# Patient Record
Sex: Female | Born: 1990 | Race: Black or African American | Hispanic: No | Marital: Married | Smoking: Former smoker
Health system: Southern US, Community
[De-identification: ages and names within clinical notes are randomized; demographics above are authoritative.]

## PROBLEM LIST (undated history)

## (undated) DIAGNOSIS — L659 Nonscarring hair loss, unspecified: Secondary | ICD-10-CM

## (undated) DIAGNOSIS — G43909 Migraine, unspecified, not intractable, without status migrainosus: Secondary | ICD-10-CM

## (undated) HISTORY — DX: Migraine, unspecified, not intractable, without status migrainosus: G43.909

## (undated) HISTORY — PX: OTHER SURGICAL HISTORY: SHX169

## (undated) HISTORY — DX: Nonscarring hair loss, unspecified: L65.9

## (undated) HISTORY — PX: NO PAST SURGERIES: SHX2092

---
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
## 2008-01-13 ENCOUNTER — Encounter: Admission: RE | Admit: 2008-01-13 | Discharge: 2008-01-13 | Payer: Self-pay | Admitting: Family Medicine

## 2008-01-13 IMAGING — CR DG ABDOMEN 1V
2 series · 2 of 2 positions shown · non-contrast
Comparison: None

CLINICAL DATA: Abdominal pain and constipation

ABDOMEN - 1 VIEW

[t abdomen supine (1 of 2)]
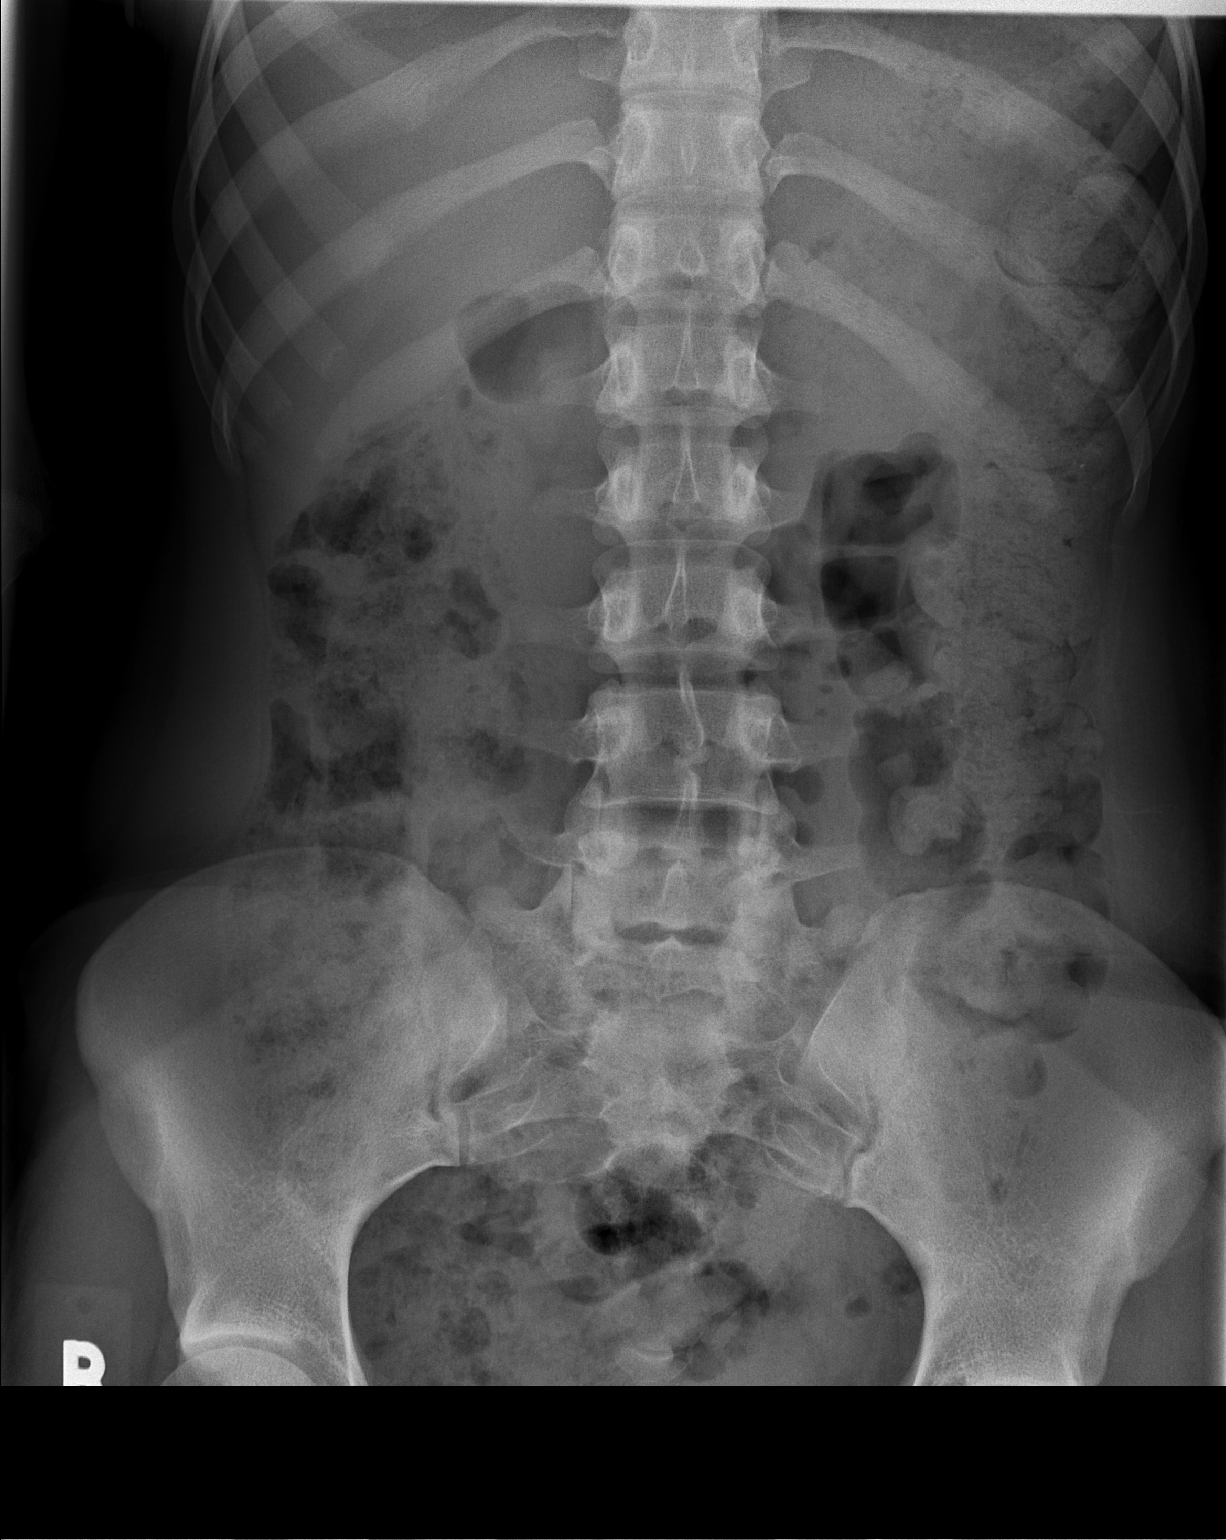

[t abdomen supine (2 of 2)]
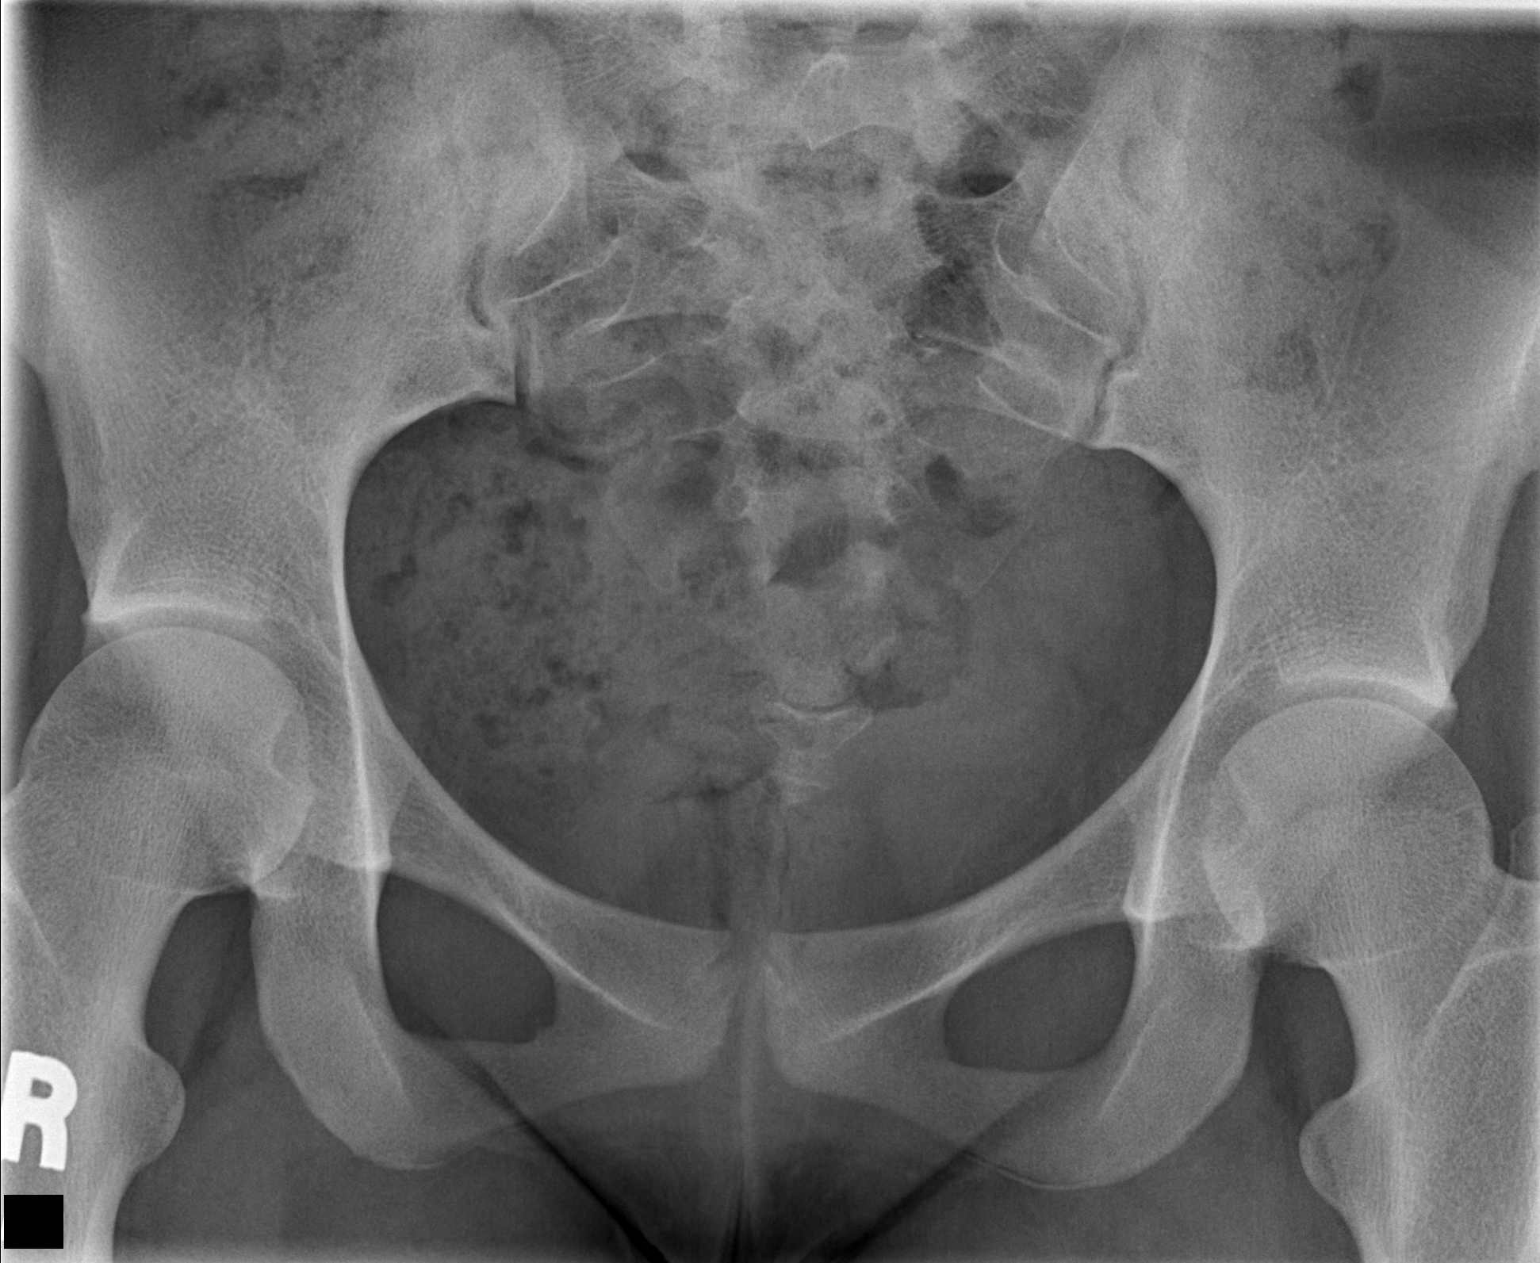

[2 of 2 positions shown; findings below may reference images not displayed]

FINDINGS: There is a moderate fecal burden identified within the
colon.

No abnormally dilated loops of small bowel or air-fluid levels
noted.

No radiopaque foreign bodies or soft tissue calcifications noted.
IMPRESSION: .
1.  Moderate fecal burden within the colon correlates with the
clinical history of constipation.
2.  No evidence for bowel obstruction

## 2009-03-29 ENCOUNTER — Inpatient Hospital Stay (HOSPITAL_COMMUNITY): Admission: AD | Admit: 2009-03-29 | Discharge: 2009-03-29 | Payer: Self-pay | Admitting: Obstetrics & Gynecology

## 2009-04-16 LAB — HM PAP SMEAR: HM Pap smear: NORMAL

## 2009-10-05 ENCOUNTER — Emergency Department (HOSPITAL_COMMUNITY): Admission: EM | Admit: 2009-10-05 | Discharge: 2009-10-05 | Payer: Self-pay | Admitting: Family Medicine

## 2010-01-14 ENCOUNTER — Inpatient Hospital Stay (HOSPITAL_COMMUNITY): Admission: AD | Admit: 2010-01-14 | Discharge: 2010-01-15 | Payer: Self-pay | Admitting: Obstetrics & Gynecology

## 2010-01-14 ENCOUNTER — Ambulatory Visit: Payer: Self-pay | Admitting: Obstetrics & Gynecology

## 2010-01-14 IMAGING — US US OB COMP LESS 14 WK
1 series · 13 of 28 positions shown · non-contrast
Comparison: None

CLINICAL DATA: Early pregnancy, bleeding, cramping; quantitative
beta HCG [3Q]

OBSTETRIC <14 WK US AND TRANSVAGINAL OB US
TECHNIQUE: Both transabdominal and transvaginal ultrasound
examinations were performed for complete evaluation of the
gestation as well as the maternal uterus, adnexal regions, and
pelvic cul-de-sac.  Transvaginal technique was performed to assess
early pregnancy.

[Series 1: us ob comp less 14 wks · 0.19mm/px · 13 of 41 slices shown]
[im 2/41]
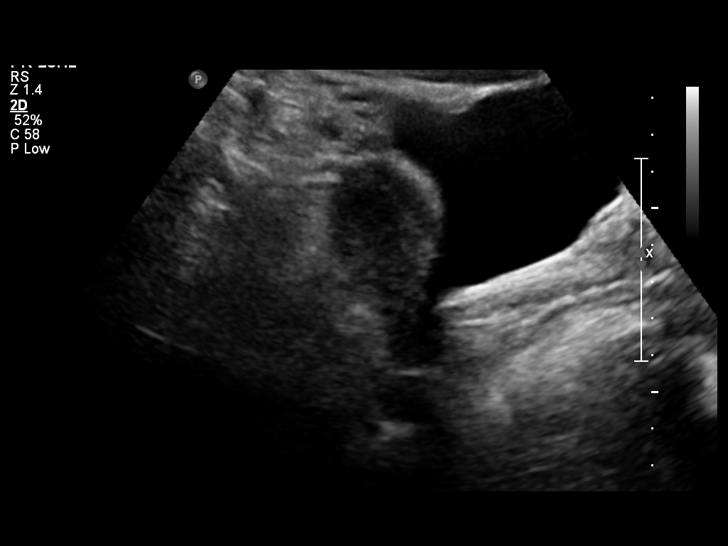
[im 5/41]
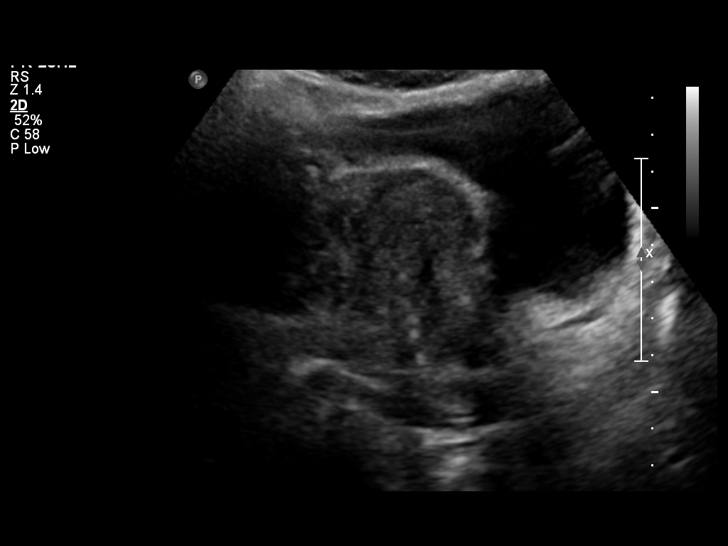
[im 8/41]
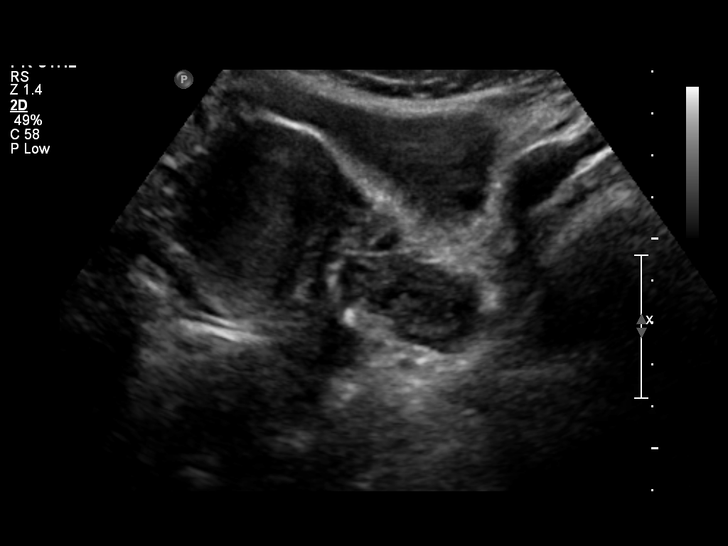
[im 11/41]
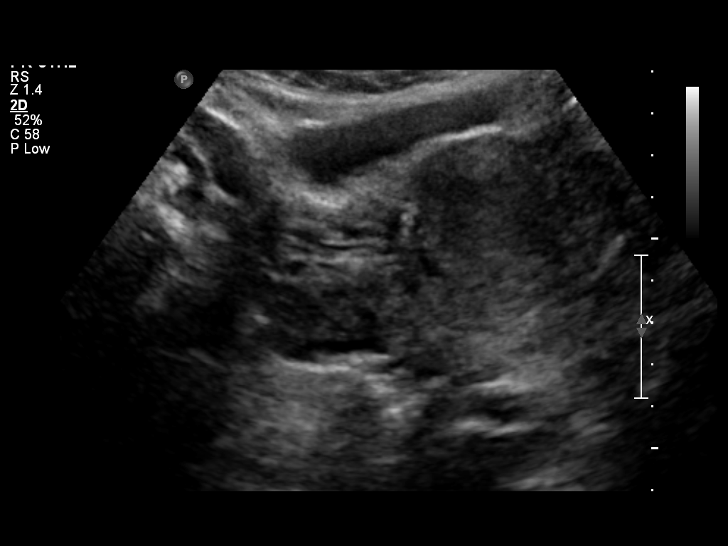
[im 14/41]
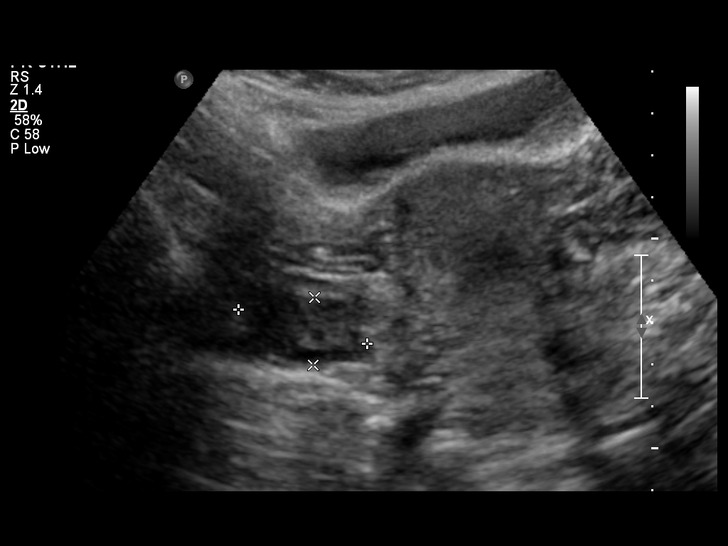
[im 17/41]
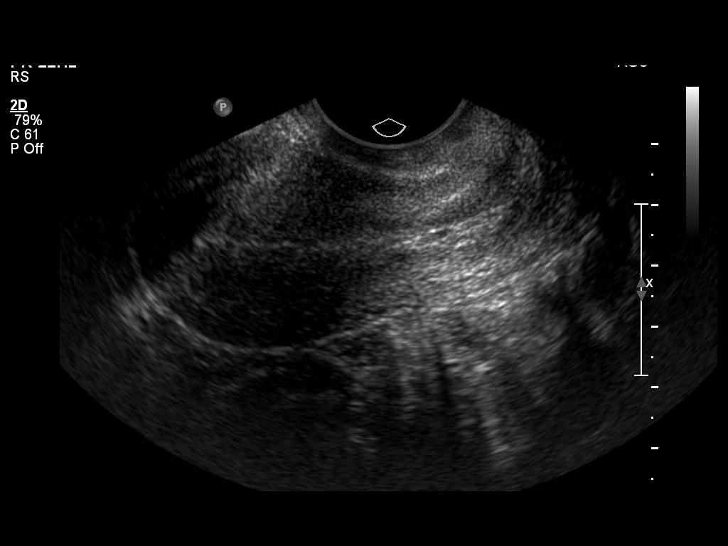
[im 21/41]
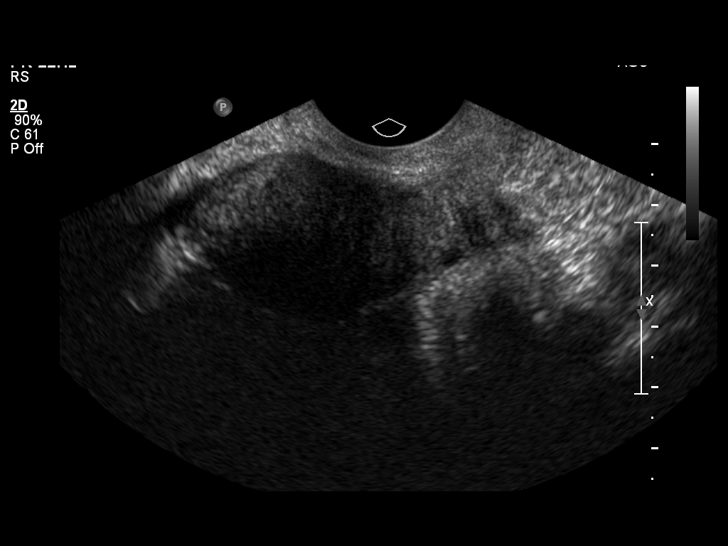
[im 24/41]
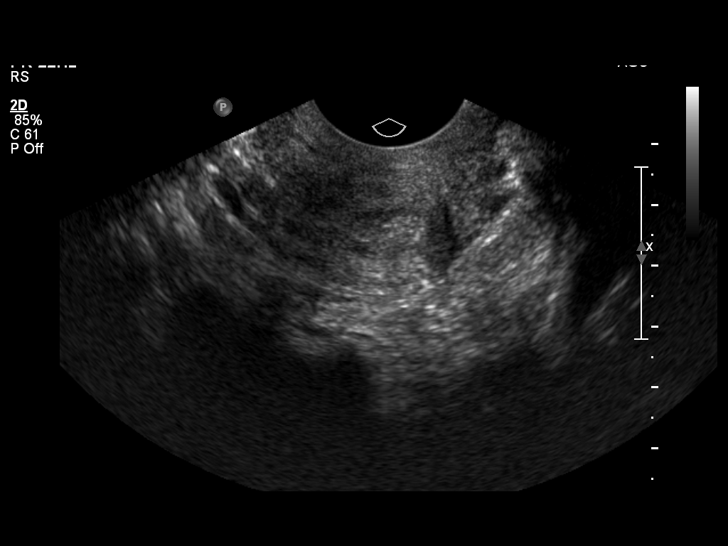
[im 27/41]
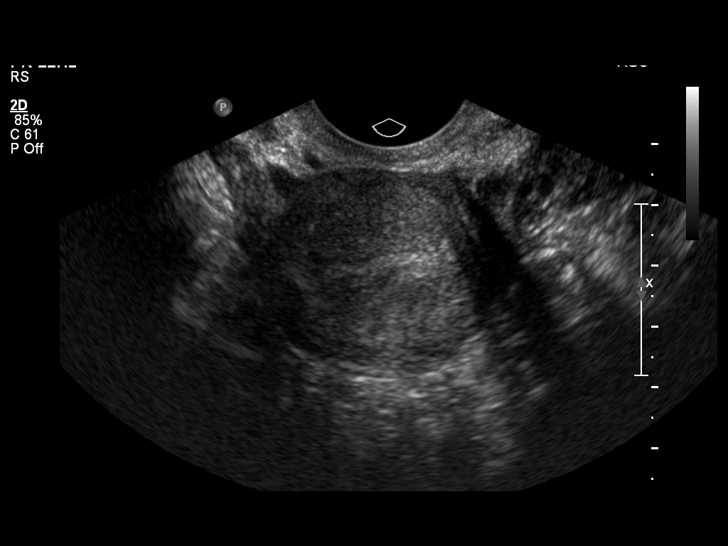
[im 30/41]
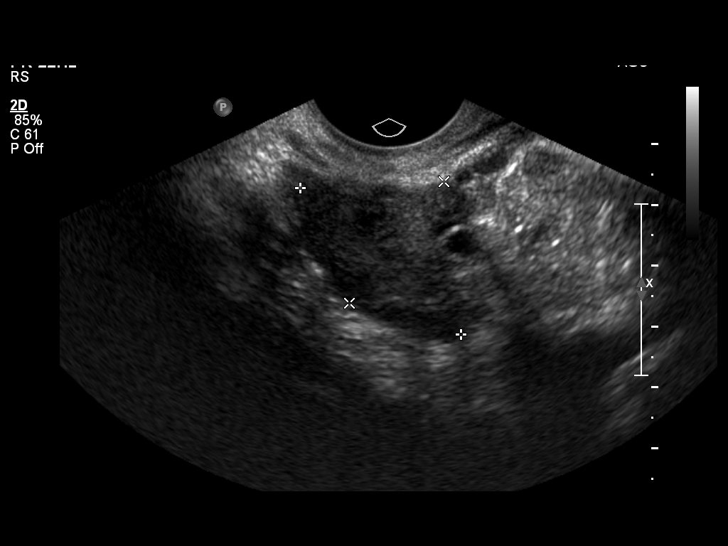
[im 33/41]
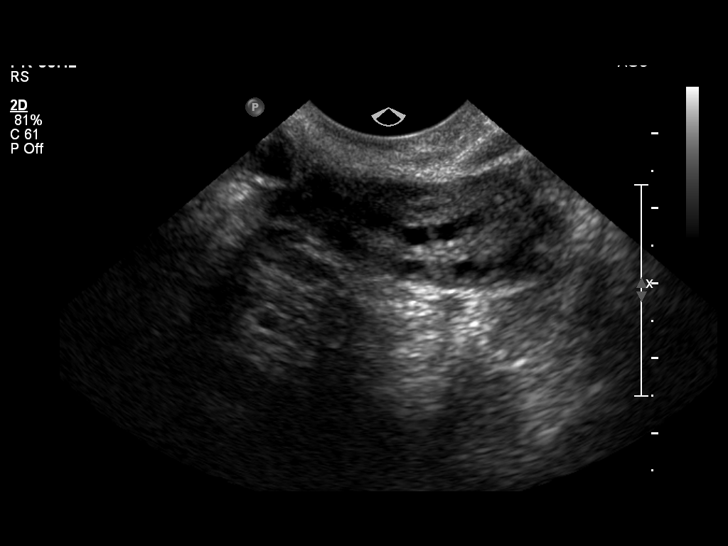
[im 36/41]
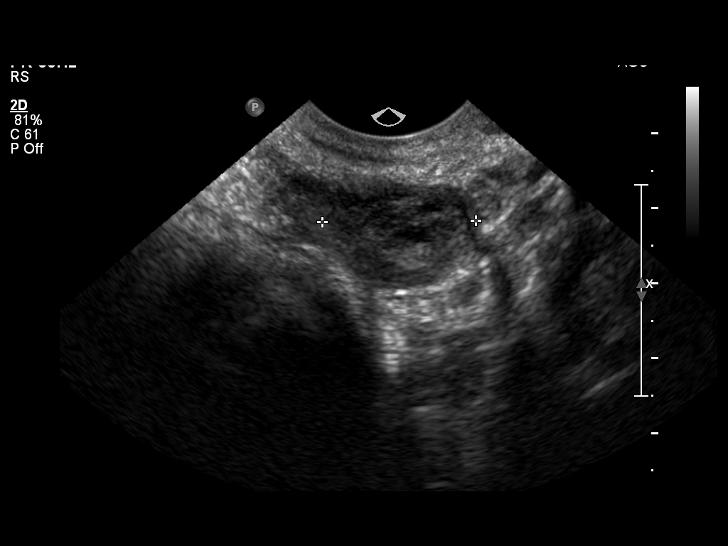
[im 39/41]
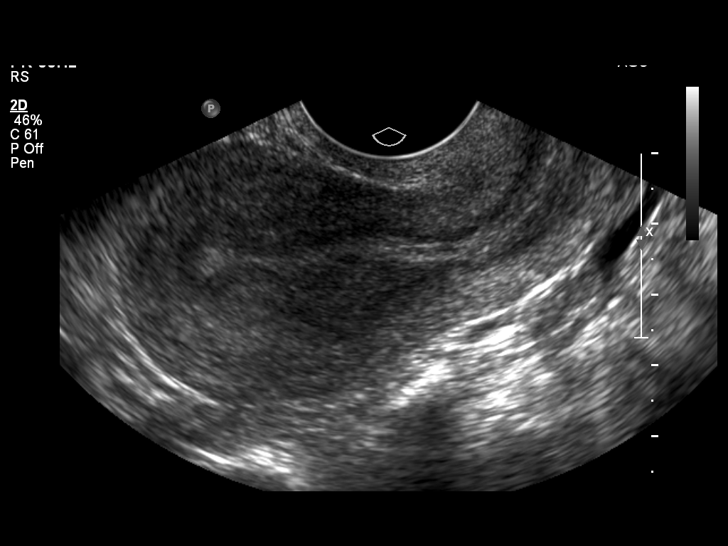

[13 of 28 positions shown; findings below may reference images not displayed]

Intrauterine gestational sac:  Not identified
Yolk sac: Not identify
Embryo: None not identified
Cardiac Activity: N/A
Heart Rate: N/A bpm

Maternal uterus/adnexae:
Endometrial complex 5 mm thick.
Small amount of hypoechoic material within endometrial stripe,
question blood.
No gestational sac identified.
Left ovary normal size and morphology, 3.3 x 1.5 x 2.1 cm.
Right ovary normal size and morphology, 3.6 x 2.5 x 2.2 cm.
No adnexal masses.
Trace free pelvic fluid.
IMPRESSION: No intrauterine gestation identified.
With a quantitative beta HCG level of [3Q], differential diagnosis
includes spontaneous abortion and ectopic pregnancy.
Serial quantitative beta HCG and/or follow-up ultrasound
recommended to exclude ectopic pregnancy.

Findings called to [facility] in [facility] on [DATE] at [3Q] hours.

## 2010-01-17 ENCOUNTER — Ambulatory Visit: Payer: Self-pay | Admitting: Obstetrics & Gynecology

## 2010-01-17 ENCOUNTER — Inpatient Hospital Stay (HOSPITAL_COMMUNITY): Admission: AD | Admit: 2010-01-17 | Discharge: 2010-01-17 | Payer: Self-pay | Admitting: Obstetrics & Gynecology

## 2010-02-14 DEATH — deceased

## 2010-04-16 ENCOUNTER — Emergency Department (HOSPITAL_COMMUNITY)
Admission: EM | Admit: 2010-04-16 | Discharge: 2010-04-17 | Payer: Self-pay | Source: Home / Self Care | Admitting: Emergency Medicine

## 2010-04-17 IMAGING — US US ART/VEN ABD/PELV/SCROTUM DOPPLER COMPLETE
1 series · 13 of 25 positions shown · non-contrast
Comparison: None.

CLINICAL DATA: Left-sided pelvic pain.  Clinical concern for
ovarian torsion.

TRANSABDOMINAL AND TRANSVAGINAL ULTRASOUND OF PELVIS
DOPPLER ULTRASOUND OF OVARIES
TECHNIQUE: Both transabdominal and transvaginal ultrasound
examinations of the pelvis were performed including evaluation of
the uterus, ovaries, adnexal regions, and pelvic cul-de-sac.
Transabdominal technique was performed for global imaging of the
pelvis.  Transvaginal technique was performed for detailed
evaluation of the endometrium and/or ovaries.  Color and duplex
Doppler ultrasound was utilized to evaluate blood flow to the
ovaries.

[Series 1: us art/ven abd/pelv/scrotum doppler complete · 0.23mm/px · 13 of 58 slices shown]
[im 1/58]
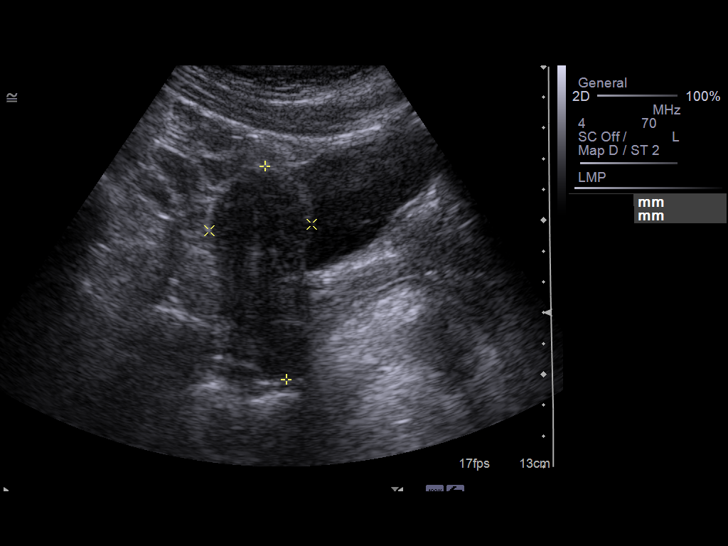
[im 5/58]
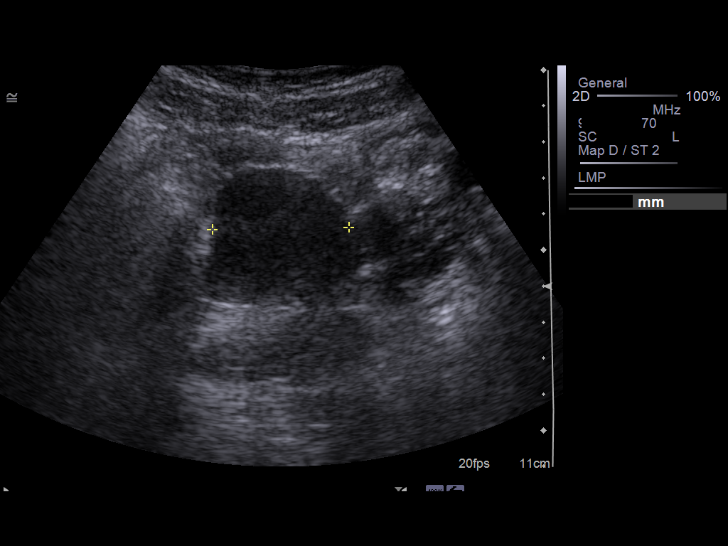
[im 10/58]
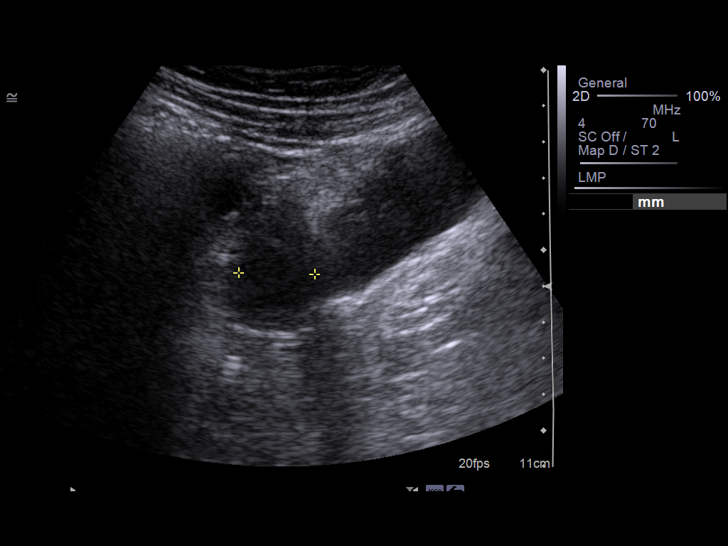
[im 15/58]
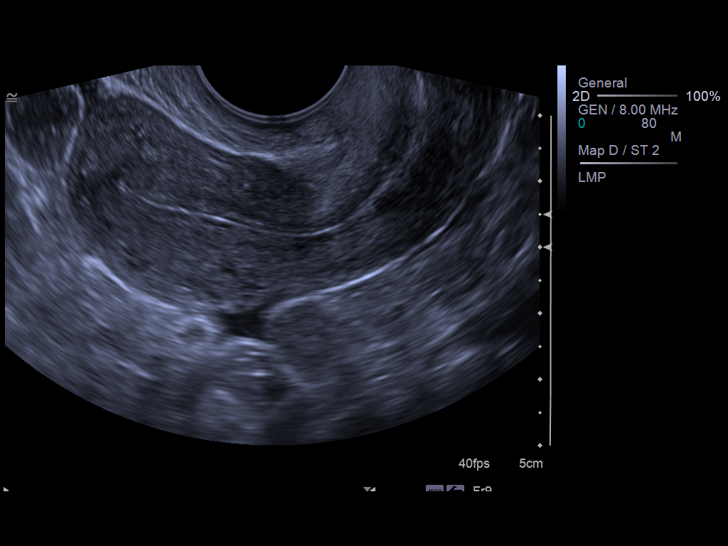
[im 20/58]
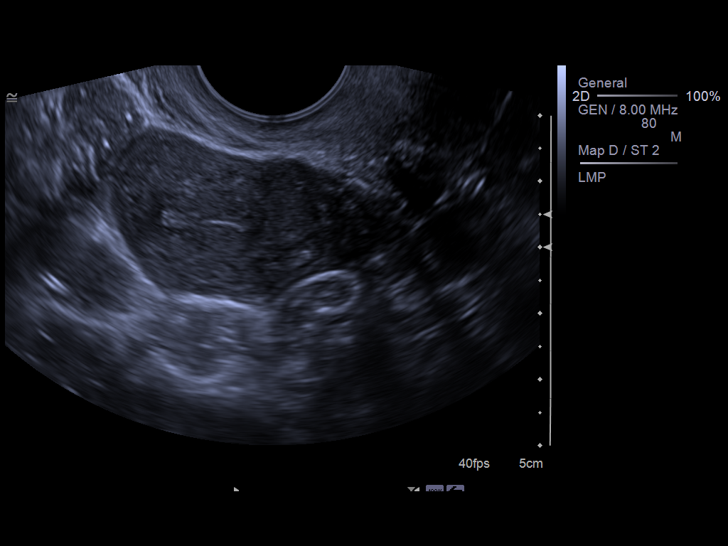
[im 24/58]
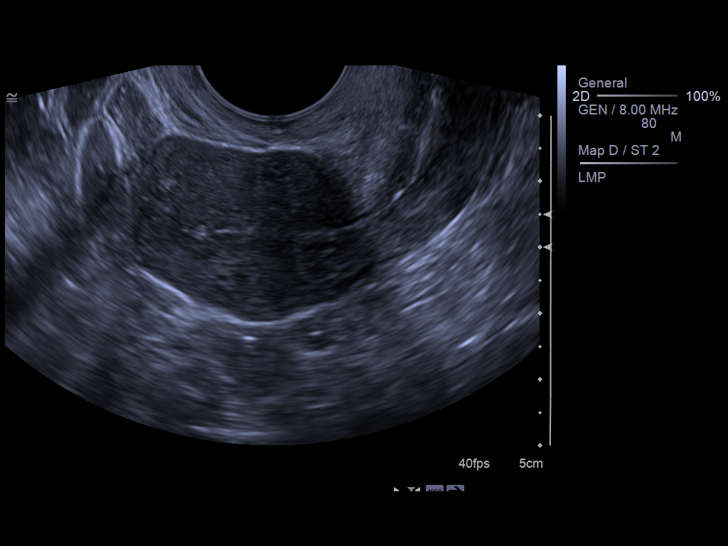
[im 29/58]
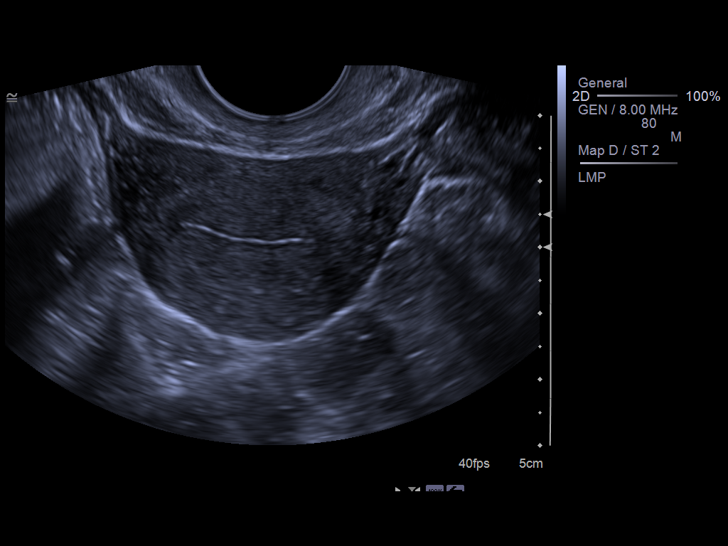
[im 34/58]
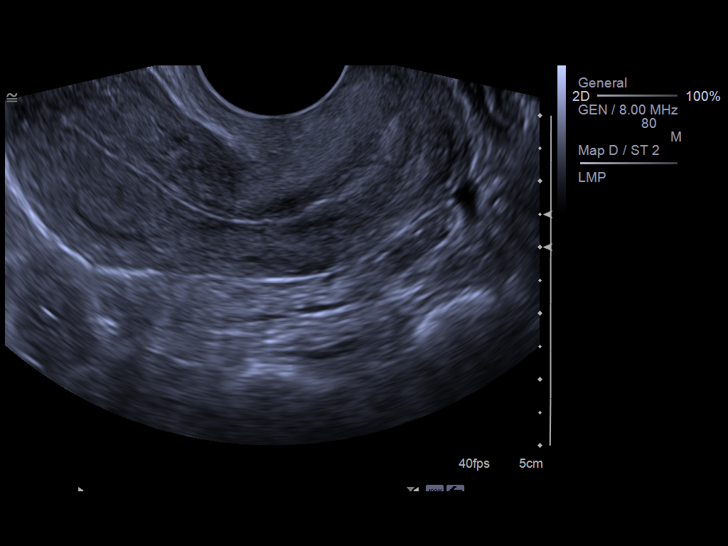
[im 39/58]
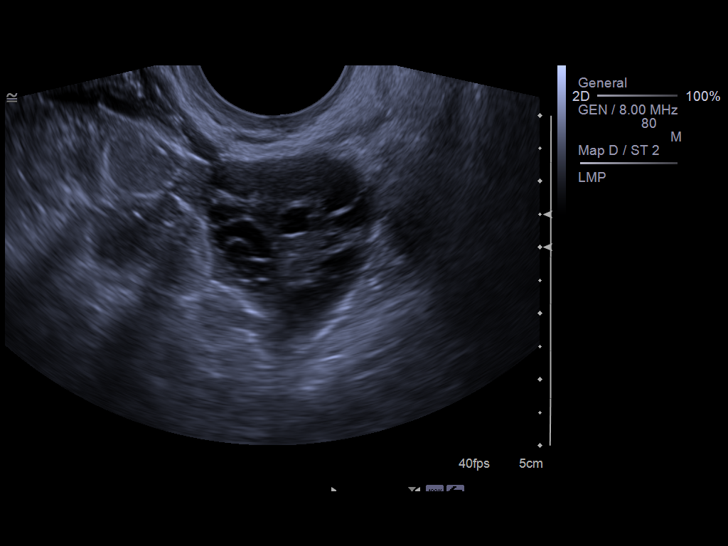
[im 43/58]
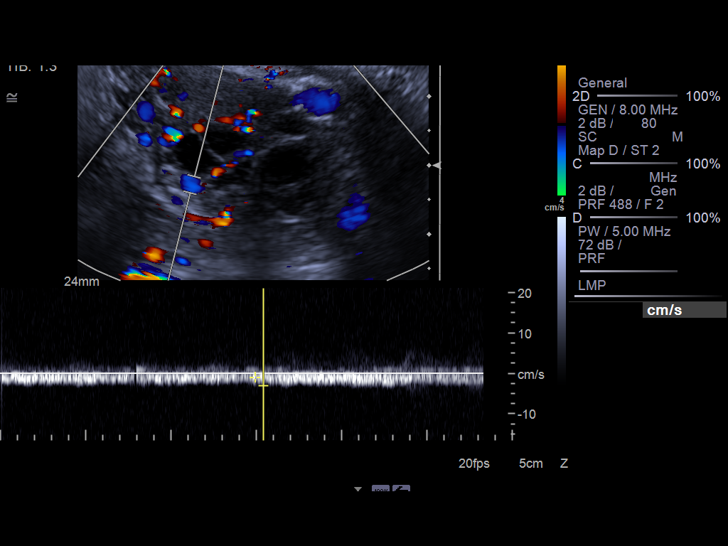
[im 48/58]
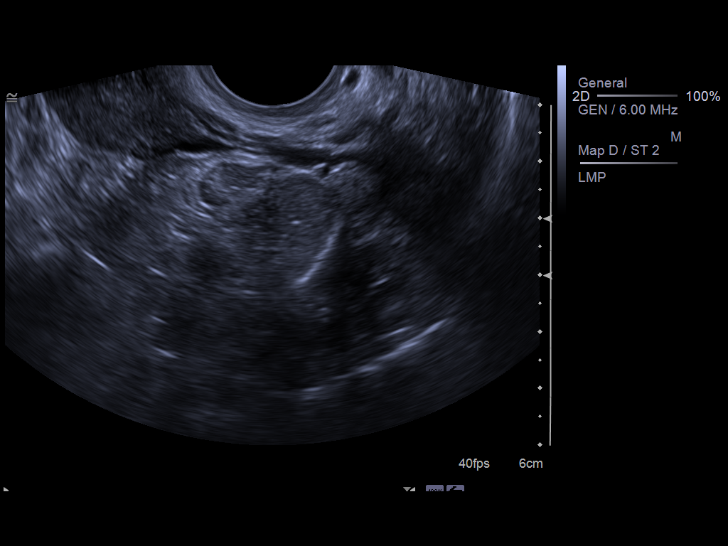
[im 53/58]
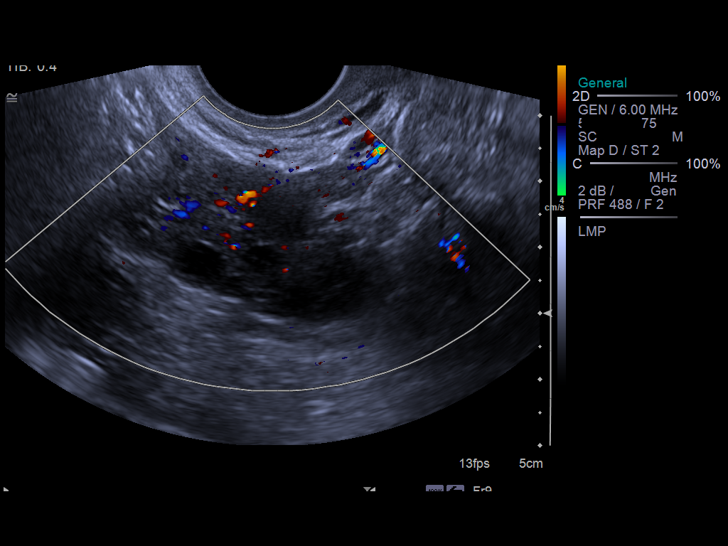
[im 58/58]
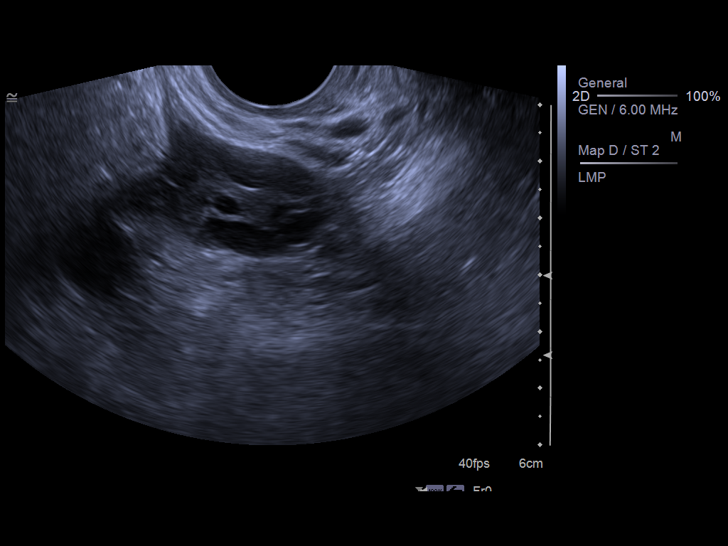

[13 of 25 positions shown; findings below may reference images not displayed]

FINDINGS: Uterus 7.0 x 4.0 x 2.8 cm.  Anteverted, anteflexed.  No focal
abnormality.

Endometrium 1 mm.  Uniformly thin and echogenic.

Right Ovary 3.6 x 2.2 x 1.7 cm.  Normal.  Low resistance arterial
and venous wave forms are noted on pulsed Doppler interrogation.

Left Ovary 2.4 x 2.9 x 2.5 cm.  Normal.  Low resistance arterial
and venous wave forms are identified on pulsed Doppler
interrogation.

Other Findings:  Small free fluid incidentally noted in the cul-de-
sac.
IMPRESSION: Normal exam.  No sonographic evidence for ovarian torsion.

## 2010-04-19 ENCOUNTER — Emergency Department (HOSPITAL_COMMUNITY)
Admission: EM | Admit: 2010-04-19 | Discharge: 2010-04-19 | Payer: Self-pay | Source: Home / Self Care | Admitting: Emergency Medicine

## 2010-05-30 ENCOUNTER — Other Ambulatory Visit: Payer: Self-pay | Admitting: Obstetrics and Gynecology

## 2010-06-26 LAB — URINALYSIS, ROUTINE W REFLEX MICROSCOPIC
Bilirubin Urine: NEGATIVE
Ketones, ur: NEGATIVE mg/dL
Leukocytes, UA: NEGATIVE
Nitrite: NEGATIVE
Protein, ur: NEGATIVE mg/dL
Specific Gravity, Urine: 1.011 (ref 1.005–1.030)
Urobilinogen, UA: 1 mg/dL (ref 0.0–1.0)
pH: 7.5 (ref 5.0–8.0)

## 2010-06-26 LAB — URINE MICROSCOPIC-ADD ON

## 2010-06-26 LAB — PREGNANCY, URINE: Preg Test, Ur: NEGATIVE

## 2010-06-29 LAB — URINALYSIS, ROUTINE W REFLEX MICROSCOPIC
Bilirubin Urine: NEGATIVE
Glucose, UA: NEGATIVE mg/dL
Ketones, ur: 15 mg/dL — AB
Leukocytes, UA: NEGATIVE
Nitrite: NEGATIVE
Protein, ur: NEGATIVE mg/dL
Specific Gravity, Urine: 1.025 (ref 1.005–1.030)
Urobilinogen, UA: 0.2 mg/dL (ref 0.0–1.0)
pH: 6 (ref 5.0–8.0)

## 2010-06-29 LAB — WET PREP, GENITAL
Clue Cells Wet Prep HPF POC: NONE SEEN
Trich, Wet Prep: NONE SEEN
Yeast Wet Prep HPF POC: NONE SEEN

## 2010-06-29 LAB — URINE MICROSCOPIC-ADD ON

## 2010-06-29 LAB — CBC
HCT: 36 % (ref 36.0–46.0)
Hemoglobin: 12 g/dL (ref 12.0–15.0)
MCH: 29.4 pg (ref 26.0–34.0)
MCHC: 33.3 g/dL (ref 30.0–36.0)
MCV: 88.3 fL (ref 78.0–100.0)
Platelets: 206 10*3/uL (ref 150–400)
RBC: 4.08 MIL/uL (ref 3.87–5.11)
RDW: 12.6 % (ref 11.5–15.5)
WBC: 4.9 10*3/uL (ref 4.0–10.5)

## 2010-06-29 LAB — GC/CHLAMYDIA PROBE AMP, GENITAL
Chlamydia, DNA Probe: NEGATIVE
GC Probe Amp, Genital: NEGATIVE

## 2010-06-29 LAB — HCG, QUANTITATIVE, PREGNANCY
hCG, Beta Chain, Quant, S: 234 m[IU]/mL — ABNORMAL HIGH (ref <5)
hCG, Beta Chain, Quant, S: 3368 m[IU]/mL — ABNORMAL HIGH (ref <5)

## 2010-06-29 LAB — ABO/RH: ABO/RH(D): O POS

## 2010-06-29 LAB — POCT PREGNANCY, URINE: Preg Test, Ur: POSITIVE

## 2010-07-02 LAB — POCT URINALYSIS DIP (DEVICE)
Bilirubin Urine: NEGATIVE
Glucose, UA: NEGATIVE mg/dL
Ketones, ur: NEGATIVE mg/dL
Nitrite: NEGATIVE
Protein, ur: NEGATIVE mg/dL
Specific Gravity, Urine: <=1.005 (ref 1.005–1.030)
Urobilinogen, UA: 1 mg/dL (ref 0.0–1.0)
pH: 6 (ref 5.0–8.0)

## 2010-07-02 LAB — POCT RAPID STREP A (OFFICE): Streptococcus, Group A Screen (Direct): NEGATIVE

## 2010-08-20 ENCOUNTER — Emergency Department (HOSPITAL_COMMUNITY)
Admission: EM | Admit: 2010-08-20 | Discharge: 2010-08-20 | Disposition: A | Discharge status: Home / Self Care | Payer: BC Managed Care – PPO | Attending: Emergency Medicine | Admitting: Emergency Medicine

## 2010-08-20 DIAGNOSIS — R209 Unspecified disturbances of skin sensation: Secondary | ICD-10-CM | POA: Insufficient documentation

## 2011-03-01 DIAGNOSIS — L6681 Central centrifugal cicatricial alopecia: Secondary | ICD-10-CM | POA: Insufficient documentation

## 2011-03-01 DIAGNOSIS — L668 Other cicatricial alopecia: Secondary | ICD-10-CM | POA: Insufficient documentation

## 2011-03-01 DIAGNOSIS — L089 Local infection of the skin and subcutaneous tissue, unspecified: Secondary | ICD-10-CM | POA: Insufficient documentation

## 2011-07-07 ENCOUNTER — Encounter (HOSPITAL_COMMUNITY): Payer: Self-pay | Admitting: Emergency Medicine

## 2011-07-07 ENCOUNTER — Emergency Department (HOSPITAL_COMMUNITY)
Admission: EM | Admit: 2011-07-07 | Discharge: 2011-07-07 | Disposition: A | Discharge status: Home / Self Care | Payer: BC Managed Care – PPO | Source: Home / Self Care | Attending: Family Medicine | Admitting: Family Medicine

## 2011-07-07 DIAGNOSIS — M545 Low back pain: Secondary | ICD-10-CM

## 2011-07-07 LAB — POCT URINALYSIS DIP (DEVICE)
Glucose, UA: NEGATIVE mg/dL
Leukocytes, UA: NEGATIVE
Nitrite: NEGATIVE
Protein, ur: 30 mg/dL — AB
Specific Gravity, Urine: 1.025 (ref 1.005–1.030)
Urobilinogen, UA: 2 mg/dL — ABNORMAL HIGH (ref 0.0–1.0)
pH: 6 (ref 5.0–8.0)

## 2011-07-07 LAB — POCT PREGNANCY, URINE: Preg Test, Ur: NEGATIVE

## 2011-07-07 MED ORDER — NAPROXEN 375 MG PO TABS: 375.0000 mg | ORAL_TABLET | Freq: Two times a day (BID) | ORAL | Status: DC

## 2011-07-07 NOTE — ED Provider Notes (Signed)
History     CSN: [id number]  Arrival date & time 07/07/11  2536   First MD Initiated Contact with Patient 07/07/11 [phone]      Chief Complaint  Patient presents with  . Back Pain    (Consider location/radiation/quality/duration/timing/severity/associated sxs/prior treatment) HPI Comments: Elizabeth Williamson presents for evaluation of acute low back pain over the last 3 days. She denies any injury. 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 She denies any numbness, tingling, weakness, or bowel or bladder incontinence. She describes the pain as dull and achy and localized to her mid lower back. It is not exacerbated nor improved by anything. She is not taking any medication for this.  Patient is a 21 y.o. female presenting with back pain. The history is provided by the patient.  Back Pain  This is a new problem. The current episode started more than 2 days ago. The problem occurs constantly. The problem has not changed since onset.The pain is associated with no known injury. The pain is present in the lumbar spine. The quality of the pain is described as aching. The pain does not radiate. The pain is moderate. The symptoms are aggravated by bending and certain positions. The pain is the same all the time. Pertinent negatives include no numbness, no bowel incontinence, no bladder incontinence, no paresthesias, no paresis, no tingling and no weakness. She has tried nothing for the symptoms.    History reviewed. No pertinent past medical history.  History reviewed. No pertinent past surgical history.  History reviewed. No pertinent family history.  History  Substance Use Topics  . Smoking status: Current Some Day Smoker    Types: Cigarettes  . Smokeless tobacco: Not on file  . Alcohol Use: Yes     Occasional social drinker    OB History    Grav Para Term Preterm Abortions TAB SAB Ect Mult Living                  Review of Systems  Constitutional: Negative.   HENT:  Negative.   Eyes: Negative.   Respiratory: Negative.   Cardiovascular: Negative.   Gastrointestinal: Negative.  Negative for bowel incontinence.  Genitourinary: Negative.  Negative for bladder incontinence.  Musculoskeletal: Positive for back pain.  Skin: Negative.   Neurological: Negative.  Negative for tingling, weakness, numbness and paresthesias.    Allergies  Review of patient's allergies indicates no known allergies.  Home Medications   Current Outpatient Rx  Name Route Sig Dispense Refill  . MEDROXYPROGESTERONE ACETATE 150 MG/ML IM SUSP Intramuscular Inject 150 mg into the muscle every 3 (three) months.    Marland Kitchen NAPROXEN 375 MG PO TABS Oral Take 1 tablet (375 mg total) by mouth 2 (two) times daily. 20 tablet 0    BP 135/87  Pulse 117  Temp(Src) 98.6 F (37 C) (Oral)  Resp 20  SpO2 97%  Physical Exam  Nursing note and vitals reviewed. Constitutional: She is oriented to person, place, and time. She appears well-developed and well-nourished.  HENT:  Head: Normocephalic and atraumatic.  Eyes: EOM are normal.  Neck: Normal range of motion.  Pulmonary/Chest: Effort normal.  Musculoskeletal: Normal range of motion.       Lumbar back: She exhibits tenderness, bony tenderness and pain.       Back: negative straight leg raise bilaterally, negative Fabers bilaterally, no pain with internal or external rotation bilaterally, full flexion, extension, abduction, and adduction; 5/5 strength  Neurological: She is  alert and oriented to person, place, and time.  Skin: Skin is warm and dry.  Psychiatric: Her behavior is normal.    ED Course  Procedures (including critical care time)  Labs Reviewed  POCT URINALYSIS DIP (DEVICE) - Abnormal; Notable for the following:    Bilirubin Urine SMALL (*)    Ketones, ur TRACE (*)    Hgb urine dipstick TRACE (*)    Protein, ur 30 (*)    Urobilinogen, UA 2.0 (*)    All other components within normal limits  POCT PREGNANCY, URINE   No  results found.   1. Low back pain       MDM  Exam unremarkable; rx given for naprosyn, exercises; labs reviewed        Renaee Munda, MD 07/07/11 1109

## 2011-07-07 NOTE — ED Notes (Addendum)
Pt presents with low back pain, states that it started 3 days ago. Pt denies pain with urination or vaginal discharge. Patient states to have never had this problem before. She describes the pain as being intermittent and aching.  States pain to be 5/10. No other problems noted pt AAx4 and NAD.

## 2011-07-07 NOTE — Discharge Instructions (Signed)
Take medications as directed. Use mild heat (heating pad, warm baths, etc) for 10 to 15 minutes, two to three times daily, as needed and as tolerated, taking care to not burn the skin. Begin stretches and exercises, as instructed in handouts, after 48 hours. Return to care should your symptoms not improve, or worsen in any way, such as numbness, weakness, or tingling, or inability to control urine or bowel movements.   

## 2011-09-03 ENCOUNTER — Emergency Department (HOSPITAL_COMMUNITY): Admission: EM | Admit: 2011-09-03 | Discharge: 2011-09-03 | Payer: BC Managed Care – PPO | Source: Home / Self Care

## 2011-09-03 MED ORDER — LORAZEPAM 2 MG/ML IJ SOLN: 1.0000 mg | Freq: Once | INTRAMUSCULAR | Status: DC

## 2011-09-03 NOTE — ED Notes (Signed)
Pt called x 3 no answer 

## 2011-09-03 NOTE — ED Notes (Addendum)
Pt called x2. No answer.  

## 2011-09-03 NOTE — ED Notes (Signed)
Pt called x 1 no answer

## 2012-03-06 ENCOUNTER — Encounter: Payer: Self-pay | Admitting: Family Medicine

## 2012-03-06 ENCOUNTER — Other Ambulatory Visit (HOSPITAL_COMMUNITY)
Admission: RE | Admit: 2012-03-06 | Discharge: 2012-03-06 | Disposition: A | Discharge status: Home / Self Care | Payer: BC Managed Care – PPO | Source: Ambulatory Visit | Attending: Family Medicine | Admitting: Family Medicine

## 2012-03-06 ENCOUNTER — Ambulatory Visit (INDEPENDENT_AMBULATORY_CARE_PROVIDER_SITE_OTHER): Payer: BC Managed Care – PPO | Admitting: Family Medicine

## 2012-03-06 VITALS — BP 102/72 | HR 80 | Ht 66.25 in | Wt 158.0 lb

## 2012-03-06 DIAGNOSIS — Z01419 Encounter for gynecological examination (general) (routine) without abnormal findings: Secondary | ICD-10-CM | POA: Insufficient documentation

## 2012-03-06 DIAGNOSIS — Z0000 Encounter for general adult medical examination without abnormal findings: Secondary | ICD-10-CM

## 2012-03-06 DIAGNOSIS — N926 Irregular menstruation, unspecified: Secondary | ICD-10-CM

## 2012-03-06 DIAGNOSIS — Z113 Encounter for screening for infections with a predominantly sexual mode of transmission: Secondary | ICD-10-CM | POA: Insufficient documentation

## 2012-03-06 DIAGNOSIS — Z13220 Encounter for screening for lipoid disorders: Secondary | ICD-10-CM

## 2012-03-06 DIAGNOSIS — Z23 Encounter for immunization: Secondary | ICD-10-CM

## 2012-03-06 LAB — POCT URINALYSIS DIPSTICK
Bilirubin, UA: NEGATIVE
Blood, UA: NEGATIVE
Leukocytes, UA: NEGATIVE
Nitrite, UA: NEGATIVE
Protein, UA: NEGATIVE
Urobilinogen, UA: NEGATIVE
pH, UA: 6

## 2012-03-06 LAB — HM PAP SMEAR: HM Pap smear: NORMAL

## 2012-03-06 LAB — LIPID PANEL
HDL: 86 mg/dL (ref >39)
LDL Cholesterol: 101 mg/dL — ABNORMAL HIGH (ref 0–99)

## 2012-03-06 LAB — HIV ANTIBODY (ROUTINE TESTING W REFLEX): HIV: NONREACTIVE

## 2012-03-06 LAB — POCT URINE PREGNANCY: Preg Test, Ur: NEGATIVE

## 2012-03-06 NOTE — Progress Notes (Signed)
Chief Complaint  Patient presents with  . Annual Exam    new patient annual exam with pap. Has form for [facility] to be filled out. [employer] employee, she will get flu shot at work. UA showed 2+ ketones. (6 hours fasting)   Elizabeth Williamson is a 21 y.o. female who presents for a complete physical.  She has the following concerns:  Periods haven't become regular yet since stopping Depo Provera. Last injection was in April.  LMP was 11/1.  Condom broke around 11/7.  Didn't use plan B.  Asking for pregnancy test today. She brings in forms to be filled out--which require immunizations history which isn't available yet.   There is no immunization history on file for this patient. She reports having Tdap through [facility], and PPD last December.  Only immunizations available are Hep B series.  Hasn't gotten flu shot yet this year. Last Pap smear: 2012 Last mammogram: n/a Last colonoscopy: n/a Last DEXA: n/a Dentist: twice yearly Ophtho: yearly; wears glasses Exercise:  2-3x/week Zumba , walking.  Past Medical History  Diagnosis Date  . Migraine     rare  . Alopecia     getting injections through dermatologist    History reviewed. No pertinent past surgical history.  History   Social History  . Marital Status: Single    Spouse Name: N/A    Number of Children: N/A  . 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 Smoking status: Current Some Day Smoker    Types: Cigarettes  . Smokeless tobacco: Not on file     Comment: about twice a week, when leaving work.  cutting back from 1 pack/week  . Alcohol Use: Yes     Comment: Occasional social drinker  . Drug Use: No  . Sexually Active: Yes -- Female partner(s)    Birth Control/ Protection: Condom   Other Topics Concern  . 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  Lives with her mother,  brother, sister and nephew.  No pets.  No passive tobacco exposure    Family History  Problem Relation Age of Onset  . Heart disease Brother     recently found to have enlarged heart (age 58)  . Diabetes Maternal Grandmother   . Cancer Maternal Grandfather     colon cancer (60's)  . Hypertension Paternal Grandmother     Current outpatient prescriptions:BIOTIN PO, Take 1 tablet by mouth daily., Disp: , Rfl: ;  Multiple Vitamins-Minerals (CENTRUM ULTRA WOMENS PO), Take 1 tablet by mouth daily., Disp: , Rfl:   No Known Allergies  ROS: The patient denies anorexia, fever, weight changes, headaches,  vision changes, decreased hearing, ear pain, sore throat, breast concerns, chest pain, palpitations, dizziness, syncope, dyspnea on exertion, cough, swelling, nausea, vomiting, diarrhea, abdominal pain, melena, hematochezia, indigestion/heartburn, hematuria, incontinence, dysuria,  vaginal discharge, odor or itch, genital lesions, joint pains, numbness, tingling, weakness, tremor, suspicious skin lesions, depression, anxiety, abnormal bleeding/bruising, or enlarged lymph nodes. Numbness in L arm, evaluated 2 yrs ago by MD and told everything was okay.  Still occasionally happens.  Denies any weakness, just tingling. Tingling is mainly in hand, 2-5th fingers (thumb not affected).  Denies elbow or neck pain.  Constant, not with activities. +irregular menses. Occasional constipation  PHYSICAL EXAM: BP 102/72  Pulse 80  Ht  5' 6.25" (1.683 m)  Wt 158 lb (71.668 kg)  BMI 25.31 kg/m2  LMP 02/15/2012  General Appearance:    Alert, cooperative, no distress, appears stated age  Head:    Normocephalic, without obvious abnormality, atraumatic  Eyes:    PERRL, conjunctiva/corneas clear, EOM's intact, fundi    benign  Ears:    Normal TM's and external ear canals  Nose:   Nares normal, mucosa normal, no drainage or sinus   tenderness  Throat:   Lips, mucosa, and tongue normal; teeth and gums normal  Neck:    Supple, no lymphadenopathy;  thyroid:  no   enlargement/tenderness/nodules; no carotid   bruit or JVD  Back:    Spine nontender, no curvature, ROM normal, no CVA     tenderness  Lungs:     Clear to auscultation bilaterally without wheezes, rales or     ronchi; respirations unlabored  Chest Wall:    No tenderness or deformity   Heart:    Regular rate and rhythm, S1 and S2 normal, no murmur, rub   or gallop  Breast Exam:    No tenderness, masses, or nipple discharge or inversion.      No axillary lymphadenopathy  Abdomen:     Soft, non-tender, nondistended, normoactive bowel sounds,    no masses, no hepatosplenomegaly  Genitalia:    Normal external genitalia without lesions.  BUS and vagina normal; cervix without lesions, or cervical motion tenderness. No abnormal vaginal discharge.  Uterus and adnexa not enlarged, nontender, no masses.  Pap performed  Rectal:    Not performed due to age<40 and no related complaints  Extremities:   No clubbing, cyanosis or edema  Pulses:   2+ and symmetric all extremities  Skin:   Skin color, texture, turgor normal, no rashes or lesions. +tattoos  Lymph nodes:   Cervical, supraclavicular, and axillary nodes normal  Neurologic:   CNII-XII intact, normal strength, sensation and gait; reflexes 2+ and symmetric throughout          Psych:   Normal mood, affect, hygiene and grooming.     ASSESSMENT/PLAN: 1. Routine general medical examination at a health care facility  Visual acuity screening, POCT Urinalysis Dipstick, Tympanometry, Cytology - PAP, HIV Antibody  2. Screening for lipoid disorders  Lipid panel  3. Need for prophylactic vaccination and inoculation against influenza  Flu vaccine greater than or equal to 3yo preservative free IM  4. Irregular periods  POCT urine pregnancy    If pregnancy test negative, it may be too early to detect--if she doesn't get period when expected next week, repeat a home test.  Likely NOT pregnant, given timing of  unrprotected sex being shortly after her period ended (<1 week into her cycle). Discussed plan B  Labs: Pap, GC, chlamydia, HIV Lipids (never had checked before). Flu shot  Form--need childhood immunizations-only have HepB in [place]. Form only requires 2 PPD within 12 months (not asking for 2-step).  Thinks she had PPD 03/2011.  She will get date from Employee health (and also of Tdap). Needs to return for PPD (since it is Thursday).  And if needed, will return for another one, if last PPD was >1 year ago.  Discussed monthly self breast exams and yearly mammograms after the age of 71; at least 30 minutes of aerobic activity at least 5 days/week; proper sunscreen use reviewed; healthy diet, including goals of calcium and vitamin D intake and alcohol recommendations (less than or equal to 1 drink/day) reviewed; regular  seatbelt use; changing batteries in smoke detectors.  Immunization recommendations discussed--flu shot given today.  Colonoscopy recommendations reviewed--age 34

## 2012-03-06 NOTE — Patient Instructions (Addendum)
HEALTH MAINTENANCE RECOMMENDATIONS:  It is recommended that you get at least 30 minutes of aerobic exercise at least 5 days/week (for weight loss, you may need as much as 60-90 minutes). This can be any activity that gets your heart rate up. This can be divided in 10-15 minute intervals if needed, but try and build up your endurance at least once a week.  Weight bearing exercise is also recommended twice weekly.  Eat a healthy diet with lots of vegetables, fruits and fiber.  "Colorful" foods have a lot of vitamins (ie green vegetables, tomatoes, red peppers, etc).  Limit sweet tea, regular sodas and alcoholic beverages, all of which has a lot of calories and sugar.  Up to 1 alcoholic drink daily may be beneficial for women (unless trying to lose weight, watch sugars).  Drink a lot of water.  Calcium recommendations are 1200-1500 mg daily (1500 mg for postmenopausal women or women without ovaries), and vitamin D 1000 IU daily.  This should be obtained from diet and/or supplements (vitamins), and calcium should not be taken all at once, but in divided doses.  Monthly self breast exams and yearly mammograms for women over the age of 6 is recommended.  Sunscreen of at least SPF 30 should be used on all sun-exposed parts of the skin when outside between the hours of 10 am and 4 pm (not just when at beach or pool, but even with exercise, golf, tennis, and yard work!)  Use a sunscreen that says "broad spectrum" so it covers both UVA and UVB rays, and make sure to reapply every 1-2 hours.  Remember to change the batteries in your smoke detectors when changing your clock times in the spring and fall.  Use your seat belt every time you are in a car, and please drive safely and not be distracted with cell phones and texting while driving.  Quit smoking.  Use 1-800-QUITNOW or [place]Quit[place] for free assistance. Remember about Plan B if needed in the future (if condom breaks/not used)  Please get records of  immunizations from employee health (PPD and TdaP) Please get [place] the rest of your childhood immunization records for your form to be completed.

## 2012-03-10 ENCOUNTER — Encounter: Payer: Self-pay | Admitting: Family Medicine

## 2012-04-10 ENCOUNTER — Emergency Department (HOSPITAL_COMMUNITY)
Admission: EM | Admit: 2012-04-10 | Discharge: 2012-04-10 | Disposition: A | Discharge status: Home / Self Care | Payer: BC Managed Care – PPO | Attending: Emergency Medicine | Admitting: Emergency Medicine

## 2012-04-10 ENCOUNTER — Encounter (HOSPITAL_COMMUNITY): Payer: Self-pay | Admitting: *Deleted

## 2012-04-10 DIAGNOSIS — R0789 Other chest pain: Secondary | ICD-10-CM | POA: Insufficient documentation

## 2012-04-10 DIAGNOSIS — F17200 Nicotine dependence, unspecified, uncomplicated: Secondary | ICD-10-CM | POA: Insufficient documentation

## 2012-04-10 DIAGNOSIS — Z872 Personal history of diseases of the skin and subcutaneous tissue: Secondary | ICD-10-CM | POA: Insufficient documentation

## 2012-04-10 DIAGNOSIS — Z8679 Personal history of other diseases of the circulatory system: Secondary | ICD-10-CM | POA: Insufficient documentation

## 2012-04-10 DIAGNOSIS — R05 Cough: Secondary | ICD-10-CM | POA: Insufficient documentation

## 2012-04-10 DIAGNOSIS — J45909 Unspecified asthma, uncomplicated: Secondary | ICD-10-CM | POA: Insufficient documentation

## 2012-04-10 DIAGNOSIS — R059 Cough, unspecified: Secondary | ICD-10-CM | POA: Insufficient documentation

## 2012-04-10 MED ORDER — ALBUTEROL (5 MG/ML) CONTINUOUS INHALATION SOLN
INHALATION_SOLUTION | RESPIRATORY_TRACT | Status: AC
  Administered 2012-04-10: 5 mg via RESPIRATORY_TRACT
  Filled 2012-04-10: qty 40

## 2012-04-10 MED ORDER — PREDNISONE 20 MG PO TABS: 60.0000 mg | ORAL_TABLET | Freq: Every day | ORAL | Status: DC

## 2012-04-10 MED ORDER — ALBUTEROL SULFATE (5 MG/ML) 0.5% IN NEBU
5.0000 mg | INHALATION_SOLUTION | Freq: Once | RESPIRATORY_TRACT | Status: AC
  Administered 2012-04-10: 5 mg via RESPIRATORY_TRACT

## 2012-04-10 MED ORDER — ALBUTEROL SULFATE HFA 108 (90 BASE) MCG/ACT IN AERS
2.0000 | INHALATION_SPRAY | RESPIRATORY_TRACT | Status: DC | PRN
  Administered 2012-04-10: 2 via RESPIRATORY_TRACT
  Filled 2012-04-10: qty 6.7

## 2012-04-10 MED ORDER — AEROCHAMBER Z-STAT PLUS/MEDIUM MISC
1.0000 | Freq: Once | Status: AC
  Administered 2012-04-10: 1

## 2012-04-10 MED ORDER — PREDNISONE 20 MG PO TABS
60.0000 mg | ORAL_TABLET | Freq: Once | ORAL | Status: AC
  Administered 2012-04-10: 60 mg via ORAL
  Filled 2012-04-10: qty 3

## 2012-04-10 MED ORDER — ALBUTEROL SULFATE HFA 108 (90 BASE) MCG/ACT IN AERS: 1.0000 | INHALATION_SPRAY | Freq: Four times a day (QID) | RESPIRATORY_TRACT | Status: DC | PRN

## 2012-04-10 NOTE — ED Provider Notes (Signed)
History     CSN: [id number]  Arrival date & time 04/10/12  4098   First MD Initiated Contact with Patient 04/10/12 [phone]      Chief Complaint  Patient presents with  . Shortness of Breath    (Consider location/radiation/quality/duration/timing/severity/associated sxs/prior treatment) HPI History provided by patient. Was exposed to some cleaning products at night, believes it was bleach and then developed chest tightness and coughing. Patient states she has a history of the same sort of reaction in the past but never lasted long and she never required medical attention. No known history of asthma. No recent fevers or cough. No known sick contacts. Symptoms moderate in severity. No rash. Past Medical History  Diagnosis Date  . Migraine     rare  . Alopecia     getting injections through dermatologist  . Alopecia     History reviewed. No pertinent past surgical history.  Family History  Problem Relation Age of Onset  . Heart disease Brother     recently found to have enlarged heart (age 69)  . Diabetes Maternal Grandmother   . Cancer Maternal Grandfather     colon cancer (60's)  . Hypertension Paternal Grandmother     History  Substance Use Topics  . Smoking status: Current Some Day Smoker -- 0.0 packs/day    Types: Cigarettes  . Smokeless tobacco: Not on file     Comment: about twice a week, when leaving work.  cutting back from 1 pack/week  . Alcohol Use: Yes     Comment: Occasional social drinker    OB History    Grav Para Term Preterm Abortions TAB SAB Ect Mult Living   2    2 1 1          Review of Systems  Constitutional: Negative for fever and chills.  HENT: Negative for neck pain and neck stiffness.   Eyes: Negative for pain.  Respiratory: Positive for cough, chest tightness and wheezing.   Cardiovascular: Negative for chest pain.  Gastrointestinal: Negative for abdominal pain.  Genitourinary: Negative for dysuria.  Musculoskeletal: Negative for back  pain.  Skin: Negative for rash.  Neurological: Negative for headaches.  All other systems reviewed and are negative.    Allergies  Review of patient's allergies indicates no known allergies.  Home Medications  No current outpatient prescriptions on file.  BP 118/76  Pulse 82  Temp 98 F (36.7 C) (Oral)  Resp 16  SpO2 98%  Physical Exam  Constitutional: She is oriented to person, place, and time. She appears well-developed and well-nourished.  HENT:  Head: Normocephalic and atraumatic.  Eyes: EOM are normal. Pupils are equal, round, and reactive to light.  Neck: Neck supple.  Cardiovascular: Normal rate, regular rhythm and intact distal pulses.   Pulmonary/Chest:       Bilateral expiratory wheezes with mildly decreased breath sounds. No respiratory distress.  Abdominal: Soft. Bowel sounds are normal. She exhibits no distension. There is no tenderness.  Musculoskeletal: Normal range of motion. She exhibits no edema.  Neurological: She is alert and oriented to person, place, and time.  Skin: Skin is warm and dry.    ED Course  Procedures (including critical care time)  Pulse ox 98% on room air is adequate.  Albuterol treatment provided.  Recheck lung sounds much improved and symptomatically improved. Wheezes resolved. Inhaler provided. By mouth prednisone provided with prescription.  In followup primary care physician for recheck in the clinic. Reliable historian and agrees to strict return precautions.  MDM   Wheezing  - resolved with albuterol after exposure to cleaning products. No hypoxia, respiratory distress or indication for chest x-ray at this time. Prescriptions provided and plan close primary care followup. Vital signs and nursing notes reviewed.       Sunnie Nielsen, MD 04/10/12 [phone]

## 2012-04-10 NOTE — ED Notes (Signed)
No untoward changes. "feeling better", breathing easier, LS CTA.

## 2012-04-10 NOTE — ED Notes (Addendum)
PT with acute onset chest "tightness" and coughing.  States this has happened to her before and she feels it is r/t to smelling strong odors such as cleaning solutions.  Insp wheezing throughout.

## 2012-04-10 NOTE — ED Notes (Signed)
Pt alert, NAD, calm, interactive, skin W&D, resps e/u, speaking in clear complete sentences, (denies: fever, nvd, cough, congestion, cold sx, sick contacts or recent illness), onset of sob, cough and wheezing around 2200 when she came into contact with strong odor, h/o similar, denies h/o asthma, distant use of inhaler as a child. No coughing or distress noted, wheezing present, neb started per protocol, friend at [facility].

## 2012-04-18 ENCOUNTER — Telehealth: Payer: Self-pay | Admitting: Family Medicine

## 2012-04-18 NOTE — Telephone Encounter (Signed)
IMMUNIZATIONS & CHART ON YOUR DESK

## 2012-04-21 ENCOUNTER — Telehealth: Payer: Self-pay | Admitting: *Deleted

## 2012-04-21 NOTE — Telephone Encounter (Signed)
Called patient and let her know that Dr.Knapp is in need of childhood immunizations in order to complete form.

## 2012-04-21 NOTE — Telephone Encounter (Signed)
Only received employee health records, still need childhood immunizations

## 2012-05-22 ENCOUNTER — Encounter: Payer: Self-pay | Admitting: Family Medicine

## 2012-05-22 ENCOUNTER — Ambulatory Visit (INDEPENDENT_AMBULATORY_CARE_PROVIDER_SITE_OTHER): Payer: BC Managed Care – PPO | Admitting: Family Medicine

## 2012-05-22 VITALS — BP 118/86 | HR 84 | Ht 66.0 in | Wt 156.0 lb

## 2012-05-22 DIAGNOSIS — F17200 Nicotine dependence, unspecified, uncomplicated: Secondary | ICD-10-CM

## 2012-05-22 DIAGNOSIS — R209 Unspecified disturbances of skin sensation: Secondary | ICD-10-CM

## 2012-05-22 DIAGNOSIS — R202 Paresthesia of skin: Secondary | ICD-10-CM | POA: Insufficient documentation

## 2012-05-22 DIAGNOSIS — R51 Headache: Secondary | ICD-10-CM

## 2012-05-22 LAB — CBC WITH DIFFERENTIAL/PLATELET
Basophils Relative: 1 % (ref 0–1)
Eosinophils Absolute: 0.2 10*3/uL (ref 0.0–0.7)
Lymphs Abs: 2.3 10*3/uL (ref 0.7–4.0)
MCH: 28.1 pg (ref 26.0–34.0)
MCHC: 33.5 g/dL (ref 30.0–36.0)
Neutro Abs: 1.9 10*3/uL (ref 1.7–7.7)
Neutrophils Relative %: 40 % — ABNORMAL LOW (ref 43–77)
Platelets: 242 10*3/uL (ref 150–400)
RBC: 4.37 MIL/uL (ref 3.87–5.11)

## 2012-05-22 LAB — COMPREHENSIVE METABOLIC PANEL
ALT: 30 U/L (ref 0–35)
AST: 42 U/L — ABNORMAL HIGH (ref 0–37)
Creat: 0.6 mg/dL (ref 0.50–1.10)
Sodium: 139 mEq/L (ref 135–145)
Total Bilirubin: 0.3 mg/dL (ref 0.3–1.2)

## 2012-05-22 LAB — TSH: TSH: 0.777 u[IU]/mL (ref 0.350–4.500)

## 2012-05-22 NOTE — Patient Instructions (Signed)
I think your headaches were most likely sinus-related.  There is still evidence of congestion on exam.  If you have recurrent headaches, try sinus rinses (sinus rinse kit or neti-pot) and/or decongestants such as sudafed (any "sinus" medication).  Drink plenty of fluids.  We are doing some labs to evaluate for the vague numbness in your left arm.  Look into MyChart for results in 1-2 days.  Return if you develop change in neurologic symptoms--worsening numbness, weakness, or other concerns.

## 2012-05-22 NOTE — Progress Notes (Signed)
Chief Complaint  Patient presents with  . Migraine    has been having daily HA's. Last week every day. Experiences nausea and eye pain with these HA's.  Used regular Excedrin with no relief.   Headache started last Sunday (10-11 days ago)--had headache every day until 3 days ago.  No further headaches since (just slight headache in the morning).  Headache started behind both eyes.  Headaches started in the morning, shortly after waking up.  She took Excedrin, Aleve which didn't seem to help.  Headache seemed to last most of the day, improved by the evening.  Max severity was mid-day, 6-7/10 intensity.  Some mild nausea.  Felt worse when looking at her computer, no sound sensitivity.  These headaches were different than her typical migraine (which usually doesn't involve her eyes).  Last eye exam 1 year ago, glasses 22 year old.    Denies any congestion, URI symptoms, cough, fevers.  She also felt like she was having some heartburn, discomfort from her head down to the neck/chest.   She cut back further on smoking--now maybe smoking once a week.  Sometimes social, other times when stress "I need a cigarette!".    Past Medical History  Diagnosis Date  . Migraine     rare  . Alopecia     getting injections through dermatologist   History reviewed. No pertinent past surgical history. History   Social History  . Marital Status: Single    Spouse Name: N/A    Number of Children: N/A  . 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 Smoking status: Current Some Day Smoker -- 0.0 packs/day    Types: Cigarettes  . Smokeless tobacco: Not on file     Comment: Rare cigarette, 1/week. cutting back from 1 pack/week  . Alcohol Use: Yes     Comment: Occasional social drinker  . Drug Use: No  . Sexually Active: Yes -- Female partner(s)    Birth Control/ Protection: Condom   Other Topics Concern  . 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  Lives with her mother, brother, sister and nephew.  No pets.  No passive tobacco exposure   Current Outpatient Prescriptions on File Prior to Visit  Medication Sig Dispense Refill  . albuterol (PROVENTIL HFA;VENTOLIN HFA) 108 (90 BASE) MCG/ACT inhaler Inhale 1-2 puffs into the lungs every 6 (six) hours as needed for wheezing.  1 Inhaler  0   No Known Allergies  ROS: No diarrhea, abdominal pain.  Very slight numbness in L arm, pretty constant.  Not aware of it with exercise, no weakness.  Most aware of it when she isn't doing anything.  Seems to start at her shoulder, and radiates down the left arm, less severe down the arm. This tingling sensation has been present for about a year, unchanged.  Went to ER when it first occurred.  Denies anxiety, skin rash, bleeding/bruising, other neuro symptoms, or other concerns.  See HPI  PHYSICAL EXAM: BP 118/86  Pulse 84  Ht 5\' 6"  (1.676 m)  Wt 156 lb (70.761 kg)  BMI 25.18 kg/m2  LMP 05/12/2012 Pleasant, well developed female in no distress HEENT:  PERRL, EOMI, conjunctiva clear.  Fundi benign.  TM's and EAC's normal. Nasal mucosa moderately edematous with whitish clear mucus.  OP clear.  Sinuses  nontender Neck: no lymphadenopathy, thyromegaly or mass Heart: regular rate and rhythm Lungs: clear bilaterally Neuro: alert and oriented, cranial nerves 2-12 intact.  Normal strength, sensation, DTR's.  Normal gait. Subjective decreased/difference in sensation L c-6 distribution (mainly laterally and slightly posteriorly L arm) Skin: no rash Psych: normal mood, affect, hygiene and grooming.  Normal speech, eye contact.  ASSESSMENT/PLAN: 1. Headache    2. Paresthesia of left arm  CBC with Differential, TSH, Comprehensive metabolic panel, Vitamin B12  3. Tobacco use disorder      Vague paresthesia LUE x 1 year--unchanged.   C-6 distribution.  DDx reviewed in  detail Prefers labs to be done today to further evaluate the numbness/tingling.  Headache--resolved.  Given history and physical exam, appears consistent with sinus headaches, which have resolved.  Discussed use of decongestants and sinus rinses, along with guaifenesin if needed should she have recurrent problems.  Quit smoking entirely.  Counseling given.  25 minute visit, more than 1/2 spent counseling.

## 2012-05-26 ENCOUNTER — Other Ambulatory Visit: Payer: Self-pay | Admitting: *Deleted

## 2012-05-26 DIAGNOSIS — R748 Abnormal levels of other serum enzymes: Secondary | ICD-10-CM

## 2012-09-03 ENCOUNTER — Encounter: Payer: Self-pay | Admitting: Family Medicine

## 2012-09-03 ENCOUNTER — Telehealth: Payer: Self-pay | Admitting: Family Medicine

## 2012-09-03 ENCOUNTER — Other Ambulatory Visit: Payer: BC Managed Care – PPO

## 2012-09-03 DIAGNOSIS — Z139 Encounter for screening, unspecified: Secondary | ICD-10-CM

## 2012-09-03 NOTE — Telephone Encounter (Signed)
Pt would like to get a varicella titer TODAY (IF POSSIBLE). PLEASE INFORM PT AND LET FRONT KNOW.

## 2012-09-03 NOTE — Telephone Encounter (Signed)
Patient will be in today @ 3:30pm.

## 2012-09-03 NOTE — Telephone Encounter (Signed)
Okay by me--screening for immunity

## 2012-10-29 ENCOUNTER — Ambulatory Visit (INDEPENDENT_AMBULATORY_CARE_PROVIDER_SITE_OTHER): Payer: BC Managed Care – PPO | Admitting: Family Medicine

## 2012-10-29 ENCOUNTER — Encounter: Payer: Self-pay | Admitting: Family Medicine

## 2012-10-29 VITALS — BP 104/72 | HR 84 | Ht 66.0 in | Wt 168.0 lb

## 2012-10-29 DIAGNOSIS — J069 Acute upper respiratory infection, unspecified: Secondary | ICD-10-CM

## 2012-10-29 DIAGNOSIS — R040 Epistaxis: Secondary | ICD-10-CM

## 2012-10-29 NOTE — Progress Notes (Signed)
Chief Complaint  Patient presents with  . Epistaxis    yesterday nose bled 6 x and today 3 x. Her nose is really dry.    She has been having some nasal congestion and runny nose x 3 days.  She has been blowing her nose a lot, slightly yellow mucus.  Denies fevers.  Slight sore throat, +sneezing.  Denies cough, shortness of breath or wheezing.  Yesterday her nose started bleeding from both sides.  Nose is very dry, as is her throat.  She pinched the nose, and used a cold washcloth at the bottom of her nose until the bleeding stopped.  Nose would start bleeding again after blowing nose, but there were times it was bleeding without known trauma/injury--noticed upon waking up from sleep.  Sometimes will start bleeding after just wiggling her nose around (when itchy, dry).  She has used sudafed during day, Nyquil at night.  Denies any NSAIDs.  Denies any pain, in nose, or sinus pain.  Past Medical History  Diagnosis Date  . Migraine     rare  . Alopecia     getting injections through dermatologist   History reviewed. No pertinent past surgical history.  History   Social History  . Marital Status: Single    Spouse Name: N/A    Number of Children: N/A  . 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 Smoking status: Current Some Day Smoker -- 0.00 packs/day    Types: Cigarettes  . Smokeless tobacco: Not on file     Comment: Rare cigarette, 1/week. cutting back from 1 pack/week  . Alcohol Use: Yes     Comment: Occasional social drinker  . Drug Use: No  . Sexually Active: Yes -- Female partner(s)    Birth Control/ Protection: Condom   Other Topics Concern  . 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  Lives with her mother, brother, sister and nephew.  No pets.  No passive tobacco exposure   Current outpatient prescriptions:Multiple  Vitamins-Minerals (MULTIVITAMIN WITH MINERALS) tablet, Take 1 tablet by mouth daily., Disp: , Rfl: ;  albuterol (PROVENTIL HFA;VENTOLIN HFA) 108 (90 BASE) MCG/ACT inhaler, Inhale 1-2 puffs into the lungs every 6 (six) hours as needed for wheezing., Disp: 1 Inhaler, Rfl: 0  No Known Allergies  ROS:  As per HPI.  No fevers, headaches, sore throat, ear pain, cough, shortness of breath, nausea, vomiting, rash.  Denies blood in urine, stool, easy bruising or other bleeding concerns.  paresthesia in L arm comes and goes, no worse  PHYSICAL EXAM: BP 104/72  Pulse 84  Ht 5\' 6"  (1.676 m)  Wt 168 lb (76.204 kg)  BMI 27.13 kg/m2  LMP 10/05/2012  Pleasant female in no distress HEENT:  PERRL ,EOMI, conjunctiva clear. TM's and EAC's normal.  OP clear. R nares--slight yellow crust with dried blood at distal R nares, superiorly L nares--distal septum has area of recent bleed.   Mucosa is mild-mod edematous, no erythema or purulence.  No evidence of bleeding more proximally at this time Neck:no lymphadenopathy or mass  ASSESSMENT/PLAN:  Epistaxis  Acute upper respiratory infections of unspecified site  Drink plenty of fluids Avoid aspirin and ibuprofen and aleve Okay to continue your current medications for cold symptoms. Use a nasal saline spray frequently throughout the day. Use an ointment at  the inside of the nose to keep moist and help prevent bleeding (bacitracin or plain vaseline) Be very gentle with the nose (no picking, no aggressive rubbing).  F/u prn  At end of visit pt was asking about Depo Provera. She reportedly used it in past.  Pap is UTD Return to discuss Depo Provera--return on menses; schedule CPE/pap

## 2012-10-29 NOTE — Patient Instructions (Addendum)
Drink plenty of fluids Avoid aspirin and ibuprofen and aleve Okay to continue your current medications for cold symptoms. Use a nasal saline spray frequently throughout the day. Use an ointment at the inside of the nose to keep moist and help prevent bleeding (bacitracin or plain vaseline) Be very gentle with the nose (no picking, no aggressive rubbing).   Return to discuss Depo Provera--return on menses

## 2012-11-06 ENCOUNTER — Encounter: Payer: Self-pay | Admitting: Family Medicine

## 2012-11-06 ENCOUNTER — Ambulatory Visit (INDEPENDENT_AMBULATORY_CARE_PROVIDER_SITE_OTHER): Payer: BC Managed Care – PPO | Admitting: Family Medicine

## 2012-11-06 VITALS — BP 92/60 | HR 72 | Ht 66.0 in | Wt 169.0 lb

## 2012-11-06 DIAGNOSIS — Z3009 Encounter for other general counseling and advice on contraception: Secondary | ICD-10-CM

## 2012-11-06 MED ORDER — MEDROXYPROGESTERONE ACETATE 150 MG/ML IM SUSP
150.0000 mg | Freq: Once | INTRAMUSCULAR | Status: AC
  Administered 2012-11-06: 150 mg via INTRAMUSCULAR

## 2012-11-06 NOTE — Patient Instructions (Signed)

## 2012-11-06 NOTE — Progress Notes (Signed)
Chief Complaint  Patient presents with  . Advice Only    discuss depo shot. Started cycle yesterday.   She was on Depo Provera injections x 2 years, about 1.5 years ago.  She did not have any weight gain, abnormal bleeding, hair loss or other side effects. She would like to start back on Depo Provera.  She is in a monogamous relationship, recently got engaged.  No further problems with nosebleeds since last visit.  Past Medical History  Diagnosis Date  . Migraine     rare  . Alopecia     getting injections through dermatologist   History reviewed. No pertinent past surgical history. History   Social History  . Marital Status: Single    Spouse Name: N/A    Number of Children: N/A  . 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 Smoking status: Current Some Day Smoker -- 0.00 packs/day    Types: Cigarettes  . Smokeless tobacco: Not on file     Comment: Rare cigarette, 1/week. cutting back from 1 pack/week  . Alcohol Use: Yes     Comment: Occasional social drinker  . Drug Use: No  . Sexually Active: Yes -- Female partner(s)    Birth Control/ Protection: Condom   Other Topics Concern  . 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  Lives with her mother, brother, sister and nephew.  No pets.  No passive tobacco exposure.  Engaged 2014   Current Outpatient Prescriptions on File Prior to Visit  Medication Sig Dispense Refill  . albuterol (PROVENTIL HFA;VENTOLIN HFA) 108 (90 BASE) MCG/ACT inhaler Inhale 1-2 puffs into the lungs every 6 (six) hours as needed for wheezing.  1 Inhaler  0  . Multiple Vitamins-Minerals (MULTIVITAMIN WITH MINERALS) tablet Take 1 tablet by mouth daily.       No current facility-administered medications on file prior to visit.   No Known Allergies  ROS:  Denies headaches, sinus pain, cough, chest pain, shortness  of breath, vaginal discharge, dysuria, weight gain or other concerns.  PHYSICAL EXAM: BP 92/60  Pulse 72  Ht 5\' 6"  (1.676 m)  Wt 169 lb (76.658 kg)  BMI 27.29 kg/m2  LMP 11/05/2012 Pleasant female in no distress No other exam performed, remainder of visit limited to counseling, discussion  Urine pregnancy test negative  ASSESSMENT/PLAN:  General counseling for initiation of contraceptive measures - Plan: POCT urine pregnancy, medroxyPROGESTERone (DEPO-PROVERA) injection 150 mg   Counseled extensively regarding risks/side effects of Depo Provera.  Injection given. Return in 12 weeks for next injection.  F/u as scheduled in December for CPE/pap

## 2013-01-28 ENCOUNTER — Other Ambulatory Visit (INDEPENDENT_AMBULATORY_CARE_PROVIDER_SITE_OTHER): Payer: BC Managed Care – PPO

## 2013-01-28 DIAGNOSIS — Z309 Encounter for contraceptive management, unspecified: Secondary | ICD-10-CM

## 2013-01-28 DIAGNOSIS — IMO0001 Reserved for inherently not codable concepts without codable children: Secondary | ICD-10-CM

## 2013-01-28 MED ORDER — MEDROXYPROGESTERONE ACETATE 150 MG/ML IM SUSP
150.0000 mg | Freq: Once | INTRAMUSCULAR | Status: AC
  Administered 2013-01-28: 150 mg via INTRAMUSCULAR

## 2013-03-30 ENCOUNTER — Ambulatory Visit (INDEPENDENT_AMBULATORY_CARE_PROVIDER_SITE_OTHER): Payer: BC Managed Care – PPO | Admitting: Family Medicine

## 2013-03-30 ENCOUNTER — Other Ambulatory Visit (HOSPITAL_COMMUNITY)
Admission: RE | Admit: 2013-03-30 | Discharge: 2013-03-30 | Disposition: A | Discharge status: Home / Self Care | Payer: BC Managed Care – PPO | Source: Ambulatory Visit | Attending: Family Medicine | Admitting: Family Medicine

## 2013-03-30 ENCOUNTER — Encounter: Payer: Self-pay | Admitting: Family Medicine

## 2013-03-30 VITALS — BP 120/74 | HR 72 | Ht 67.0 in | Wt 180.0 lb

## 2013-03-30 DIAGNOSIS — Z113 Encounter for screening for infections with a predominantly sexual mode of transmission: Secondary | ICD-10-CM | POA: Insufficient documentation

## 2013-03-30 DIAGNOSIS — Z0000 Encounter for general adult medical examination without abnormal findings: Secondary | ICD-10-CM

## 2013-03-30 DIAGNOSIS — Z01419 Encounter for gynecological examination (general) (routine) without abnormal findings: Secondary | ICD-10-CM | POA: Insufficient documentation

## 2013-03-30 DIAGNOSIS — F17200 Nicotine dependence, unspecified, uncomplicated: Secondary | ICD-10-CM

## 2013-03-30 DIAGNOSIS — R748 Abnormal levels of other serum enzymes: Secondary | ICD-10-CM

## 2013-03-30 DIAGNOSIS — R635 Abnormal weight gain: Secondary | ICD-10-CM

## 2013-03-30 LAB — POCT URINALYSIS DIPSTICK
Bilirubin, UA: NEGATIVE
Glucose, UA: NEGATIVE
Nitrite, UA: NEGATIVE

## 2013-03-30 LAB — HEPATIC FUNCTION PANEL
Bilirubin, Direct: 0.1 mg/dL (ref 0.0–0.3)
Indirect Bilirubin: 0.2 mg/dL (ref 0.0–0.9)
Total Bilirubin: 0.3 mg/dL (ref 0.3–1.2)
Total Protein: 7.3 g/dL (ref 6.0–8.3)

## 2013-03-30 NOTE — Patient Instructions (Addendum)
  HEALTH MAINTENANCE RECOMMENDATIONS:  It is recommended that you get at least 30 minutes of aerobic exercise at least 5 days/week (for weight loss, you may need as much as 60-90 minutes). This can be any activity that gets your heart rate up. This can be divided in 10-15 minute intervals if needed, but try and build up your endurance at least once a week.  Weight bearing exercise is also recommended twice weekly.  Eat a healthy diet with lots of vegetables, fruits and fiber.  "Colorful" foods have a lot of vitamins (ie green vegetables, tomatoes, red peppers, etc).  Limit sweet tea, regular sodas and alcoholic beverages, all of which has a lot of calories and sugar.  Up to 1 alcoholic drink daily may be beneficial for women (unless trying to lose weight, watch sugars).  Drink a lot of water.  Calcium recommendations are 1200-1500 mg daily (1500 mg for postmenopausal women or women without ovaries), and vitamin D 1000 IU daily.  This should be obtained from diet and/or supplements (vitamins), and calcium should not be taken all at once, but in divided doses.  Monthly self breast exams and yearly mammograms for women over the age of 20 is recommended.  Sunscreen of at least SPF 30 should be used on all sun-exposed parts of the skin when outside between the hours of 10 am and 4 pm (not just when at beach or pool, but even with exercise, golf, tennis, and yard work!)  Use a sunscreen that says "broad spectrum" so it covers both UVA and UVB rays, and make sure to reapply every 1-2 hours.  Remember to change the batteries in your smoke detectors when changing your clock times in the spring and fall.  Use your seat belt every time you are in a car, and please drive safely and not be distracted with cell phones and texting while driving.   Please try and quit smoking--start thinking about why/when you smoke (habit, boredom, stress) in order to come up with effective strategies to cut back or quit.  Available resources to help you quit include free counseling through [facility] Quitline ([place]Quitline.com or 1-800-QUITNOW), smoking cessation classes through [facility] (call to find out schedule), over-the-counter nicotine replacements, and e-cigarettes (although this may not help break the hand-mouth habit).  Many insurance companies also have smoking cessation programs (which may decrease the cost of patches, meds if enrolled).  If these methods are not effective for you, and you are motivated to quit, return to discuss the possibility of prescription medications. Set a quit date!!

## 2013-03-30 NOTE — Progress Notes (Signed)
Chief Complaint  Patient presents with  . Annual Exam    fasting annual with pap. Did not do eye exam, she just had one. UA showed 1+ leuks and trace blood, no symptoms. Would like to discuss smoking cessation.    Elizabeth Williamson is a 22 y.o. female who presents for a complete physical.  She has the following concerns:  She has gained 11# since going back on Depo Provera in the end of July, 22# since her physical last year. She admits to snacking a lot, not eating meals regularly, since being back in school.  Not getting regular exercise.  She has been smoking more, due to stress.  Now smoking 3 packs/week (where at one point she got down to just 1 cigarette per week).  She has tried quitting on her own but wasn't successful, only lasted about 4 weeks.  She is interested in quitting smoking.  Review of chart shows she had a slightly elevated LFT in 05/2012--she never came back to have it repeated.   Immunization History  Administered Date(s) Administered  . Influenza Split 03/06/2012, 02/14/2013  She reports having Tdap through [facility], and PPD also done through [facility].  She reports getting HPV series in high school Last Pap smear: last year Last mammogram: n/a  Last colonoscopy: n/a  Last DEXA: n/a  Dentist: twice yearly  Ophtho: yearly; wears glasses  Exercise: Not getting any regular exercise (in the past, prior to her >20# weight gain, she was getting 2-3x/week Zumba , walking)  Past Medical History  Diagnosis Date  . Migraine     rare  . Alopecia     getting injections through dermatologist    History reviewed. No pertinent past surgical history.  History   Social History  . Marital Status: Single    Spouse Name: N/A    Number of Children: N/A  . 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 Smoking status: Current Every Day Smoker -- 0.50 packs/day    Types: Cigarettes  . Smokeless tobacco: Not on file      Comment: increased to 3 pack/week  . Alcohol Use: Yes     Comment: Occasional social drinker, every other weekend  . Drug Use: No  . Sexual Activity: Yes    Partners: Male    Birth Control/ Protection: Injection   Other Topics Concern  . 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  Lives with her mother, brother, sister and nephew.  No pets.  No passive tobacco exposure.  Engaged 2014    Family History  Problem Relation Age of Onset  . Heart disease Brother     recently found to have enlarged heart (age 82)  . Diabetes Maternal Grandmother   . Cancer Maternal Grandfather     colon cancer (60's)  . Hypertension Paternal Grandmother     Current outpatient prescriptions:medroxyPROGESTERone (DEPO-PROVERA) 150 MG/ML injection, Inject 150 mg into the muscle every 3 (three) months., Disp: , Rfl: ;  Multiple Vitamins-Minerals (MULTIVITAMIN WITH MINERALS) tablet, Take 1 tablet by mouth daily., Disp: , Rfl: ;  albuterol (PROVENTIL HFA;VENTOLIN HFA) 108 (90 BASE) MCG/ACT inhaler, Inhale 1-2 puffs into the lungs every 6 (six) hours as needed for wheezing., Disp: 1 Inhaler, Rfl: 0  No Known Allergies  ROS: The patient denies anorexia, fever, headaches, vision  changes, decreased hearing, ear pain, sore throat, breast concerns, chest pain, palpitations, dizziness, syncope, dyspnea on exertion, cough, swelling, nausea, vomiting, diarrhea, abdominal pain, melena, hematochezia, indigestion/heartburn, hematuria, incontinence, dysuria, vaginal discharge, odor or itch, genital lesions, joint pains, weakness, tremor, suspicious skin lesions, depression, anxiety, abnormal bleeding/bruising, or enlarged lymph nodes.  Numbness in L arm, unchanged; symptoms are ongoing, more noticeable when at rest.  evaluated 3 yrs ago by MD and told everything was okay).  Denies any weakness, just tingling. Tingling is mainly in hand, 2-5th  fingers (thumb not affected), and into forearm; not above elbow. Denies elbow or neck pain. Constant, not with activities.  +irregular menses/spotting since on Depo Provera.  She is spotting today (urine dip slightly abnormal).  No dysuria or other urinary complaints. +weight gain.  Denies any skin/nail changes.  She is still getting injections through dermatologist for alopecia   PHYSICAL EXAM: BP 120/74  Pulse 72  Ht 5\' 7"  (1.702 m)  Wt 180 lb (81.647 kg)  BMI 28.19 kg/m2  General Appearance:  Alert, cooperative, no distress, appears stated age   Head:  Normocephalic, without obvious abnormality, atraumatic   Eyes:  PERRL, conjunctiva/corneas clear, EOM's intact, fundi  benign   Ears:  Normal TM's and external ear canals   Nose:  Nares normal, mucosa normal, no drainage or sinus tenderness   Throat:  Lips, mucosa, and tongue normal; teeth and gums normal   Neck:  Supple, no lymphadenopathy; thyroid: no enlargement/tenderness/nodules; no carotid  bruit or JVD   Back:  Spine nontender, no curvature, ROM normal, no CVA tenderness   Lungs:  Clear to auscultation bilaterally without wheezes, rales or ronchi; respirations unlabored   Chest Wall:  No tenderness or deformity   Heart:  Regular rate and rhythm, S1 and S2 normal, no murmur, rub  or gallop   Breast Exam:  No tenderness, masses, or nipple discharge or inversion. No axillary lymphadenopathy   Abdomen:  Soft, non-tender, nondistended, normoactive bowel sounds,  no masses, no hepatosplenomegaly   Genitalia:  Normal external genitalia without lesions. BUS and vagina normal; cervix without lesions, or cervical motion tenderness. Mild amount of thin white discharge. Slightly friable cervix. Uterus and adnexa not enlarged, nontender, no masses. Pap performed   Rectal:  Not performed due to age<40 and no related complaints   Extremities:  No clubbing, cyanosis or edema   Pulses:  2+ and symmetric all extremities   Skin:  Skin color,  texture, turgor normal, no rashes or lesions. +tattoos.  Thinning on top of scalp-no large patches of alopecia--there appears to be hair regrowth in all areas.    Lymph nodes:  Cervical, supraclavicular, and axillary nodes normal   Neurologic:  CNII-XII intact, normal strength, sensation and gait; reflexes 2+ and symmetric throughout          Psych: Normal mood, affect, hygiene and grooming.   ASSESSMENT/PLAN:  Routine general medical examination at a health care facility - Plan: POCT Urinalysis Dipstick, Cytology - PAP [place], Glucose, random  Elevated liver enzymes - Plan: Hepatic Function Panel  Weight gain - Plan: TSH  Tobacco use disorder  Discussed monthly self breast exams and yearly mammograms after the age of 34; at least 30 minutes of aerobic activity at least 5 days/week; proper sunscreen use reviewed; healthy diet, including goals of calcium and vitamin D intake and alcohol recommendations (less than or equal to 1 drink/day) reviewed; regular seatbelt use; changing batteries in smoke detectors. Immunization recommendations discussed, UTD. Colonoscopy recommendations  reviewed--age 64   Counseled extensively re: smoking cessation, including risks, consequences, importance of setting a quit date, and available help/resources. Pneumovax not given--encouraged to quit smoking. If she continues to smoke, will need Prevnar-13

## 2013-03-31 LAB — GLUCOSE, RANDOM: Glucose, Bld: 99 mg/dL (ref 70–99)

## 2013-04-01 ENCOUNTER — Encounter: Payer: Self-pay | Admitting: Family Medicine

## 2013-04-21 ENCOUNTER — Other Ambulatory Visit (INDEPENDENT_AMBULATORY_CARE_PROVIDER_SITE_OTHER): Payer: BC Managed Care – PPO

## 2013-04-21 DIAGNOSIS — Z309 Encounter for contraceptive management, unspecified: Secondary | ICD-10-CM

## 2013-04-21 MED ORDER — MEDROXYPROGESTERONE ACETATE 150 MG/ML IM SUSP
150.0000 mg | Freq: Once | INTRAMUSCULAR | Status: AC
  Administered 2013-04-21: 150 mg via INTRAMUSCULAR

## 2013-05-06 ENCOUNTER — Other Ambulatory Visit (INDEPENDENT_AMBULATORY_CARE_PROVIDER_SITE_OTHER): Payer: BC Managed Care – PPO

## 2013-05-06 DIAGNOSIS — Z111 Encounter for screening for respiratory tuberculosis: Secondary | ICD-10-CM

## 2013-05-08 LAB — TB SKIN TEST
Induration: 0 mm
TB Skin Test: NEGATIVE

## 2013-07-13 ENCOUNTER — Other Ambulatory Visit (INDEPENDENT_AMBULATORY_CARE_PROVIDER_SITE_OTHER): Payer: BC Managed Care – PPO

## 2013-07-13 DIAGNOSIS — Z309 Encounter for contraceptive management, unspecified: Secondary | ICD-10-CM

## 2013-07-13 MED ORDER — MEDROXYPROGESTERONE ACETATE 150 MG/ML IM SUSP
150.0000 mg | Freq: Once | INTRAMUSCULAR | Status: AC
  Administered 2013-07-13: 150 mg via INTRAMUSCULAR

## 2013-09-28 ENCOUNTER — Telehealth: Payer: Self-pay | Admitting: Family Medicine

## 2013-09-28 MED ORDER — ALBUTEROL SULFATE HFA 108 (90 BASE) MCG/ACT IN AERS: 1.0000 | INHALATION_SPRAY | Freq: Four times a day (QID) | RESPIRATORY_TRACT | Status: DC | PRN

## 2013-09-28 NOTE — Telephone Encounter (Signed)
This was prescribed back in 03/2012.  At her last visit here in 03/2013, she was smoking more, which obviously can contribute to some shortness of breath.  I would prefer her to schedule OV to discuss, so that we can discuss in more detail (and discuss smoking).  If this isn't convenient, and she has quit smoking, then okay to refill

## 2013-09-28 NOTE — Telephone Encounter (Signed)
Pt called and requested refill on albuterol. She states that she was given rx in er but saw you for follow up so you are aware that she is on this medication. Pt is requesting refill be sent to [facility] [address]

## 2013-09-28 NOTE — Telephone Encounter (Signed)
Patient is getting married this Saturday June 20th-she quit smoking a few months ago so I did go ahead and send her refill to [facility] [address]

## 2013-10-01 ENCOUNTER — Other Ambulatory Visit: Payer: BC Managed Care – PPO

## 2013-10-02 ENCOUNTER — Other Ambulatory Visit (INDEPENDENT_AMBULATORY_CARE_PROVIDER_SITE_OTHER): Payer: BC Managed Care – PPO

## 2013-10-02 DIAGNOSIS — Z3009 Encounter for other general counseling and advice on contraception: Secondary | ICD-10-CM

## 2013-10-02 MED ORDER — MEDROXYPROGESTERONE ACETATE 150 MG/ML IM SUSP
150.0000 mg | Freq: Once | INTRAMUSCULAR | Status: AC
  Administered 2013-10-02: 150 mg via INTRAMUSCULAR

## 2013-11-03 ENCOUNTER — Emergency Department (HOSPITAL_COMMUNITY): Payer: BC Managed Care – PPO

## 2013-11-03 ENCOUNTER — Encounter (HOSPITAL_COMMUNITY): Payer: Self-pay | Admitting: Emergency Medicine

## 2013-11-03 DIAGNOSIS — Z8679 Personal history of other diseases of the circulatory system: Secondary | ICD-10-CM | POA: Insufficient documentation

## 2013-11-03 DIAGNOSIS — R079 Chest pain, unspecified: Secondary | ICD-10-CM | POA: Insufficient documentation

## 2013-11-03 DIAGNOSIS — Z872 Personal history of diseases of the skin and subcutaneous tissue: Secondary | ICD-10-CM | POA: Insufficient documentation

## 2013-11-03 DIAGNOSIS — F17200 Nicotine dependence, unspecified, uncomplicated: Secondary | ICD-10-CM | POA: Insufficient documentation

## 2013-11-03 LAB — I-STAT TROPONIN, ED: Troponin i, poc: 0 ng/mL (ref 0.00–0.08)

## 2013-11-03 LAB — CBC
HEMATOCRIT: 40.4 % (ref 36.0–46.0)
HEMOGLOBIN: 13 g/dL (ref 12.0–15.0)
MCH: 28.2 pg (ref 26.0–34.0)
MCHC: 32.2 g/dL (ref 30.0–36.0)
MCV: 87.6 fL (ref 78.0–100.0)
Platelets: 222 10*3/uL (ref 150–400)
RBC: 4.61 MIL/uL (ref 3.87–5.11)
RDW: 12.4 % (ref 11.5–15.5)
WBC: 5.7 10*3/uL (ref 4.0–10.5)

## 2013-11-03 IMAGING — CR DG CHEST 2V
2 series · 2 of 2 positions shown · non-contrast
Comparison: None.

CLINICAL DATA: Chest pain.

EXAM:
CHEST  2 VIEW

[w chest pa]
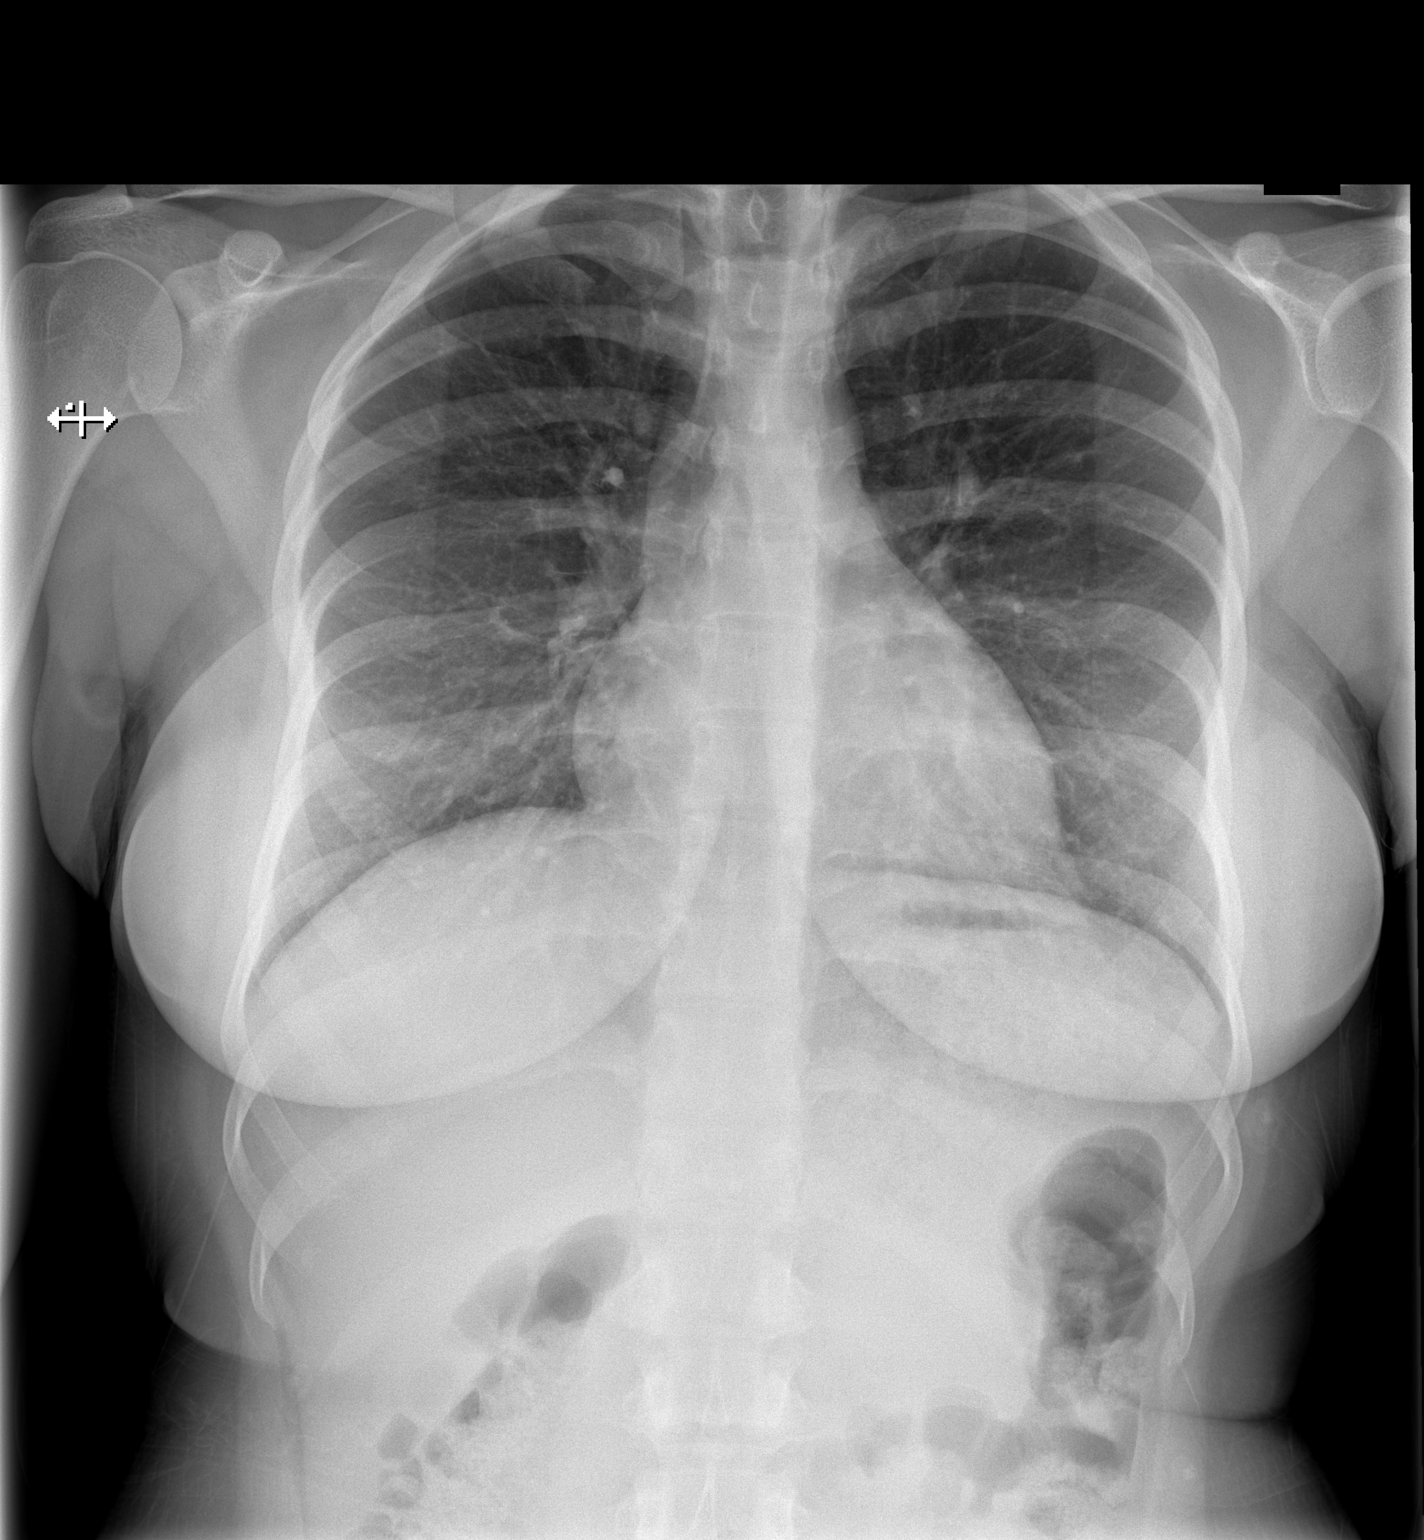

[w chest lat]
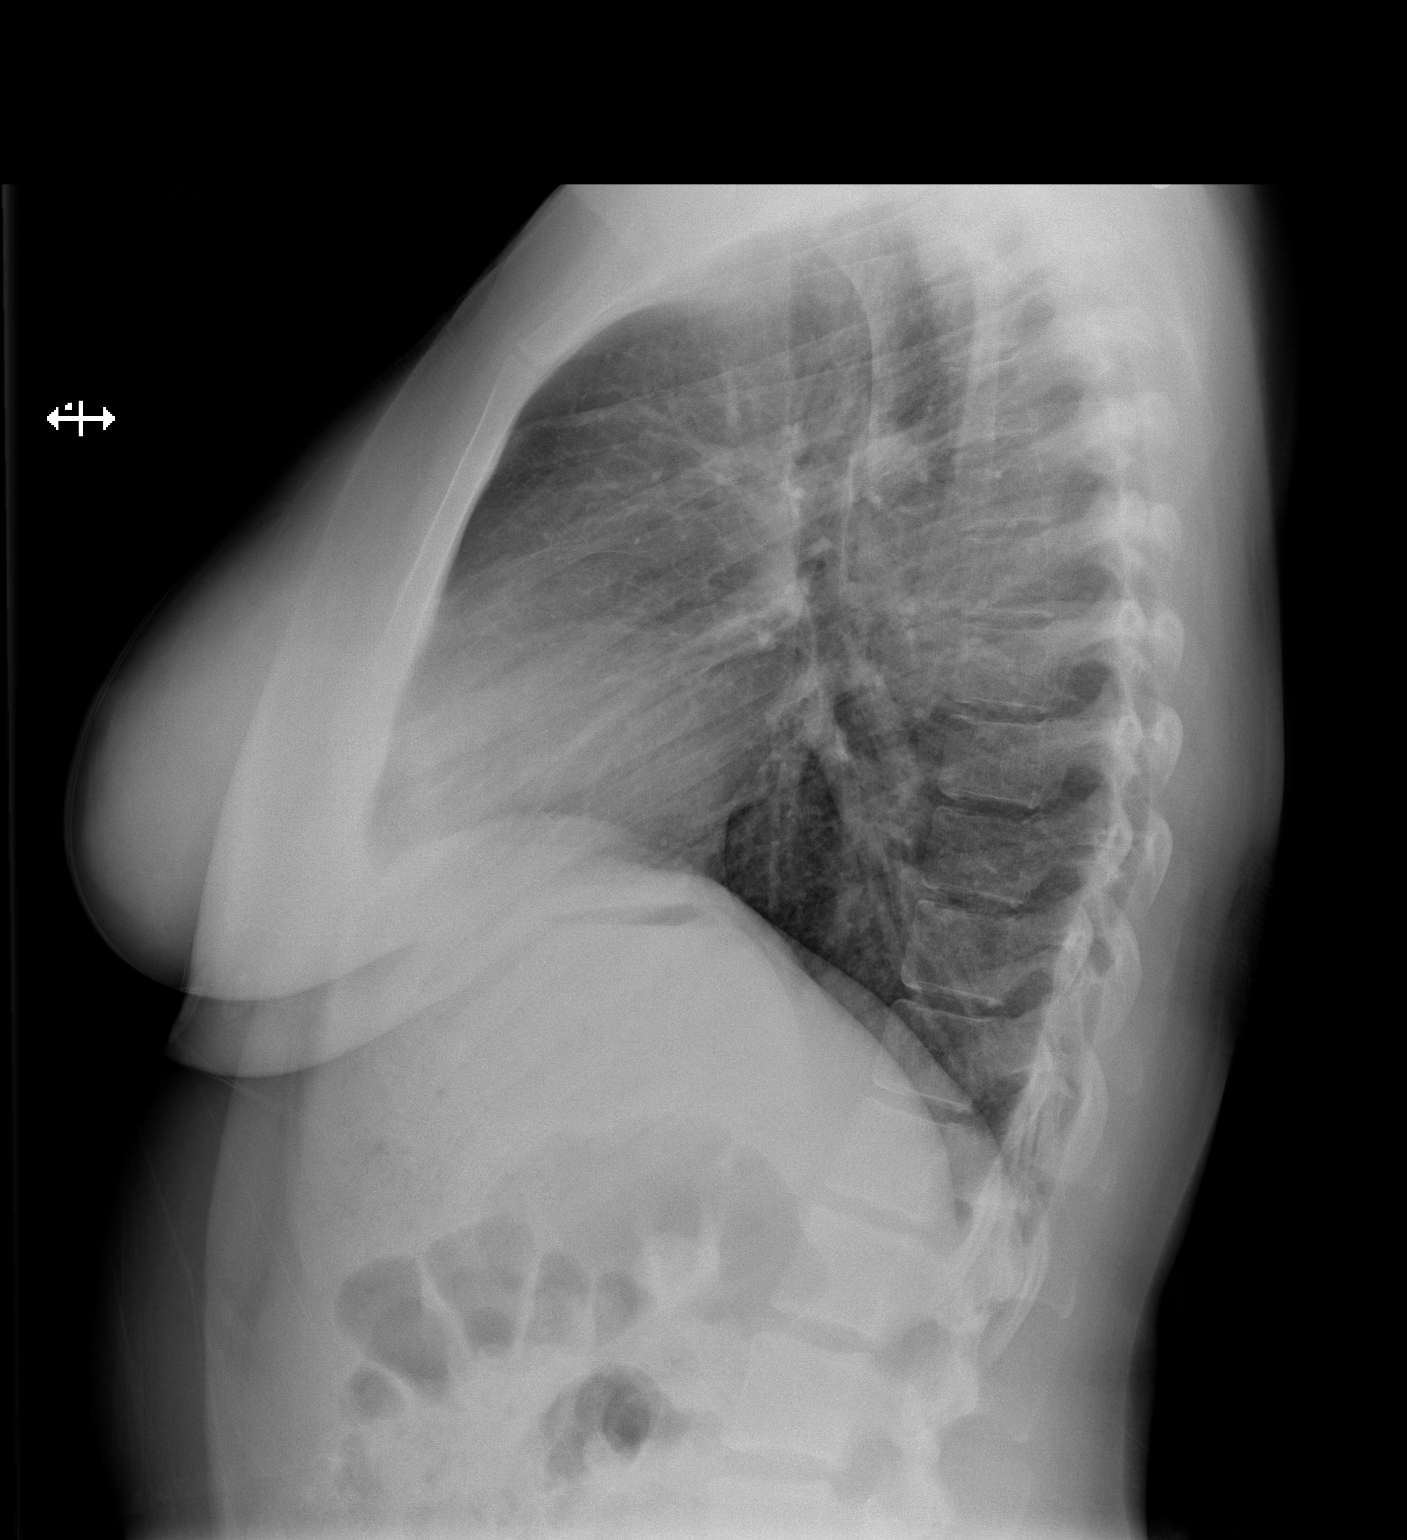

[2 of 2 positions shown; findings below may reference images not displayed]

FINDINGS: The lungs are well-aerated and clear. There is no evidence of focal
opacification, pleural effusion or pneumothorax.

The heart is normal in size; the mediastinal contour is within
normal limits. No acute osseous abnormalities are seen.
IMPRESSION: No acute cardiopulmonary process seen.

## 2013-11-03 NOTE — ED Notes (Signed)
Pt. reports mid chest pan with SOB after eating dinner this evening , denies nausea or diaphoresis .

## 2013-11-04 ENCOUNTER — Emergency Department (HOSPITAL_COMMUNITY)
Admission: EM | Admit: 2013-11-04 | Discharge: 2013-11-04 | Disposition: A | Discharge status: Home / Self Care | Payer: BC Managed Care – PPO | Attending: Emergency Medicine | Admitting: Emergency Medicine

## 2013-11-04 DIAGNOSIS — R079 Chest pain, unspecified: Secondary | ICD-10-CM

## 2013-11-04 LAB — BASIC METABOLIC PANEL
Anion gap: 13 (ref 5–15)
BUN: 6 mg/dL (ref 6–23)
CALCIUM: 9.4 mg/dL (ref 8.4–10.5)
CO2: 21 mEq/L (ref 19–32)
Chloride: 108 mEq/L (ref 96–112)
Creatinine, Ser: 0.67 mg/dL (ref 0.50–1.10)
GFR calc non Af Amer: >90 mL/min (ref >90)
GLUCOSE: 131 mg/dL — AB (ref 70–99)
Potassium: 3.7 mEq/L (ref 3.7–5.3)
SODIUM: 142 meq/L (ref 137–147)

## 2013-11-04 LAB — PRO B NATRIURETIC PEPTIDE: Pro B Natriuretic peptide (BNP): 16.2 pg/mL (ref 0–125)

## 2013-11-04 LAB — D-DIMER, QUANTITATIVE: D-Dimer, Quant: <0.27 ug/mL-FEU (ref 0.00–0.48)

## 2013-11-04 NOTE — ED Provider Notes (Signed)
CSN: [id number]     Arrival date & time 11/03/13  2313 History   First MD Initiated Contact with Patient 11/04/13 0247     Chief Complaint  Patient presents with  . Chest Pain     (Consider location/radiation/quality/duration/timing/severity/associated sxs/prior Treatment) HPI  This patient is a generally healthy young woman who presents after developing burning and pressure-like sensation in the mid chest toward the end of her dinner. The patient had a large helping of positive which she quickly because she was very hungry. After she developed mid chest discomfort, she felt short of breath. This is extremely worrisome to her and as she came to the emergency department. She denies wheezing and cough.  Her symptoms have gradually resolved without any intervention. She has not been febrile. She has no nausea or vomiting.  The patient is not a current smoker. Nor does she use illicit drugs. She has a remote history of smoking with a total pack year history of 3.  She has no history of venous thromboembolism and no risk factors   Past Medical History  Diagnosis Date  . Migraine     rare  . Alopecia     getting injections through dermatologist   History reviewed. No pertinent past surgical history. Family History  Problem Relation Age of Onset  . Heart disease Brother     recently found to have enlarged heart (age 19)  . Diabetes Maternal Grandmother   . Cancer Maternal Grandfather     colon cancer (60's)  . Hypertension Paternal Grandmother    History  Substance Use Topics  . Smoking status: Current Every Day Smoker -- 0.50 packs/day    Types: Cigarettes  . Smokeless tobacco: Not on file     Comment: increased to 3 pack/week  . Alcohol Use: Yes     Comment: Occasional social drinker, every other weekend   OB History   Grav Para Term Preterm Abortions TAB SAB Ect Mult Living   2    2 1 1         Review of Systems  Ten point review of symptoms performed and is negative  with the exception of symptoms noted above.   Allergies  Review of patient's allergies indicates no known allergies.  Home Medications   Prior to Admission medications   Medication Sig Start Date End Date Taking? Authorizing Provider  medroxyPROGESTERone (DEPO-PROVERA) 150 MG/ML injection Inject 150 mg into the muscle every 3 (three) months.   Yes Historical Provider, MD   BP 118/73  Pulse 84  Temp(Src) 98.7 F (37.1 C) (Oral)  Resp 18  SpO2 99% Physical Exam Gen: well developed and well nourished appearing Head: NCAT Eyes: PERL, EOMI Nose: no epistaixis or rhinorrhea Mouth/throat: mucosa is moist and pink Neck: supple, no stridor Lungs: CTA B, no wheezing, rhonchi or rales CV: RRR, no murmur, extremities appear well perfused.  Abd: soft, notender, nondistended Back: no ttp, no cva ttp Skin: warm and dry Ext: normal to inspection, no dependent edema Neuro: CN ii-xii grossly intact, no focal deficits Psyche; normal affect,  calm and cooperative.   ED Course  Procedures (including critical care time) Labs Review  Results for orders placed during the hospital encounter of 11/04/13 (from the past 24 hour(s))  CBC     Status: None   Collection Time    11/03/13 11:27 PM      Result Value Ref Range   WBC 5.7  4.0 - 10.5 K/uL   RBC 4.61  3.87 -  5.11 MIL/uL   Hemoglobin 13.0  12.0 - 15.0 g/dL   HCT 09.840.4  11.936.0 - 14.746.0 %   MCV 87.6  78.0 - 100.0 fL   MCH 28.2  26.0 - 34.0 pg   MCHC 32.2  30.0 - 36.0 g/dL   RDW 82.912.4  56.211.5 - 13.015.5 %   Platelets 222  150 - 400 K/uL  BASIC METABOLIC PANEL     Status: Abnormal   Collection Time    11/03/13 11:27 PM      Result Value Ref Range   Sodium 142  137 - 147 mEq/L   Potassium 3.7  3.7 - 5.3 mEq/L   Chloride 108  96 - 112 mEq/L   CO2 21  19 - 32 mEq/L   Glucose, Bld 131 (*) 70 - 99 mg/dL   BUN 6  6 - 23 mg/dL   Creatinine, Ser 8.650.67  0.50 - 1.10 mg/dL   Calcium 9.4  8.4 - 78.410.5 mg/dL   GFR calc non Af Amer >90  >90 mL/min   GFR calc  Af Amer >90  >90 mL/min   Anion gap 13  5 - 15  PRO B NATRIURETIC PEPTIDE     Status: None   Collection Time    11/03/13 11:27 PM      Result Value Ref Range   Pro B Natriuretic peptide (BNP) 16.2  0 - 125 pg/mL  I-STAT TROPOININ, ED     Status: None   Collection Time    11/03/13 11:47 PM      Result Value Ref Range   Troponin i, poc 0.00  0.00 - 0.08 ng/mL   Comment 3           D-DIMER, QUANTITATIVE     Status: None   Collection Time    11/04/13  3:53 AM      Result Value Ref Range   D-Dimer, Quant <0.27  0.00 - 0.48 ug/mL-FEU    Imaging Review Dg Chest 2 View  11/03/2013   CLINICAL DATA:  Chest pain.  EXAM: CHEST  2 VIEW  COMPARISON:  None.  FINDINGS: The lungs are well-aerated and clear. There is no evidence of focal opacification, pleural effusion or pneumothorax.  The heart is normal in size; the mediastinal contour is within normal limits. No acute osseous abnormalities are seen.  IMPRESSION: No acute cardiopulmonary process seen.   Electronically Signed   By: Roanna RaiderJeffery  Chang M.D.   On: 11/03/2013 23:55    EKG: nsr, no acute ischemic changes, normal intervals, normal axis, normal qrs complex  MDM   DDX: ACS, pneumothorax, pneumonia, pericardial or pleural effusion, gastritis, GERD/PUD, musculoskeletal pain, PE.   We have ruled out PE, PNA, PTX, pericarditis. Do not suspect ACS. Suspect GERD as cause of sx. Patient is stable for discharge and will f/u with her PCP for recheck in the next 2 days.      Brandt LoosenJulie Trinia Georgi, MD 11/04/13 [phone]

## 2013-11-04 NOTE — Discharge Instructions (Signed)

## 2013-11-04 NOTE — ED Notes (Signed)
Pt reports sob, and generalized cp that started tonight after eating dinner. Pt describes pain as pressure. Pt reports she has not been diagnosed with asthma but she has an inhaler. Pt reports inhaler is not working for sob. Pt rates pain 3/10.

## 2013-11-09 ENCOUNTER — Encounter: Payer: Self-pay | Admitting: Family Medicine

## 2013-11-09 ENCOUNTER — Ambulatory Visit (INDEPENDENT_AMBULATORY_CARE_PROVIDER_SITE_OTHER): Payer: BC Managed Care – PPO | Admitting: Family Medicine

## 2013-11-09 VITALS — BP 112/74 | HR 88 | Ht 67.0 in | Wt 172.0 lb

## 2013-11-09 DIAGNOSIS — K219 Gastro-esophageal reflux disease without esophagitis: Secondary | ICD-10-CM | POA: Insufficient documentation

## 2013-11-09 NOTE — Patient Instructions (Signed)
Food Choices for Gastroesophageal Reflux Disease When you have gastroesophageal reflux disease (GERD), the foods you eat and your eating habits are very important. Choosing the right foods can help ease the discomfort of GERD. WHAT GENERAL GUIDELINES DO I NEED TO FOLLOW?  Choose fruits, vegetables, whole grains, low-fat dairy products, and low-fat meat, fish, and poultry.  Limit fats such as oils, salad dressings, butter, nuts, and avocado.  Keep a food diary to identify foods that cause symptoms.  Avoid foods that cause reflux. These may be different for different people.  Eat frequent small meals instead of three large meals each day.  Eat your meals slowly, in a relaxed setting.  Limit fried foods.  Cook foods using methods other than frying.  Avoid drinking alcohol.  Avoid drinking large amounts of liquids with your meals.  Avoid bending over or lying down until 2-3 hours after eating. WHAT FOODS ARE NOT RECOMMENDED? The following are some foods and drinks that may worsen your symptoms: Vegetables Tomatoes. Tomato juice. Tomato and spaghetti sauce. Chili peppers. Onion and garlic. Horseradish. Fruits Oranges, grapefruit, and lemon (fruit and juice). Meats High-fat meats, fish, and poultry. This includes hot dogs, ribs, ham, sausage, salami, and bacon. Dairy Whole milk and chocolate milk. Sour cream. Cream. Butter. Ice cream. Cream cheese.  Beverages Coffee and tea, with or without caffeine. Carbonated beverages or energy drinks. Condiments Hot sauce. Barbecue sauce.  Sweets/Desserts Chocolate and cocoa. Donuts. Peppermint and spearmint. Fats and Oils High-fat foods, including [place] fries and potato chips. Other Vinegar. Strong spices, such as black pepper, white pepper, red pepper, cayenne, curry powder, cloves, ginger, and chili powder. The items listed above may not be a complete list of foods and beverages to avoid. Contact your dietitian for more  information. Document Released: 04/02/2005 Document Revised: 04/07/2013 Document Reviewed: 02/04/2013 [facility] Patient Information 2015 [employer], [place]. This information is not intended to replace advice given to you by your health care provider. Make sure you discuss any questions you have with your health care provider.    Use Prilosec OTC or Nexium (also OTC) once daily for up to 2 weeks.  During this time, try and make the dietary and behavioral changes as discussed (less caffeine, mints, chocolate, spicy/acidic foods, alcohol). If you continue to have frequent symptoms, despite these changes, you may need to use the medication longer.  If you only have recurrent symptoms triggered by certain foods, then take a medication at least 30 minutes (to an hour) prior to eating that food (can be zantac, pepcid, or prilosec or nexium).  If you develop trouble swallowing, or ongoing/frequent symptoms, please return for re-evaluation.

## 2013-11-09 NOTE — Progress Notes (Signed)
Chief Complaint  Patient presents with  . Follow-up    ER follow up for chest pain. Patient states that she is doing much better. She states that she thinks ER said she has reflux.    She went to the ER 7/22 with chest pain that started after dinner (pasta, with alfredo sauce, tomatoes).  CBC, b-met, BNP, troponin and D-dimer were all normal.  CXR was normal.  EKG was normal.  She was diagnosed with reflux.  She was not given any medications at discharge.  No medications were given in the ER--symptoms had resolved spontaneously.  She has had some mild pressure and burning in her upper chest since then, just a few times, since the ER, mostly in the evenings. She tried Investment banker, operational without much benefit.  She seems to get a headache when she is having symptoms, eyes also feel sensitive.  She has h/o migraines, but none recently.  She has been having more nausea lately. She points to her upper chest/lower throat as where she feels the burning.  She had 1/2 cup of coffee this morning and felt some burning.  +after-dinner mints.  She works nights, so drinks coffee in the evenings (around the time she is symptomatic)  Past Medical History  Diagnosis Date  . Migraine     rare  . Alopecia     getting injections through dermatologist   History reviewed. No pertinent past surgical history. History   Social History  . Marital Status: Single    Spouse Name: N/A    Number of Children: N/A  . Years of Education: N/A   Occupational History  . 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 Smoking status: Former Smoker -- 0.50 packs/day    Types: Cigarettes    Quit date: 08/14/2013  . Smokeless tobacco: Not on file     Comment: increased to 3 pack/week  . Alcohol Use: Yes     Comment: Occasional social drinker, every other weekend  . Drug Use: No  . Sexual Activity: Yes    Partners: Male    Birth Control/ Protection: Injection   Other Topics Concern  . 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  Lives with her mother, brother, sister and nephew.  No pets.  No passive tobacco exposure.  Engaged 2014   Current Outpatient Prescriptions on File Prior to Visit  Medication Sig Dispense Refill  . medroxyPROGESTERone (DEPO-PROVERA) 150 MG/ML injection Inject 150 mg into the muscle every 3 (three) months.       No current facility-administered medications on file prior to visit.   No Known Allergies  ROS:  Denies fevers, chills, dysphagia, URI symptoms, exertional chest pain or dyspnea.  +nausea, heartburn.  No bowel changes, no urinary complaints.  See HPI.  PHYSICAL EXAM: BP 112/74  Pulse 88  Ht _0  (1.702 m)  Wt 172 lb (78.019 kg)  BMI 26.93 kg/m2 On Depo Provera  Well developed, pleasant female in no distress Neck: no lymphadenopathy, thyromegaly or mass Heart: regular rate and rhythm  Lungs: clear bilaterally Abdomen: soft, nontender, no organomegaly or mass Extremities: no edema Psych: normal mood, affect, hygiene, grooming Neuro: alert and oriented.  Normal strength, gait, cranial nerves  Lab results/imaging from ER reviewed.  ASSESSMENT/PLAN:  Gastroesophageal reflux disease without esophagitis  Counseled re: diet and behavioral modifications to treat reflux.  Use Prilosec  OTC or Nexium (also OTC) once daily for up to 2 weeks.  During this time, try and make the dietary and behavioral changes as discussed (less caffeine, mints, chocolate, spicy/acidic foods, alcohol). If you continue to have frequent symptoms, despite these changes, you may need to use the medication longer.  If you only have recurrent symptoms triggered by certain foods, then take a medication at least 30 minutes (to an hour) prior to eating that food (can be zantac, pepcid, or prilosec or nexium).  If you develop trouble swallowing, or ongoing/frequent symptoms, please return for re-evaluation.

## 2013-12-24 ENCOUNTER — Other Ambulatory Visit: Payer: BC Managed Care – PPO

## 2013-12-29 ENCOUNTER — Ambulatory Visit (INDEPENDENT_AMBULATORY_CARE_PROVIDER_SITE_OTHER): Payer: BC Managed Care – PPO | Admitting: Family Medicine

## 2013-12-29 ENCOUNTER — Encounter: Payer: Self-pay | Admitting: Family Medicine

## 2013-12-29 VITALS — BP 128/88 | HR 92 | Ht 67.0 in | Wt 179.0 lb

## 2013-12-29 DIAGNOSIS — Z23 Encounter for immunization: Secondary | ICD-10-CM

## 2013-12-29 DIAGNOSIS — Z3009 Encounter for other general counseling and advice on contraception: Secondary | ICD-10-CM

## 2013-12-29 NOTE — Patient Instructions (Signed)
Contraception Choices Contraception (birth control) is the use of any methods or devices to prevent pregnancy. Below are some methods to help avoid pregnancy. HORMONAL METHODS   Contraceptive implant. This is a thin, plastic tube containing progesterone hormone. It does not contain estrogen hormone. Your health care provider inserts the tube in the inner part of the upper arm. The tube can remain in place for up to 3 years. After 3 years, the implant must be removed. The implant prevents the ovaries from releasing an egg (ovulation), thickens the cervical mucus to prevent sperm from entering the uterus, and thins the lining of the inside of the uterus.  Progesterone-only injections. These injections are given every 3 months by your health care provider to prevent pregnancy. This synthetic progesterone hormone stops the ovaries from releasing eggs. It also thickens cervical mucus and changes the uterine lining. This makes it harder for sperm to survive in the uterus.  Birth control pills. These pills contain estrogen and progesterone hormone. They work by preventing the ovaries from releasing eggs (ovulation). They also cause the cervical mucus to thicken, preventing the sperm from entering the uterus. Birth control pills are prescribed by a health care provider.Birth control pills can also be used to treat heavy periods.  Minipill. This type of birth control pill contains only the progesterone hormone. They are taken every day of each month and must be prescribed by your health care provider.  Birth control patch. The patch contains hormones similar to those in birth control pills. It must be changed once a week and is prescribed by a health care provider.  Vaginal ring. The ring contains hormones similar to those in birth control pills. It is left in the vagina for 3 weeks, removed for 1 week, and then a new one is put back in place. The patient must be comfortable inserting and removing the ring  from the vagina.A health care provider's prescription is necessary.  Emergency contraception. Emergency contraceptives prevent pregnancy after unprotected sexual intercourse. This pill can be taken right after sex or up to 5 days after unprotected sex. It is most effective the sooner you take the pills after having sexual intercourse. Most emergency contraceptive pills are available without a prescription. Check with your pharmacist. Do not use emergency contraception as your only form of birth control. BARRIER METHODS   Female condom. This is a thin sheath (latex or rubber) that is worn over the penis during sexual intercourse. It can be used with spermicide to increase effectiveness.  Female condom. This is a soft, loose-fitting sheath that is put into the vagina before sexual intercourse.  Diaphragm. This is a soft, latex, dome-shaped barrier that must be fitted by a health care provider. It is inserted into the vagina, along with a spermicidal jelly. It is inserted before intercourse. The diaphragm should be left in the vagina for 6 to 8 hours after intercourse.  Cervical cap. This is a round, soft, latex or plastic cup that fits over the cervix and must be fitted by a health care provider. The cap can be left in place for up to 48 hours after intercourse.  Sponge. This is a soft, circular piece of polyurethane foam. The sponge has spermicide in it. It is inserted into the vagina after wetting it and before sexual intercourse.  Spermicides. These are chemicals that kill or block sperm from entering the cervix and uterus. They come in the form of creams, jellies, suppositories, foam, or tablets. They do not require a   prescription. They are inserted into the vagina with an applicator before having sexual intercourse. The process must be repeated every time you have sexual intercourse. INTRAUTERINE CONTRACEPTION  Intrauterine device (IUD). This is a T-shaped device that is put in a woman's uterus  during a menstrual period to prevent pregnancy. There are 2 types:  Copper IUD. This type of IUD is wrapped in copper wire and is placed inside the uterus. Copper makes the uterus and fallopian tubes produce a fluid that kills sperm. It can stay in place for 10 years.  Hormone IUD. This type of IUD contains the hormone progestin (synthetic progesterone). The hormone thickens the cervical mucus and prevents sperm from entering the uterus, and it also thins the uterine lining to prevent implantation of a fertilized egg. The hormone can weaken or kill the sperm that get into the uterus. It can stay in place for 3-5 years, depending on which type of IUD is used. PERMANENT METHODS OF CONTRACEPTION  Female tubal ligation. This is when the woman's fallopian tubes are surgically sealed, tied, or blocked to prevent the egg from traveling to the uterus.  Hysteroscopic sterilization. This involves placing a small coil or insert into each fallopian tube. Your doctor uses a technique called hysteroscopy to do the procedure. The device causes scar tissue to form. This results in permanent blockage of the fallopian tubes, so the sperm cannot fertilize the egg. It takes about 3 months after the procedure for the tubes to become blocked. You must use another form of birth control for these 3 months.  Female sterilization. This is when the female has the tubes that carry sperm tied off (vasectomy).This blocks sperm from entering the vagina during sexual intercourse. After the procedure, the man can still ejaculate fluid (semen). NATURAL PLANNING METHODS  Natural family planning. This is not having sexual intercourse or using a barrier method (condom, diaphragm, cervical cap) on days the woman could become pregnant.  Calendar method. This is keeping track of the length of each menstrual cycle and identifying when you are fertile.  Ovulation method. This is avoiding sexual intercourse during ovulation.  Symptothermal  method. This is avoiding sexual intercourse during ovulation, using a thermometer and ovulation symptoms.  Post-ovulation method. This is timing sexual intercourse after you have ovulated. Regardless of which type or method of contraception you choose, it is important that you use condoms to protect against the transmission of sexually transmitted infections (STIs). Talk with your health care provider about which form of contraception is most appropriate for you. Document Released: 04/02/2005 Document Revised: 04/07/2013 Document Reviewed: 09/25/2012 [facility] Patient Information 2015 [place], [place]. This information is not intended to replace advice given to you by your health care provider. Make sure you discuss any questions you have with your health care provider.  Ethinyl Estradiol; Etonogestrel vaginal ring What is this medicine? ETHINYL ESTRADIOL; ETONOGESTREL (ETH in il es tra DYE ole; et oh noe JES trel) vaginal ring is a flexible, vaginal ring used as a contraceptive (birth control method). This medicine combines two types of female hormones, an estrogen and a progestin. This ring is used to prevent ovulation and pregnancy. Each ring is effective for one month. This medicine may be used for other purposes; ask your health care provider or pharmacist if you have questions. COMMON BRAND NAME(S): NuvaRing What should I tell my health care provider before I take this medicine? They need to know if you have or ever had any of these conditions: -abnormal vaginal bleeding -  blood vessel disease or blood clots -breast, cervical, endometrial, ovarian, liver, or uterine cancer -diabetes -gallbladder disease -heart disease or recent heart attack -high blood pressure -high cholesterol -kidney disease -liver disease -migraine headaches -stroke -systemic lupus erythematosus (SLE) -tobacco smoker -an unusual or allergic reaction to estrogens, progestins, other medicines, foods, dyes, or  preservatives -pregnant or trying to get pregnant -breast-feeding How should I use this medicine? Insert the ring into your vagina as directed. Follow the directions on the prescription label. The ring will remain place for 3 weeks and is then removed for a 1-week break. A new ring is inserted 1 week after the last ring was removed, on the same day of the week. Do not use more often than directed. A patient package insert for the product will be given with each prescription and refill. Read this sheet carefully each time. The sheet may change frequently. Contact your pediatrician regarding the use of this medicine in children. Special care may be needed. This medicine has been used in female children who have started having menstrual periods. Overdosage: If you think you have taken too much of this medicine contact a poison control center or emergency room at once. NOTE: This medicine is only for you. Do not share this medicine with others. What if I miss a dose? You will need to replace your vaginal ring once a month as directed. If the ring should slip out, or if you leave it in longer or shorter than you should, contact your health care professional for advice. What may interact with this medicine? -acetaminophen -antibiotics or medicines for infections, especially rifampin, rifabutin, rifapentine, and griseofulvin, and possibly penicillins or tetracyclines -aprepitant -ascorbic acid (vitamin C) -atorvastatin -barbiturate medicines, such as phenobarbital -bosentan -carbamazepine -caffeine -clofibrate -cyclosporine -dantrolene -doxercalciferol -felbamate -grapefruit juice -hydrocortisone -medicines for anxiety or sleeping problems, such as diazepam or temazepam -medicines for diabetes, including pioglitazone -modafinil -mycophenolate -nefazodone -oxcarbazepine -phenytoin -prednisolone -ritonavir or other medicines for HIV infection or AIDS -rosuvastatin -selegiline -soy  isoflavones supplements -St. John's wort -tamoxifen or raloxifene -theophylline -thyroid hormones -topiramate -warfarin This list may not describe all possible interactions. Give your health care provider a list of all the medicines, herbs, non-prescription drugs, or dietary supplements you use. Also tell them if you smoke, drink alcohol, or use illegal drugs. Some items may interact with your medicine. What should I watch for while using this medicine? Visit your doctor or health care professional for regular checks on your progress. You will need a regular breast and pelvic exam and Pap smear while on this medicine. Use an additional method of contraception during the first cycle that you use this ring. If you have any reason to think you are pregnant, stop using this medicine right away and contact your doctor or health care professional. If you are using this medicine for hormone related problems, it may take several cycles of use to see improvement in your condition. Smoking increases the risk of getting a blood clot or having a stroke while you are using hormonal birth control, especially if you are more than 25 years old. You are strongly advised not to smoke. This medicine can make your body retain fluid, making your fingers, hands, or ankles swell. Your blood pressure can go up. Contact your doctor or health care professional if you feel you are retaining fluid. This medicine can make you more sensitive to the sun. Keep out of the sun. If you cannot avoid being in the sun, wear protective clothing and  use sunscreen. Do not use sun lamps or tanning beds/booths. If you wear contact lenses and notice visual changes, or if the lenses begin to feel uncomfortable, consult your eye care specialist. In some women, tenderness, swelling, or minor bleeding of the gums may occur. Notify your dentist if this happens. Brushing and flossing your teeth regularly may help limit this. See your dentist  regularly and inform your dentist of the medicines you are taking. If you are going to have elective surgery, you may need to stop using this medicine before the surgery. Consult your health care professional for advice. This medicine does not protect you against HIV infection (AIDS) or any other sexually transmitted diseases. What side effects may I notice from receiving this medicine? Side effects that you should report to your doctor or health care professional as soon as possible: -breast tissue changes or discharge -changes in vaginal bleeding during your period or between your periods -chest pain -coughing up blood -dizziness or fainting spells -headaches or migraines -leg, arm or groin pain -severe or sudden headaches -stomach pain (severe) -sudden shortness of breath -sudden loss of coordination, especially on one side of the body -speech problems -symptoms of vaginal infection like itching, irritation or unusual discharge -tenderness in the upper abdomen -vomiting -weakness or numbness in the arms or legs, especially on one side of the body -yellowing of the eyes or skin Side effects that usually do not require medical attention (report to your doctor or health care professional if they continue or are bothersome): -breakthrough bleeding and spotting that continues beyond the 3 initial cycles of pills -breast tenderness -mood changes, anxiety, depression, frustration, anger, or emotional outbursts -increased sensitivity to sun or ultraviolet light -nausea -skin rash, acne, or brown spots on the skin -weight gain (slight) This list may not describe all possible side effects. Call your doctor for medical advice about side effects. You may report side effects to FDA at 1-800-FDA-1088. Where should I keep my medicine? Keep out of the reach of children. Store at room temperature between 15 and 30 degrees C (59 and 86 degrees F) for up to 4 months. The product will expire after 4  months. Protect from light. Throw away any unused medicine after the expiration date. NOTE: This sheet is a summary. It may not cover all possible information. If you have questions about this medicine, talk to your doctor, pharmacist, or health care provider.  2015, [employer]. (2008-03-18 12:03:58)

## 2013-12-29 NOTE — Progress Notes (Signed)
Chief Complaint  Patient presents with  . Advice Only    was doing Depo-Provera shots, felt like this was causing her vaginal dryness and giving her cramping. Would like to discuss options.    She is interested in changing birth control to something that will give her regular periods.  Doesn't feel right not having cycles.  She has been on Depo Provera for about a year.  Last injection was 09/2013, was due for another last week.She has been using condoms. She is complaining of vaginal dryness, ever since being on the injections.  She intermittently would having cramping like a period (but no bleeding).  She previously took OCP's--had issues remembering to take medication daily (and last pregnancy occurred while on pills, due to missed pills).  She thinks she would be much better now about taking a pill daily. She did not have any side effects or complications, nor is there h/o migraines, or family h/o problems with OCP's or pregnancy.  She is a nonsmoker.  GERD:  Much improved.  She has symptoms every 2-3 weeks, and uses prevacid prn.  History   Social History  . Marital Status: Single    Spouse Name: N/A    Number of Children: N/A  . 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 Smoking status: Former Smoker -- 0.50 packs/day for 3 years    Types: Cigarettes    Quit date: 08/14/2013  . Smokeless tobacco: Never Used     Comment: increased to 3 pack/week  . Alcohol Use: Yes     Comment: Occasional social drinker, every other weekend  . Drug Use: No  . Sexual Activity: Yes    Partners: Male    Birth Control/ Protection: Injection     Comment: (due last week--didn't get; last injection 09/2013)   Other Topics Concern  . 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 No pets.  No passive tobacco exposure.    Current Outpatient  Prescriptions on File Prior to Visit  Medication Sig Dispense Refill  . medroxyPROGESTERone (DEPO-PROVERA) 150 MG/ML injection Inject 150 mg into the muscle every 3 (three) months.      Marland Kitchen PROAIR HFA 108 (90 BASE) MCG/ACT inhaler Inhale 1-2 puffs into the lungs every 4 (four) hours as needed.        No current facility-administered medications on file prior to visit.   No Known Allergies  ROS:  No fevers, chills, URI symptoms chest pain, shortness of breath, rashes.  +vaginal dryness.  No vaginal bleeding; occasional cramping.  Amenorrheic related to Depo Provera injections.  Moods are good.  Reflux is controlled. No nausea, vomiting, bowel changes.  PHYSICAL EXAM: BP 128/88  Pulse 92  Ht  (1.702 m)  Wt 179 lb (81.194 kg)  BMI 28.03 kg/m2 Well developed, pleasant female in no distress. Remainder of visit was counseling, no further exam  ASSESSMENT/PLAN:  Other general counseling and advice for contraceptive management  Need for prophylactic vaccination and inoculation against influenza - Plan: Flu Vaccine QUAD 36+ mos PF IM (Fluarix Quad PF)   Risks/side effects of OCP's, Nuva Ring and patch were reviewed. Discussed [employer] study (vaginal ring) and she is interested. Will fax sheet. If she is not a candidate or prefers not to do study, will call and let us know if  she prefers Chief Operating Officer.  Advised of proper way to take.  No contraindications. Discussed condom use and Plan B until then Discussed need to overlap condom use for a month All questions were answered

## 2013-12-30 ENCOUNTER — Encounter: Payer: Self-pay | Admitting: Family Medicine

## 2013-12-30 ENCOUNTER — Ambulatory Visit (INDEPENDENT_AMBULATORY_CARE_PROVIDER_SITE_OTHER): Payer: BC Managed Care – PPO | Admitting: Family Medicine

## 2013-12-30 VITALS — BP 114/74 | HR 92 | Ht 67.0 in | Wt 179.0 lb

## 2013-12-30 DIAGNOSIS — R079 Chest pain, unspecified: Secondary | ICD-10-CM

## 2013-12-30 DIAGNOSIS — F410 Panic disorder [episodic paroxysmal anxiety] without agoraphobia: Secondary | ICD-10-CM

## 2013-12-30 DIAGNOSIS — R42 Dizziness and giddiness: Secondary | ICD-10-CM

## 2013-12-30 LAB — POCT URINE PREGNANCY: PREG TEST UR: NEGATIVE

## 2013-12-30 MED ORDER — ALPRAZOLAM 0.25 MG PO TABS: 0.2500 mg | ORAL_TABLET | Freq: Three times a day (TID) | ORAL | Status: DC | PRN

## 2013-12-30 NOTE — Progress Notes (Signed)
Chief Complaint  Patient presents with  . Chest Pain    described as chest pressure, feels SOB, bp at school was 134/86. Feels dizzy from time to time. Also felt like her heart was racing in her chest.    While in class today she started feeling light headed and developed some pressure in her chest. She checked her BP in the hallway, and it was 134/86.  She was feeling panicky at that time, as well as still dizzy, and heart was racing.  This has been occuring on and off since 10am today.  Nothing in particular triggered the dizziness.  She had eaten a bagel, and drank a bottle of water prior to the light headedness starting (so not due to low sugar or dehydration).  The dizziness has resolved, but she has ongoing chest pressure.  Heart is no longer racing.  Dizziness lasted 30 minutes--she had gone home and took a nap.  When she woke up and tried to have a snack, light headedness recurred, laid back down again and felt better.   Earlier she felt like the pressure on her chest "made her breathe faster." Currently has a mild "weight on her chest"  She had a similar problem over the summer (lightheaded, pressure on her chest), when she had just finished eating.  She went to ER for that. CXR was normal.  She also had EKG, d-dimer, CBC.  This is only the second similar episode.  Past Medical History  Diagnosis Date  . Migraine     rare  . Alopecia     getting injections through dermatologist   No past surgical history on file. History   Social History  . Marital Status: Single    Spouse Name: N/A    Number of Children: N/A  . 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 Smoking status: Former Smoker -- 0.50 packs/day for 3 years    Types: Cigarettes    Quit date: 08/14/2013  . Smokeless tobacco: Never Used     Comment: increased to 3 pack/week  . Alcohol Use: Yes     Comment: Occasional social drinker, every other  weekend  . Drug Use: No  . Sexual Activity: Yes    Partners: Male    Birth Control/ Protection: Injection     Comment: (due last week--didn't get; last injection 09/2013)   Other Topics Concern  . 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 No pets.  No passive tobacco exposure.    Outpatient Encounter Prescriptions as of 12/30/2013  Medication Sig  . ALPRAZolam (XANAX) 0.25 MG tablet Take 1 tablet (0.25 mg total) by mouth 3 (three) times daily as needed for anxiety.  Marland Kitchen PROAIR HFA 108 (90 BASE) MCG/ACT inhaler Inhale 1-2 puffs into the lungs every 4 (four) hours as needed.   . [DISCONTINUED] medroxyPROGESTERone (DEPO-PROVERA) 150 MG/ML injection Inject 150 mg into the muscle every 3 (three) months.   No Known Allergies  ROS:  No fevers, chills, headache, URI symptoms, nausea, vomiting, diarrhea.  +dizziness, chest pressure and palpitations, as per HPI.  No bleeding, bruising, rash or other complaints. No numbness, tingling, weakness.  PHYSICAL EXAM: BP 120/74  Pulse 96  Ht  (1.702 m)  Wt 179 lb (81.194 kg)  BMI 28.03 kg/m2  SpO2 98% Well developed,  pleasant female who doesn't appear to be particularly anxious HEENT:  PERRL, EOMI, conjunctiva clear. OP clear moist mucus membranes Neck: no lymphadenopathy, thyromegaly or mass Heart: regular rate and rhythm, no murmur Lungs: clear bilaterally Abdomen: soft, nontender, no organomegaly or mass Extremities no edema, 2+ pulse Neuro: alert and oriented.  Cranial nerves intact. Normal strength, sensation, gait.   112/52 P80 lying 124/80 P88 sitting 114/74 P 92 standing  Urine pregnany test negative  ASSESSMENT/PLAN:  Panic attack - Plan: ALPRAZolam (XANAX) 0.25 MG tablet  Dizziness and giddiness - Plan: POCT urine pregnancy  Chest pain, unspecified chest pain type - likely related to anxiety. not reproducible  Relaxation and  grounding techniques reviewed.  rx for xanax given to use prn recurrences. Risks and side effects reviewed. If having frequent panic attacks/anxiety, may need daily/preventative medication (not at this point).  Pt reassured.

## 2013-12-30 NOTE — Patient Instructions (Signed)
Drink plenty of fluids. I believe that you feeling dizzy (for ?reason) triggered a panic attack. Work on some relaxation and grounding techniques if this should recur (anxiety), and use a paper bag to breathe into if you are hyperventilating.  Panic Attacks Panic attacks are sudden, short-livedsurges of severe anxiety, fear, or discomfort. They may occur for no reason when you are relaxed, when you are anxious, or when you are sleeping. Panic attacks may occur for a number of reasons:   Healthy people occasionally have panic attacks in extreme, life-threatening situations, such as war or natural disasters. Normal anxiety is a protective mechanism of the body that helps [place] react to danger (fight or flight response).  Panic attacks are often seen with anxiety disorders, such as panic disorder, social anxiety disorder, generalized anxiety disorder, and phobias. Anxiety disorders cause excessive or uncontrollable anxiety. They may interfere with your relationships or other life activities.  Panic attacks are sometimes seen with other mental illnesses, such as depression and posttraumatic stress disorder.  Certain medical conditions, prescription medicines, and drugs of abuse can cause panic attacks. SYMPTOMS  Panic attacks start suddenly, peak within 20 minutes, and are accompanied by four or more of the following symptoms:  Pounding heart or fast heart rate (palpitations).  Sweating.  Trembling or shaking.  Shortness of breath or feeling smothered.  Feeling choked.  Chest pain or discomfort.  Nausea or strange feeling in your stomach.  Dizziness, light-headedness, or feeling like you will faint.  Chills or hot flushes.  Numbness or tingling in your lips or hands and feet.  Feeling that things are not real or feeling that you are not yourself.  Fear of losing control or going crazy.  Fear of dying. Some of these symptoms can mimic serious medical conditions. For example, you may  think you are having a heart attack. Although panic attacks can be very scary, they are not life threatening. DIAGNOSIS  Panic attacks are diagnosed through an assessment by your health care provider. Your health care provider will ask questions about your symptoms, such as where and when they occurred. Your health care provider will also ask about your medical history and use of alcohol and drugs, including prescription medicines. Your health care provider may order blood tests or other studies to rule out a serious medical condition. Your health care provider may refer you to a mental health professional for further evaluation. TREATMENT   Most healthy people who have one or two panic attacks in an extreme, life-threatening situation will not require treatment.  The treatment for panic attacks associated with anxiety disorders or other mental illness typically involves counseling with a mental health professional, medicine, or a combination of both. Your health care provider will help determine what treatment is best for you.  Panic attacks due to physical illness usually go away with treatment of the illness. If prescription medicine is causing panic attacks, talk with your health care provider about stopping the medicine, decreasing the dose, or substituting another medicine.  Panic attacks due to alcohol or drug abuse go away with abstinence. Some adults need professional help in order to stop drinking or using drugs. HOME CARE INSTRUCTIONS   Take all medicines as directed by your health care provider.   Schedule and attend follow-up visits as directed by your health care provider. It is important to keep all your appointments. SEEK MEDICAL CARE IF:  You are not able to take your medicines as prescribed.  Your symptoms do not improve  or get worse. SEEK IMMEDIATE MEDICAL CARE IF:   You experience panic attack symptoms that are different than your usual symptoms.  You have serious  thoughts about hurting yourself or others.  You are taking medicine for panic attacks and have a serious side effect. MAKE SURE YOU:  Understand these instructions.  Will watch your condition.  Will get help right away if you are not doing well or get worse. Document Released: 04/02/2005 Document Revised: 04/07/2013 Document Reviewed: 11/14/2012 [facility] Patient Information 2015 [place], [place]. This information is not intended to replace advice given to you by your health care provider. Make sure you discuss any questions you have with your health care provider.

## 2014-02-15 ENCOUNTER — Encounter: Payer: Self-pay | Admitting: Family Medicine

## 2014-02-16 ENCOUNTER — Encounter: Payer: Self-pay | Admitting: Family Medicine

## 2014-02-16 ENCOUNTER — Ambulatory Visit (INDEPENDENT_AMBULATORY_CARE_PROVIDER_SITE_OTHER): Payer: BC Managed Care – PPO | Admitting: Family Medicine

## 2014-02-16 VITALS — BP 110/70 | HR 93 | Temp 98.8°F | Wt 184.0 lb

## 2014-02-16 DIAGNOSIS — R0789 Other chest pain: Secondary | ICD-10-CM

## 2014-02-16 NOTE — Progress Notes (Signed)
   Subjective:    Patient ID: Elizabeth Williamson, female    DOB: [birth year], 23 y.o.   MRN: [id number]  HPI She complains of a three-day history of chest tightness and lumps in her neck area. She does not presently have any sinus symptoms. She's had no cough, congestion, itchy watery eyes, earache. She has no underlying allergies and does not smoke.   Review of Systems     Objective:   Physical Exam alert and in no distress. Tympanic membranes and canals are normal. Throat is clear. Tonsils are normal. Neck is supple without adenopathy or thyromegaly. Cardiac exam shows a regular sinus rhythm without murmurs or gallops. Lungs are clear to auscultation.        Assessment & Plan:  Chest tightness I explained that I found no evidence of any major problem. Reassured her to that and recommend supportive care.

## 2014-04-22 ENCOUNTER — Ambulatory Visit (INDEPENDENT_AMBULATORY_CARE_PROVIDER_SITE_OTHER): Payer: BLUE CROSS/BLUE SHIELD | Admitting: Family Medicine

## 2014-04-22 ENCOUNTER — Encounter: Payer: Self-pay | Admitting: Family Medicine

## 2014-04-22 VITALS — BP 118/74 | HR 84 | Ht 67.0 in | Wt 185.0 lb

## 2014-04-22 DIAGNOSIS — Z30011 Encounter for initial prescription of contraceptive pills: Secondary | ICD-10-CM

## 2014-04-22 DIAGNOSIS — N912 Amenorrhea, unspecified: Secondary | ICD-10-CM

## 2014-04-22 LAB — POCT URINE PREGNANCY: PREG TEST UR: NEGATIVE

## 2014-04-22 MED ORDER — NORGESTIM-ETH ESTRAD TRIPHASIC 0.18/0.215/0.25 MG-35 MCG PO TABS: 1.0000 | ORAL_TABLET | Freq: Every day | ORAL | Status: DC

## 2014-04-22 NOTE — Progress Notes (Signed)
Chief Complaint  Patient presents with  . Advice Only    concerned that she has not had a period since stopping depo in August 2015. Has taken home pregnancy test 2 weeks ago that was negative. Does complain of nausea from time to time and breast tenderness.    She had been on Depo Provera for about 1.5 years before stopping it--last injection was June 2015.  She had also been on it prior to that .  She wasn't having side effects, still doesn't want to be pregnant, but didn't want to remain on the Depo Provera injections.  She had some weight gain and vaginal dryness while on it. Hasn't had any vaginal spotting, discharge, odor, itch.  She has had breast tenderness over the last couple of months, it was intermittent at first, but now seems to be more constant. She had a negative home pregnancy test a couple of weeks ago.  She is married, using condoms.  She might consider pregnancy next year. Her periods were pretty regular prior to being on Depo Provera She quit smoking in 08/2013. Last CPE was 03/2013  She gained weight on Depo, no further gain since stopping. She previously took OCP's without side effects, but had trouble remembering to take it.  She thinks she would have an easier time being compliant now (ie using phone for reminders).  She hasn't had any significant migraines. There is no family h/o blood clots, strokes or other problems related to hormones.  PMH, PSH, SH reviewed.  Meds:  Albuterol prn.  Not currently using any alprazolam No Known Allergies  ROS:  No fevers, chills, URI symptoms, cough, shortness of breath, chest pain, dizziness, headaches, nausea.  +breast tenderness.  No joint pains, edema, GI complaints, vaginal discharge, urinary complaints.  PHYSICAL EXAM: BP 118/74 mmHg  Pulse 84  Ht 5\' 7"  (1.702 m)  Wt 185 lb (83.915 kg)  BMI 28.97 kg/m2  LMP  Well developed, pleasant female in no distress Skin is clear. Alert and oriented with normal CN's, gait No  edema Normal mood, affect, hygiene and grooming  Urine pregnancy test: negative  ASSESSMENT/PLAN:  Absence of menstruation - Plan: POCT urine pregnancy  Encounter for initial prescription of contraceptive pills - Plan: Norgestimate-Ethinyl Estradiol Triphasic (ORTHO TRI-CYCLEN, 28,) 0.18/0.215/0.25 MG-35 MCG tablet   Discussed options--observation/waiting longer and expect that menses will return within the next few month; starting OCP's to regulate menses--discussed 3 month trial, vs using for longer for contraception;  trial of provera--do not expect it to be effective, likely needs estrogen.   Risks/side effects of OCP's reviewed in detail.  To stop taking them if has recurrent migraines. F/u if no period after 2 packs, sooner if any problems.  CPE 6 months Reminded to use condoms for the first month, and with any antibiotics or missed pills.

## 2014-04-22 NOTE — Patient Instructions (Signed)
Start your birth control pills Sunday.  Continue to use condoms for the entire first package.  If by the 2nd pack you haven't had your period (at the end), please follow up to discuss. If your migraines recur while on the pills, we might need to stop them. If/when you desire pregnancy, stop the pills 2-3 months prior, and change over to taking a prenatal vitamin once daily instead, along with condoms.

## 2014-05-05 ENCOUNTER — Other Ambulatory Visit (INDEPENDENT_AMBULATORY_CARE_PROVIDER_SITE_OTHER): Payer: BLUE CROSS/BLUE SHIELD

## 2014-05-05 DIAGNOSIS — Z111 Encounter for screening for respiratory tuberculosis: Secondary | ICD-10-CM

## 2014-05-05 DIAGNOSIS — Z23 Encounter for immunization: Secondary | ICD-10-CM

## 2014-05-06 ENCOUNTER — Other Ambulatory Visit: Payer: BLUE CROSS/BLUE SHIELD

## 2014-06-21 ENCOUNTER — Ambulatory Visit: Payer: BLUE CROSS/BLUE SHIELD | Admitting: Family Medicine

## 2014-06-22 ENCOUNTER — Other Ambulatory Visit (INDEPENDENT_AMBULATORY_CARE_PROVIDER_SITE_OTHER): Payer: BLUE CROSS/BLUE SHIELD

## 2014-06-22 DIAGNOSIS — Z23 Encounter for immunization: Secondary | ICD-10-CM | POA: Diagnosis not present

## 2014-06-24 LAB — TB SKIN TEST: TB SKIN TEST: NEGATIVE

## 2014-07-12 ENCOUNTER — Telehealth: Payer: Self-pay | Admitting: Family Medicine

## 2014-07-12 DIAGNOSIS — Z30011 Encounter for initial prescription of contraceptive pills: Secondary | ICD-10-CM

## 2014-07-12 MED ORDER — NORGESTIM-ETH ESTRAD TRIPHASIC 0.18/0.215/0.25 MG-35 MCG PO TABS: 1.0000 | ORAL_TABLET | Freq: Every day | ORAL | Status: DC

## 2014-07-12 NOTE — Telephone Encounter (Signed)
Refilled for 90 days no refill

## 2014-07-12 NOTE — Telephone Encounter (Signed)
She was given 6 month supply in January (monthly), so should have refills.  Are they asking to change to 90 day supply instead?? If so (if that saves money, then okay for 90 day once, no refill)

## 2014-07-12 NOTE — Telephone Encounter (Signed)
Faxed RX refill request for Tri-Previfem tab 0.18/0.215/0.25 mg-35 MCG(28) 90 day refill request from [facility] [address]

## 2014-07-19 ENCOUNTER — Other Ambulatory Visit (HOSPITAL_COMMUNITY)
Admission: RE | Admit: 2014-07-19 | Discharge: 2014-07-19 | Disposition: A | Discharge status: Home / Self Care | Payer: BLUE CROSS/BLUE SHIELD | Source: Ambulatory Visit | Attending: Family Medicine | Admitting: Family Medicine

## 2014-07-19 ENCOUNTER — Ambulatory Visit (INDEPENDENT_AMBULATORY_CARE_PROVIDER_SITE_OTHER): Payer: BLUE CROSS/BLUE SHIELD | Admitting: Family Medicine

## 2014-07-19 ENCOUNTER — Encounter: Payer: Self-pay | Admitting: Family Medicine

## 2014-07-19 VITALS — BP 110/72 | HR 84 | Ht 66.5 in | Wt 173.8 lb

## 2014-07-19 DIAGNOSIS — J45909 Unspecified asthma, uncomplicated: Secondary | ICD-10-CM

## 2014-07-19 DIAGNOSIS — R5383 Other fatigue: Secondary | ICD-10-CM | POA: Diagnosis not present

## 2014-07-19 DIAGNOSIS — Z01419 Encounter for gynecological examination (general) (routine) without abnormal findings: Secondary | ICD-10-CM | POA: Diagnosis present

## 2014-07-19 DIAGNOSIS — Z0000 Encounter for general adult medical examination without abnormal findings: Secondary | ICD-10-CM

## 2014-07-19 DIAGNOSIS — Z113 Encounter for screening for infections with a predominantly sexual mode of transmission: Secondary | ICD-10-CM | POA: Insufficient documentation

## 2014-07-19 DIAGNOSIS — L301 Dyshidrosis [pompholyx]: Secondary | ICD-10-CM

## 2014-07-19 DIAGNOSIS — K5900 Constipation, unspecified: Secondary | ICD-10-CM | POA: Diagnosis not present

## 2014-07-19 DIAGNOSIS — J302 Other seasonal allergic rhinitis: Secondary | ICD-10-CM

## 2014-07-19 LAB — CBC WITH DIFFERENTIAL/PLATELET
BASOS ABS: 0 10*3/uL (ref 0.0–0.1)
BASOS PCT: 1 % (ref 0–1)
EOS PCT: 8 % — AB (ref 0–5)
Eosinophils Absolute: 0.4 10*3/uL (ref 0.0–0.7)
HEMATOCRIT: 38.9 % (ref 36.0–46.0)
HEMOGLOBIN: 12.3 g/dL (ref 12.0–15.0)
LYMPHS PCT: 46 % (ref 12–46)
Lymphs Abs: 2 10*3/uL (ref 0.7–4.0)
MCH: 27.6 pg (ref 26.0–34.0)
MCHC: 31.6 g/dL (ref 30.0–36.0)
MCV: 87.2 fL (ref 78.0–100.0)
MONO ABS: 0.4 10*3/uL (ref 0.1–1.0)
MPV: 11.2 fL (ref 8.6–12.4)
Monocytes Relative: 10 % (ref 3–12)
Neutro Abs: 1.5 10*3/uL — ABNORMAL LOW (ref 1.7–7.7)
Neutrophils Relative %: 35 % — ABNORMAL LOW (ref 43–77)
PLATELETS: 244 10*3/uL (ref 150–400)
RBC: 4.46 MIL/uL (ref 3.87–5.11)
RDW: 13.5 % (ref 11.5–15.5)
WBC: 4.4 10*3/uL (ref 4.0–10.5)

## 2014-07-19 LAB — POCT URINALYSIS DIPSTICK
Blood, UA: NEGATIVE
Glucose, UA: NEGATIVE
Ketones, UA: NEGATIVE
Leukocytes, UA: NEGATIVE
Nitrite, UA: NEGATIVE
Spec Grav, UA: >=1.03
Urobilinogen, UA: NEGATIVE
pH, UA: 5.5

## 2014-07-19 LAB — TB SKIN TEST
Induration: 0 mm
TB Skin Test: NEGATIVE

## 2014-07-19 MED ORDER — ALBUTEROL SULFATE HFA 108 (90 BASE) MCG/ACT IN AERS: 1.0000 | INHALATION_SPRAY | RESPIRATORY_TRACT | Status: DC | PRN

## 2014-07-19 MED ORDER — FLUTICASONE PROPIONATE 50 MCG/ACT NA SUSP: 2.0000 | Freq: Every day | NASAL | Status: DC

## 2014-07-19 MED ORDER — MONTELUKAST SODIUM 10 MG PO TABS: 10.0000 mg | ORAL_TABLET | Freq: Every day | ORAL | Status: DC

## 2014-07-19 NOTE — Patient Instructions (Addendum)
HEALTH MAINTENANCE RECOMMENDATIONS:  It is recommended that you get at least 30 minutes of aerobic exercise at least 5 days/week (for weight loss, you may need as much as 60-90 minutes). This can be any activity that gets your heart rate up. This can be divided in 10-15 minute intervals if needed, but try and build up your endurance at least once a week.  Weight bearing exercise is also recommended twice weekly.  Eat a healthy diet with lots of vegetables, fruits and fiber.  "Colorful" foods have a lot of vitamins (ie green vegetables, tomatoes, red peppers, etc).  Limit sweet tea, regular sodas and alcoholic beverages, all of which has a lot of calories and sugar.  Up to 1 alcoholic drink daily may be beneficial for women (unless trying to lose weight, watch sugars).  Drink a lot of water.  Calcium recommendations are 1200-1500 mg daily (1500 mg for postmenopausal women or women without ovaries), and vitamin D 1000 IU daily.  This should be obtained from diet and/or supplements (vitamins), and calcium should not be taken all at once, but in divided doses.  Monthly self breast exams and yearly mammograms for women over the age of 48 is recommended.  Sunscreen of at least SPF 30 should be used on all sun-exposed parts of the skin when outside between the hours of 10 am and 4 pm (not just when at beach or pool, but even with exercise, golf, tennis, and yard work!)  Use a sunscreen that says "broad spectrum" so it covers both UVA and UVB rays, and make sure to reapply every 1-2 hours.  Remember to change the batteries in your smoke detectors when changing your clock times in the spring and fall.  Use your seat belt every time you are in a car, and please drive safely and not be distracted with cell phones and texting while driving.  Constipation--continue daily benefiber.  Continue to get at least 8 glasses of water daily. Continue to try and eat a high fiber diet--lots of fruit and vegetables, and a  high fiber cereal.  Consider using herbal teas like Smoothe Move. Also consider taking probiotics daily (ie Align).  If still a problem, consider taking Miralax 1/2 capful daily, but cut back if stools are too frequent or too loose. Some people only need a full dose once or twice weekly to help keep them regular.  Moisturize your hands well (and frequently). Use hydrocortisone cream (ie Cortaid) as needed for itching and rash.  Allergies--start taking montelukast once daily.  This can take up to 2 weeks to have its full effect. You may also take claritin or zyrtec along with it at first.  This should help both with allergies and wheezing.  If it doesn't help enough with the allergies, then start taking Flonase daily.  [facility]  Constipation Constipation is when a person has fewer than three bowel movements a week, has difficulty having a bowel movement, or has stools that are dry, hard, or larger than normal. As people grow older, constipation is more common. If you try to fix constipation with medicines that make you have a bowel movement (laxatives), the problem may get worse. Long-term laxative use may cause the muscles of the colon to become weak. A low-fiber diet, not taking in enough fluids, and taking certain medicines may make constipation worse.  CAUSES   Certain medicines, such as antidepressants, pain medicine, iron supplements, antacids, and water pills.   Certain diseases,  such as diabetes, irritable bowel syndrome (IBS), thyroid disease, or depression.   Not drinking enough water.   Not eating enough fiber-rich foods.   Stress or travel.   Lack of physical activity or exercise.   Ignoring the urge to have a bowel movement.   Using laxatives too much.  SIGNS AND SYMPTOMS   Having fewer than three bowel movements a week.   Straining to have a bowel movement.   Having stools that are hard, dry, or larger than normal.   Feeling full  or bloated.   Pain in the lower abdomen.   Not feeling relief after having a bowel movement.  DIAGNOSIS  Your health care provider will take a medical history and perform a physical exam. Further testing may be done for severe constipation. Some tests may include:  A barium enema X-ray to examine your rectum, colon, and, sometimes, your small intestine.   A sigmoidoscopy to examine your lower colon.   A colonoscopy to examine your entire colon. TREATMENT  Treatment will depend on the severity of your constipation and what is causing it. Some dietary treatments include drinking more fluids and eating more fiber-rich foods. Lifestyle treatments may include regular exercise. If these diet and lifestyle recommendations do not help, your health care provider may recommend taking over-the-counter laxative medicines to help you have bowel movements. Prescription medicines may be prescribed if over-the-counter medicines do not work.  HOME CARE INSTRUCTIONS   Eat foods that have a lot of fiber, such as fruits, vegetables, whole grains, and beans.  Limit foods high in fat and processed sugars, such as french fries, hamburgers, cookies, candies, and soda.   A fiber supplement may be added to your diet if you cannot get enough fiber from foods.   Drink enough fluids to keep your urine clear or pale yellow.   Exercise regularly or as directed by your health care provider.   Go to the restroom when you have the urge to go. Do not hold it.   Only take over-the-counter or prescription medicines as directed by your health care provider. Do not take other medicines for constipation without talking to your health care provider first.  SEEK IMMEDIATE MEDICAL CARE IF:   You have bright red blood in your stool.   Your constipation lasts for more than 4 days or gets worse.   You have abdominal or rectal pain.   You have thin, pencil-like stools.   You have unexplained weight  loss. MAKE SURE YOU:   Understand these instructions.  Will watch your condition.  Will get help right away if you are not doing well or get worse. Document Released: 12/30/2003 Document Revised: 04/07/2013 Document Reviewed: 01/12/2013 [facility] Patient Information 2015 [place], [place]. This information is not intended to replace advice given to you by your health care provider. Make sure you discuss any questions you have with your health care provider.

## 2014-07-19 NOTE — Progress Notes (Signed)
Chief Complaint  Patient presents with  . Annual Exam    nonfasting annual exam, would like pap(last 03-31-15). UA showed trace protein and 1+ bilirubin, no urinary symptoms. She is going to be without insurance as of 08/15/14-turning 24 and will no longer be on mother's plan. Had eye exam with Dr Hyacinth Meeker end of January 2016. Probably will not have insurance again until year. Has had some bumps on her fingers over a year that come and go.    Elizabeth Williamson is a 24 y.o. female who presents for a complete physical.  She has the following concerns:  Follow up on irregular/absent menses since stopping Depo Provera. She was started on OCP's in January.  She took it for a little over a month, but stopped it when she started getting headaches.  Periods have been normal since stopping the pills, headaches resolved.  She is using condoms for contraception.  She has some bumps on her fingers that come and go .She had them in the past, but have recurred.  She went to ask her derm, but they were gone when she had her visit.  Sometimes they itch, and sometimes her skin is dry.  She once went 3 weeks without a bowel movement.  Felt fine until the last few days, where she felt bloated.  She finally had to take a laxative. She started adding benefiber to her water, but still typically has a bowel movement every 2 weeks, sometimes longer.  Sometimes a Dieter's Tea helps.  Stools are never hard, no straining.  Allergies:  She has tried claritin and zyrtec and they don't seem to work well for her. She has never used a nasal steroid spray.  She finds that when her allergies are flaring (like they are now), that she has some shortness of breath and wheezing, and needs to use her albuterol inhaler, especially in the evening.  She has been using it about twice a week. She used to need the inhaler prior to exercise, but she hasn't needed that in a while. She needs a refill.   Immunization History  Administered Date(s)  Administered  . Influenza Split 03/06/2012, 02/14/2013  . Influenza,inj,Quad PF,36+ Mos 12/29/2013  . PPD Test 05/06/2013, 05/05/2014, 06/22/2014   She reports having Tdap through [facility], and PPD also done through [facility].  Gets yearly flu shots at work She reports getting HPV series in high school Last Pap smear: 03/2013--normal, negative chlamydia Last mammogram: n/a  Last colonoscopy: n/a  Last DEXA: n/a  Dentist: twice yearly  Ophtho: yearly; wears glasses  Exercise: 4 days/week (aerobics, and strength 2-3x/week). Goes to the gym at her apartment complex, sometimes takes a class at [facility]. Lipids were normal in 2013: Lab Results  Component Value Date   CHOL 196 03/06/2012   HDL 86 03/06/2012   LDLCALC 101* 03/06/2012   TRIG 43 03/06/2012   CHOLHDL 2.3 03/06/2012   Past Medical History  Diagnosis Date  . Migraine     rare  . Alopecia     got injections through dermatologist    History reviewed. No pertinent past surgical history.  History   Social History  . Marital Status: Married    Spouse Name: N/A  . Number of Children: N/A  . 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 Smoking status: Former Smoker -- 0.50 packs/day for 3 years    Types: Cigarettes  Quit date: 08/14/2013  . Smokeless tobacco: Never Used     Comment: increased to 3 pack/week  . Alcohol Use: 0.0 oz/week    0 Standard drinks or equivalent per week     Comment: Occasional social drinker, every other weekend  . Drug Use: No  . Sexual Activity:    Partners: Male    Birth Control/ Protection: Condom   Other Topics Concern  . 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 No pets.  No passive tobacco exposure.     Family History  Problem Relation Age of Onset  . Heart disease Brother     recently found  to have enlarged heart (age 75)  . Diabetes Maternal Grandmother   . Cancer Maternal Grandfather     colon cancer (60's)  . Hypertension Paternal Grandmother   . Cancer Paternal Uncle     lung cancer (nonsmoker)   Meds:  Albuterol HFA prn  No Known Allergies  ROS: The patient denies anorexia, fever, headaches (had some on OCP's, resolved after stopping), vision changes, decreased hearing, ear pain, sore throat, breast concerns, chest pain, palpitations, dizziness, syncope, cough, swelling, nausea, vomiting, diarrhea, abdominal pain, melena, hematochezia, indigestion/heartburn, hematuria, incontinence, dysuria, vaginal discharge, odor or itch, genital lesions, joint pains, weakness, tremor, suspicious skin lesions, depression, anxiety, abnormal bleeding/bruising, or enlarged lymph nodes.  Numbness/tingling in L arm, unchanged, comes and goes. Denies any weakness, just tingling. Tingling is mainly in hand, 2-5th fingers (thumb not affected), unchanged for at least a few years. +intentional weight loss--down 11# in the last 3 months. +constipation/infrequent stools as per HPI Rash on hands as per HPI Wheezing and allergies flaring  PHYSICAL EXAM:  BP 110/72 mmHg  Pulse 84  Ht 5' 6.5" (1.689 m)  Wt 173 lb 12.8 oz (78.835 kg)  BMI 27.64 kg/m2  LMP 07/07/2014  General Appearance:  Alert, cooperative, no distress, appears stated age   Head:  Normocephalic, without obvious abnormality, atraumatic   Eyes:  PERRL, conjunctiva/corneas clear, EOM's intact, fundi  benign   Ears:  Normal TM's and external ear canals   Nose:  Nares normal, mucosa mild-moderately edematous, no drainage or sinus tenderness   Throat:  Lips, mucosa, and tongue normal; teeth and gums normal; mild cobblestoning posteriorly  Neck:  Supple, no lymphadenopathy; thyroid: no enlargement/tenderness/nodules; no carotid  bruit or JVD   Back:  Spine nontender, no curvature, ROM normal, no CVA tenderness    Lungs:  Clear to auscultation bilaterally without wheezes, rales or ronchi; respirations unlabored   Chest Wall:  No tenderness or deformity   Heart:  Regular rate and rhythm, S1 and S2 normal, no murmur, rub  or gallop   Breast Exam:  No tenderness, masses, or nipple discharge or inversion. No axillary lymphadenopathy   Abdomen:  Soft, non-tender, nondistended, normoactive bowel sounds,  no masses, no hepatosplenomegaly   Genitalia:  Normal external genitalia without lesions. BUS and vagina normal; cervix without lesions, or cervical motion tenderness. No abnormal discharge. Slightly friable cervix. Uterus and adnexa not enlarged, nontender, no masses. Pap performed   Rectal:  Not performed due to age<40 and no related complaints   Extremities:  No clubbing, cyanosis or edema   Pulses:  2+ and symmetric all extremities   Skin:  Skin color, texture, turgor normal, no lesions. +tattoos. Thinning on top of scalp-no large patches of alopecia. Fine papules  vs small vesicles on the sides of a few of her fingers, most notable on her left index finger, on radial aspect.  No erythema, crusting, swelling, or pigmentation.  Lymph nodes:  Cervical, supraclavicular, and axillary nodes normal   Neurologic:  CNII-XII intact, normal strength, sensation and gait; reflexes 2+ and symmetric throughout    Psych:  Normal mood, affect, hygiene and grooming        ASSESSMENT/PLAN:  Annual physical exam - Plan: POCT Urinalysis Dipstick, CBC with Differential/Platelet, Vit D  25 hydroxy (rtn osteoporosis monitoring), TSH, HIV antibody, Comprehensive metabolic panel, Cytology - PAP Dana  Seasonal allergies - start singulair; continue oral antihistamine. start Flonase if needed - Plan: montelukast (SINGULAIR) 10 MG tablet, fluticasone (FLONASE) 50 MCG/ACT nasal spray, Spirometry with Graph, Spirometry with Graph  Asthma due to seasonal allergies - Singulair  daily.  albuterol prn - Plan: montelukast (SINGULAIR) 10 MG tablet, albuterol (PROAIR HFA) 108 (90 BASE) MCG/ACT inhaler, Spirometry with Graph, Spirometry with Graph  Constipation, unspecified constipation type - reviewed high fiber diet, adequate water intake, probiotics, miralax regularly - Plan: TSH  Other fatigue - Plan: Vit D  25 hydroxy (rtn osteoporosis monitoring)  Dyshidrotic hand dermatitis - moisturize well, hydrocortisone BID prn   Constipation--continue daily benefiber.  Continue to get at least 8 glasses of water daily. Continue to try and eat a high fiber diet--lots of fruit and vegetables, and a high fiber cereal.  Consider using herbal teas like Smoothe Move. Also consider taking probiotics daily (ie Align).  If still a problem, consider taking Miralax 1/2 capful daily, but cut back if stools are too frequent or too loose. Some people only need a full dose once or twice weekly to help keep them regular.  Possible mild dyshidrotic eczema on fingers.  Moisturize well and use OTC HC cream prn.  Allergies with possible RAD/asthma--check PFT's today--NORMAL Start singulair once daily.  May continue OTC antihistamines.  Consider adding Flonase as well.   Discussed monthly self breast exams and yearly mammograms after the age of 26; at least 30 minutes of aerobic activity at least 5 days/week; proper sunscreen use reviewed; healthy diet, including goals of calcium and vitamin D intake and alcohol recommendations (less than or equal to 1 drink/day) reviewed; regular seatbelt use; changing batteries in smoke detectors. Immunization recommendations discussed, UTD. Colonoscopy recommendations reviewed--age 22

## 2014-07-20 LAB — COMPREHENSIVE METABOLIC PANEL
ALBUMIN: 4.3 g/dL (ref 3.5–5.2)
ALK PHOS: 70 U/L (ref 39–117)
ALT: 34 U/L (ref 0–35)
AST: 38 U/L — ABNORMAL HIGH (ref 0–37)
BUN: 7 mg/dL (ref 6–23)
CALCIUM: 9.3 mg/dL (ref 8.4–10.5)
CO2: 25 mEq/L (ref 19–32)
Chloride: 108 mEq/L (ref 96–112)
Creat: 0.58 mg/dL (ref 0.50–1.10)
Glucose, Bld: 91 mg/dL (ref 70–99)
Potassium: 4.4 mEq/L (ref 3.5–5.3)
SODIUM: 141 meq/L (ref 135–145)
TOTAL PROTEIN: 7.1 g/dL (ref 6.0–8.3)
Total Bilirubin: 0.2 mg/dL (ref 0.2–1.2)

## 2014-07-20 LAB — TSH: TSH: 0.516 u[IU]/mL (ref 0.350–4.500)

## 2014-07-20 LAB — HIV ANTIBODY (ROUTINE TESTING W REFLEX): HIV 1&2 Ab, 4th Generation: NONREACTIVE

## 2014-07-20 LAB — VITAMIN D 25 HYDROXY (VIT D DEFICIENCY, FRACTURES): Vit D, 25-Hydroxy: 31 ng/mL (ref 30–100)

## 2014-07-21 ENCOUNTER — Encounter: Payer: Self-pay | Admitting: Family Medicine

## 2014-07-21 LAB — CYTOLOGY - PAP

## 2014-10-25 ENCOUNTER — Encounter: Payer: BLUE CROSS/BLUE SHIELD | Admitting: Family Medicine

## 2015-02-02 ENCOUNTER — Encounter: Payer: Self-pay | Admitting: Family Medicine

## 2015-02-02 ENCOUNTER — Ambulatory Visit (INDEPENDENT_AMBULATORY_CARE_PROVIDER_SITE_OTHER): Payer: BLUE CROSS/BLUE SHIELD | Admitting: Family Medicine

## 2015-02-02 VITALS — BP 128/74 | HR 84 | Ht 66.5 in | Wt 180.2 lb

## 2015-02-02 DIAGNOSIS — Z3490 Encounter for supervision of normal pregnancy, unspecified, unspecified trimester: Secondary | ICD-10-CM

## 2015-02-02 DIAGNOSIS — Z3201 Encounter for pregnancy test, result positive: Secondary | ICD-10-CM | POA: Diagnosis not present

## 2015-02-02 DIAGNOSIS — N912 Amenorrhea, unspecified: Secondary | ICD-10-CM | POA: Diagnosis not present

## 2015-02-02 LAB — POCT URINE PREGNANCY: Preg Test, Ur: POSITIVE — AB

## 2015-02-02 NOTE — Progress Notes (Signed)
Chief Complaint  Patient presents with  . Advice Only    postive pregnancy test-LMP 12/26/14. Does not have a GYN currently.    She started trying for pregnancy since August (stopped using condoms).  LMP 9/11.  2 weeks ago she started with mild nausea at night and in the mornings, developed breast tenderness and felt larger.  She did a home pregnancy test on 10/13, which was positive.  She denies vaginal bleeding or abdominal pain.  She has been taking PNV since the beginning of the year.  Exercise-induced asthma.  She uses albuterol prior to exercise sometimes, not always (she does if her allergies are flaring).  Last used it a month ago.  She was prescribed singulair 6 months ago to help with both allergies and EIA, but admits she only took it for a month.  Hasn't had any breathing issues.  Allergies have been better.  They are worse in the Spring than the Fall. She has been using her Flonase with good results.    PMH, PSH, PSH reviewed.  [id number]  Outpatient Encounter Prescriptions as of 02/02/2015  Medication Sig Note  . albuterol (PROAIR HFA) 108 (90 BASE) MCG/ACT inhaler Inhale 1-2 puffs into the lungs every 4 (four) hours as needed. (Patient not taking: Reported on 02/02/2015)   . fluticasone (FLONASE) 50 MCG/ACT nasal spray Place 2 sprays into both nostrils daily. (Patient not taking: Reported on 02/02/2015)   . montelukast (SINGULAIR) 10 MG tablet Take 1 tablet (10 mg total) by mouth at bedtime. (Patient not taking: Reported on 02/02/2015) 02/02/2015: Hasn't taken since May 2016   No facility-administered encounter medications on file as of 02/02/2015.   No Known Allergies  ROS:  No fever, chills, URI symptoms.  Allergies are controlled.  Mild nausea and breast tenderness per HPI.  No urinary complaints, vaginal discharge, bleeding, abdominal pain, bruising, rash, shortness of breath, chest pain, headaches, dizziness or other complaints.  PHYSICAL EXAM: BP 128/74 mmHg   Pulse 84  Ht 5' 6.5" (1.689 m)  Wt 180 lb 3.2 oz (81.738 kg)  BMI 28.65 kg/m2  LMP 12/26/2014  Well developed, pleasant female in good spirits, in no distress  Urine pregnancy test positive  ASSESSMENT/PLAN:  Pregnancy  Absence of menstruation - Plan: POCT urine pregnancy   [facility] [redacted]wk [facility]    Continue your prenatal vitamin. Continue to use the Flonase during the fall for seasonal allergies. claritin or zyrtec would also be fine to use, if needed. Use the albuterol if needed for wheezing, or prior to exercise as you have done in the past.  [facility] your [facility] OB [facility]. You should expect to get another Tdap vaccine during this pregnancy through their office.  Counseled re: s/sx for which [facility] (cramping, bleeding, etc). Reviewed meds safe in pregnancy All questions answered, handouts given.

## 2015-02-02 NOTE — Patient Instructions (Addendum)
Continue your prenatal vitamin. Continue to use the Flonase during the fall for seasonal allergies. claritin or zyrtec would also be fine to use, if needed. Use the albuterol if needed for wheezing, or prior to exercise as you have done in the past.  Call [facility] to schedule your first OB visit. You should expect to get another Tdap vaccine during this pregnancy through their office.  Due date based on the date of your last period is October 02, 2015    Commonly Asked Questions During Pregnancy  Cats: A parasite can be excreted in cat feces.  To avoid exposure you need to have another person empty the little box.  If you must empty the litter box you will need to wear gloves.  Wash your hands after handling your cat.  This parasite can also be found in raw or undercooked meat so this should also be avoided.  Colds, Sore Throats, Flu: Please check your medication sheet to see what you can take for symptoms.  If your symptoms are unrelieved by these medications please call the office.  Dental Work: Most any dental work Agricultural consultant recommends is permitted.  X-rays should only be taken during the first trimester if absolutely necessary.  Your abdomen should be shielded with a lead apron during all x-rays.  Please notify your provider prior to receiving any x-rays.  Novocaine is fine; gas is not recommended.  If your dentist requires a note from [place] prior to dental work please call the office and we will provide one for you.  Exercise: Exercise is an important part of staying healthy during your pregnancy.  You may continue most exercises you were accustomed to prior to pregnancy.  Later in your pregnancy you will most likely notice you have difficulty with activities requiring balance like riding a bicycle.  It is important that you listen to your body and avoid activities that put you at a higher risk of falling.  Adequate rest and staying well hydrated are a must!  If you have questions  about the safety of specific activities ask your provider.    Exposure to Children with illness: Try to avoid obvious exposure; report any symptoms to [place] when noted,  If you have chicken pos, red measles or mumps, you should be immune to these diseases.   Please do not take any vaccines while pregnant unless you have checked with your OB provider.  Fetal Movement: After 28 weeks we recommend you do "kick counts" twice daily.  Lie or sit down in a calm quiet environment and count your baby movements "kicks".  You should feel your baby at least 10 times per hour.  If you have not felt 10 kicks within the first hour get up, walk around and have something sweet to eat or drink then repeat for an additional hour.  If count remains less than 10 per hour notify your provider.  Fumigating: Follow your pest control agent's advice as to how long to stay out of your home.  Ventilate the area well before re-entering.  Hemorrhoids:   Most over-the-counter preparations can be used during pregnancy.  Check your medication to see what is safe to use.  It is important to use a stool softener or fiber in your diet and to drink lots of liquids.  If hemorrhoids seem to be getting worse please call the office.   Hot Tubs:  Hot tubs Jacuzzis and saunas are not recommended while pregnant.  These increase your internal  body temperature and should be avoided.  Intercourse:  Sexual intercourse is safe during pregnancy as long as you are comfortable, unless otherwise advised by your provider.  Spotting may occur after intercourse; report any bright red bleeding that is heavier than spotting.  Labor:  If you know that you are in labor, please go to the hospital.  If you are unsure, please call the office and let us help you decide what to do.  Lifting, straining, etc:  If your job requires heavy lifting or straining please check with your provider for any limitations.  Generally, you should not lift items heavier than that you  can lift simply with your hands and arms (no back muscles)  Painting:  Paint fumes do not harm your pregnancy, but may make you ill and should be avoided if possible.  Latex or water based paints have less odor than oils.  Use adequate ventilation while painting.  Permanents & Hair Color:  Chemicals in hair dyes are not recommended as they cause increase hair dryness which can increase hair loss during pregnancy.  " Highlighting" and permanents are allowed.  Dye may be absorbed differently and permanents may not hold as well during pregnancy.  Sunbathing:  Use a sunscreen, as skin burns easily during pregnancy.  Drink plenty of fluids; avoid over heating.  Tanning Beds:  Because their possible side effects are still unknown, tanning beds are not recommended.  Ultrasound Scans:  Routine ultrasounds are performed at approximately 20 weeks.  You will be able to see your baby's general anatomy an if you would like to know the gender this can usually be determined as well.  If it is questionable when you conceived you may also receive an ultrasound early in your pregnancy for dating purposes.  Otherwise ultrasound exams are not routinely performed unless there is a medical necessity.  Although you can request a scan we ask that you pay for it when conducted because insurance does not cover " patient request" scans.  Work: If your pregnancy proceeds without complications you may work until your due date, unless your physician or employer advises otherwise.  Round Ligament Pain/Pelvic Discomfort:  Sharp, shooting pains not associated with bleeding are fairly common, usually occurring in the second trimester of pregnancy.  They tend to be worse when standing up or when you remain standing for long periods of time.  These are the result of pressure of certain pelvic ligaments called "round ligaments".  Rest, Tylenol and heat seem to be the most effective relief.  As the womb and fetus grow, they rise out of the  pelvis and the discomfort improves.  Please notify the office if your pain seems different than that described.  It may represent a more serious condition.  Minor Illnesses and Medications in Pregnancy  Cold/Flu:  Sudafed for congestion- Robitussin (plain) for cough- Tylenol for discomfort.  Please follow the directions on the label.  Try not to take any more than needed.  OTC Saline nasal spray and air humidifier or cool-mist  Vaporizer to sooth nasal irritation and to loosen congestion.  It is also important to increase intake of non carbonated fluids, especially if you have a fever.  Constipation:  Colace-2 capsules at bedtime; Metamucil- follow directions on label; Senokot- 1 tablet at bedtime.  Any one of these medications can be used.  It is also very important to increase fluids and fruits along with regular exercise.  If problem persists please call the office.  Diarrhea:  Kaopectate as directed on the label.  Eat a bland diet and increase fluids.  Avoid highly seasoned foods.  Headache:  Tylenol 1 or 2 tablets every 3-4 hours as needed  Indigestion:  Maalox, Mylanta, Tums or Rolaids- as directed on label.  Also try to eat small meals and avoid fatty, greasy or spicy foods.  Nausea with or without Vomiting:  Nausea in pregnancy is caused by increased levels of hormones in the body which influence the digestive system and cause irritation when stomach acids accumulate.  Symptoms usually subside after 1st trimester of pregnancy.  Try the following: 1. Keep saltines, graham crackers or dry toast by your bed to eat upon awakening. 2. Don't let your stomach get empty.  Try to eat 5-6 small meals per day instead of 3 large ones. 3. Avoid greasy fatty or highly seasoned foods.  4. Take OTC Unisom 1 tablet at bed time along with OTC Vitamin B6 25-50 mg 3 times per day.    If nausea continues with vomiting and you are unable to keep down food and fluids you may need a prescription medication.   Please notify your provider.   Sore throat:  Chloraseptic spray, throat lozenges and or plain Tylenol.  Vaginal Yeast Infection:  OTC Monistat for 7 days as directed on label.  If symptoms do not resolve within a week notify provider.  If any of the above problems do not subside with recommended treatment please call the office for further assistance.   Do not take Aspirin, Advil, Motrin or Ibuprofen.  * * OTC= Over the counter

## 2015-02-09 ENCOUNTER — Ambulatory Visit (INDEPENDENT_AMBULATORY_CARE_PROVIDER_SITE_OTHER): Payer: BLUE CROSS/BLUE SHIELD | Admitting: Family Medicine

## 2015-02-09 ENCOUNTER — Encounter: Payer: Self-pay | Admitting: Family Medicine

## 2015-02-09 VITALS — BP 98/60 | HR 88 | Ht 66.5 in | Wt 182.2 lb

## 2015-02-09 DIAGNOSIS — R109 Unspecified abdominal pain: Secondary | ICD-10-CM

## 2015-02-09 DIAGNOSIS — O26899 Other specified pregnancy related conditions, unspecified trimester: Secondary | ICD-10-CM

## 2015-02-09 LAB — POCT URINALYSIS DIPSTICK
Bilirubin, UA: NEGATIVE
Blood, UA: NEGATIVE
Glucose, UA: NEGATIVE
Ketones, UA: NEGATIVE
LEUKOCYTES UA: NEGATIVE
Nitrite, UA: NEGATIVE
PROTEIN UA: NEGATIVE
SPEC GRAV UA: 1.015
Urobilinogen, UA: NEGATIVE
pH, UA: 7

## 2015-02-09 NOTE — Patient Instructions (Signed)
  Constipation:  Be sure to drink plenty of water, eat a high fiber diet. You may take Colace (stool softener) daily--this is safe in pregnancy.  Based on your exam and the fact that your pain is only intermittent and mild, I do not suspect that you have an ectopic pregnancy or are miscarrying at this time--but this is the concern if have ongoing or worsening pain, especially with any bleeding.  If your pain persists, worsens, if you have bleeding, call your OB.  Continue use heat if/when needed, as well as Tylenol, if needed.

## 2015-02-09 NOTE — Progress Notes (Signed)
Chief Complaint  Patient presents with  . Abdominal Cramping    that began after intercourse this past Sunday.    3 nights ago she had intercourse. The next morning she noticed cramping in the lower abdomen. It seems to come and go.  Had no pain when on a walk this morning.  Denies any bleeding, spotting, or vaginal discharge (just clear).  Symptoms have come and gone.  Discomfort is less than period cramps, mostly dull.  She hasn't needed to take any medication for the pain.  Heating pad has helped.  Denies dysuria.  She has some frequency, but doesn't feel like she has a UTI (has had in the past).  She has appointment with OB on 11/9 with Dr. Tenny Crawoss  She has been complaining of constipation over the last week.  She has been on PNV for quite a few months, not recently started.  PMH, PSH, SH reviewed.  Outpatient Encounter Prescriptions as of 02/09/2015  Medication Sig Note  . Prenatal Vit-Fe Fumarate-FA (PRENATAL VITAMIN PO) Take 1 tablet by mouth daily.   Marland Kitchen. albuterol (PROAIR HFA) 108 (90 BASE) MCG/ACT inhaler Inhale 1-2 puffs into the lungs every 4 (four) hours as needed. (Patient not taking: Reported on 02/02/2015)   . fluticasone (FLONASE) 50 MCG/ACT nasal spray Place 2 sprays into both nostrils daily. (Patient not taking: Reported on 02/09/2015)   . montelukast (SINGULAIR) 10 MG tablet Take 1 tablet (10 mg total) by mouth at bedtime. (Patient not taking: Reported on 02/02/2015) 02/02/2015: Hasn't taken since May 2016   No facility-administered encounter medications on file as of 02/09/2015.   No Known Allergies  ROS: no fever, chills, nausea, vomiting, dysuria, abnormal vaginal discharge, bleeding, bruising, or other complaints except as noted above. + constipation.  PHYSICAL EXAM: BP 98/60 mmHg  Pulse 88  Ht 5' 6.5" (1.689 m)  Wt 182 lb 3.2 oz (82.645 kg)  BMI 28.97 kg/m2  LMP 12/26/2014   Well developed, pleasant female in no distress Abdomen: soft, nontender, no  mass.  Urine dip--normal.  ASSESSMENT/PLAN:  Abdominal pain during pregnancy  Abdominal cramping - Plan: POCT Urinalysis Dipstick   [facility]  Patient reassured.  Reviewed s/sx of miscarriage, ectopic.  Return [facility] if worsening symptoms--will need further eval with quant hcg, possibly u/s.  Exam and history today was very reassuring.    Constipation:  Be sure to drink plenty of water, eat a high fiber diet. You may take Colace (stool softener) daily--this is safe in pregnancy.  Based on your exam and the fact that your pain is only intermittent and mild, I do not suspect that you have an ectopic pregnancy or are miscarrying at this time--but this is the concern if have ongoing or worsening pain, especially with any bleeding.  If your pain persists, worsens, if you have bleeding, call your OB.  Continue use heat if/when needed, as well as Tylenol, if needed.

## 2015-02-23 ENCOUNTER — Other Ambulatory Visit: Payer: Self-pay | Admitting: Obstetrics and Gynecology

## 2015-02-23 LAB — OB RESULTS CONSOLE HEPATITIS B SURFACE ANTIGEN: Hepatitis B Surface Ag: NEGATIVE

## 2015-02-23 LAB — OB RESULTS CONSOLE RUBELLA ANTIBODY, IGM: Rubella: IMMUNE

## 2015-02-23 LAB — OB RESULTS CONSOLE RPR: RPR: NONREACTIVE

## 2015-02-23 LAB — OB RESULTS CONSOLE ANTIBODY SCREEN: ANTIBODY SCREEN: NEGATIVE

## 2015-02-23 LAB — OB RESULTS CONSOLE GC/CHLAMYDIA
Chlamydia: NEGATIVE
Gonorrhea: NEGATIVE

## 2015-02-23 LAB — OB RESULTS CONSOLE ABO/RH: RH Type: POSITIVE

## 2015-02-23 LAB — OB RESULTS CONSOLE HIV ANTIBODY (ROUTINE TESTING): HIV: NONREACTIVE

## 2015-02-23 LAB — OB RESULTS CONSOLE PLATELET COUNT: Platelets: 231 10*3/uL

## 2015-02-23 LAB — OB RESULTS CONSOLE HGB/HCT, BLOOD: HEMOGLOBIN: 12 g/dL

## 2015-04-17 NOTE — L&D Delivery Note (Signed)
Delivery Note  Patient presented in spontaneous labor. For protracted latent phase arom was performed and pitocin was given.  At 2:37 AM a viable female was delivered via Vaginal, Spontaneous Delivery (Presentation: ; Occiput Anterior).  APGAR: 9, 9; weight pending.   Placenta status: Intact, Spontaneous.  Cord: 3 vessels with the following complications: None.  Cord pH: not obtained  Anesthesia: Epidural  Episiotomy: None Lacerations: 2nd degree;Perineal Suture Repair: 3.0 vicryl Est. Blood Loss (mL):  300  Mom to postpartum.  Baby to Couplet care / Skin to Skin.  Elizabeth Williamson 10/12/2015, 3:41 AM

## 2015-04-25 ENCOUNTER — Encounter: Payer: Self-pay | Admitting: Family Medicine

## 2015-04-25 ENCOUNTER — Telehealth: Payer: Self-pay | Admitting: Family Medicine

## 2015-04-25 NOTE — Telephone Encounter (Signed)
Pt called requesting a note be sent to her insurance company stating that she is pregnant sent faxed to [phone]

## 2015-05-06 ENCOUNTER — Encounter: Payer: Self-pay | Admitting: Family Medicine

## 2015-05-06 ENCOUNTER — Encounter: Payer: Self-pay | Admitting: *Deleted

## 2015-05-06 ENCOUNTER — Ambulatory Visit (INDEPENDENT_AMBULATORY_CARE_PROVIDER_SITE_OTHER): Payer: Self-pay | Admitting: Family Medicine

## 2015-05-06 VITALS — BP 109/72 | HR 93 | Wt 184.0 lb

## 2015-05-06 DIAGNOSIS — Z3400 Encounter for supervision of normal first pregnancy, unspecified trimester: Secondary | ICD-10-CM | POA: Insufficient documentation

## 2015-05-06 DIAGNOSIS — Z3402 Encounter for supervision of normal first pregnancy, second trimester: Secondary | ICD-10-CM

## 2015-05-06 DIAGNOSIS — J301 Allergic rhinitis due to pollen: Secondary | ICD-10-CM

## 2015-05-06 DIAGNOSIS — J4520 Mild intermittent asthma, uncomplicated: Secondary | ICD-10-CM | POA: Insufficient documentation

## 2015-05-06 DIAGNOSIS — J309 Allergic rhinitis, unspecified: Secondary | ICD-10-CM | POA: Insufficient documentation

## 2015-05-06 NOTE — Patient Instructions (Signed)
Second Trimester of Pregnancy The second trimester is from week 13 through week 28, months 4 through 6. The second trimester is often a time when you feel your best. Your body has also adjusted to being pregnant, and you begin to feel better physically. Usually, morning sickness has lessened or quit completely, you may have more energy, and you may have an increase in appetite. The second trimester is also a time when the fetus is growing rapidly. At the end of the sixth month, the fetus is about 9 inches long and weighs about 1 pounds. You will likely begin to feel the baby move (quickening) between 18 and 20 weeks of the pregnancy. BODY CHANGES Your body goes through many changes during pregnancy. The changes vary from woman to woman.   Your weight will continue to increase. You will notice your lower abdomen bulging out.  You may begin to get stretch marks on your hips, abdomen, and breasts.  You may develop headaches that can be relieved by medicines approved by your health care provider.  You may urinate more often because the fetus is pressing on your bladder.  You may develop or continue to have heartburn as a result of your pregnancy.  You may develop constipation because certain hormones are causing the muscles that push waste through your intestines to slow down.  You may develop hemorrhoids or swollen, bulging veins (varicose veins).  You may have back pain because of the weight gain and pregnancy hormones relaxing your joints between the bones in your pelvis and as a result of a shift in weight and the muscles that support your balance.  Your breasts will continue to grow and be tender.  Your gums may bleed and may be sensitive to brushing and flossing.  Dark spots or blotches (chloasma, mask of pregnancy) may develop on your face. This will likely fade after the baby is born.  A dark line from your belly button to the pubic area (linea nigra) may appear. This will likely  fade after the baby is born.  You may have changes in your hair. These can include thickening of your hair, rapid growth, and changes in texture. Some women also have hair loss during or after pregnancy, or hair that feels dry or thin. Your hair will most likely return to normal after your baby is born. WHAT TO EXPECT AT YOUR PRENATAL VISITS During a routine prenatal visit:  You will be weighed to make sure you and the fetus are growing normally.  Your blood pressure will be taken.  Your abdomen will be measured to track your baby's growth.  The fetal heartbeat will be listened to.  Any test results from the previous visit will be discussed. Your health care provider may ask you:  How you are feeling.  If you are feeling the baby move.  If you have had any abnormal symptoms, such as leaking fluid, bleeding, severe headaches, or abdominal cramping.  If you are using any tobacco products, including cigarettes, chewing tobacco, and electronic cigarettes.  If you have any questions. Other tests that may be performed during your second trimester include:  Blood tests that check for:  Low iron levels (anemia).  Gestational diabetes (between 24 and 28 weeks).  Rh antibodies.  Urine tests to check for infections, diabetes, or protein in the urine.  An ultrasound to confirm the proper growth and development of the baby.  An amniocentesis to check for possible genetic problems.  Fetal screens for spina bifida   and Down syndrome.  HIV (human immunodeficiency virus) testing. Routine prenatal testing includes screening for HIV, unless you choose not to have this test. HOME CARE INSTRUCTIONS   Avoid all smoking, herbs, alcohol, and unprescribed drugs. These chemicals affect the formation and growth of the baby.  Do not use any tobacco products, including cigarettes, chewing tobacco, and electronic cigarettes. If you need help quitting, ask your health care provider. You may receive  counseling support and other resources to help you quit.  Follow your health care provider's instructions regarding medicine use. There are medicines that are either safe or unsafe to take during pregnancy.  Exercise only as directed by your health care provider. Experiencing uterine cramps is a good sign to stop exercising.  Continue to eat regular, healthy meals.  Wear a good support bra for breast tenderness.  Do not use hot tubs, steam rooms, or saunas.  Wear your seat belt at all times when driving.  Avoid raw meat, uncooked cheese, cat litter boxes, and soil used by cats. These carry germs that can cause birth defects in the baby.  Take your prenatal vitamins.  Take 1500-2000 mg of calcium daily starting at the 20th week of pregnancy until you deliver your baby.  Try taking a stool softener (if your health care provider approves) if you develop constipation. Eat more high-fiber foods, such as fresh vegetables or fruit and whole grains. Drink plenty of fluids to keep your urine clear or pale yellow.  Take warm sitz baths to soothe any pain or discomfort caused by hemorrhoids. Use hemorrhoid cream if your health care provider approves.  If you develop varicose veins, wear support hose. Elevate your feet for 15 minutes, 3-4 times a day. Limit salt in your diet.  Avoid heavy lifting, wear low heel shoes, and practice good posture.  Rest with your legs elevated if you have leg cramps or low back pain.  Visit your dentist if you have not gone yet during your pregnancy. Use a soft toothbrush to brush your teeth and be gentle when you floss.  A sexual relationship may be continued unless your health care provider directs you otherwise.  Continue to go to all your prenatal visits as directed by your health care provider. SEEK MEDICAL CARE IF:   You have dizziness.  You have mild pelvic cramps, pelvic pressure, or nagging pain in the abdominal area.  You have persistent nausea,  vomiting, or diarrhea.  You have a bad smelling vaginal discharge.  You have pain with urination. SEEK IMMEDIATE MEDICAL CARE IF:   You have a fever.  You are leaking fluid from your vagina.  You have spotting or bleeding from your vagina.  You have severe abdominal cramping or pain.  You have rapid weight gain or loss.  You have shortness of breath with chest pain.  You notice sudden or extreme swelling of your face, hands, ankles, feet, or legs.  You have not felt your baby move in over an hour.  You have severe headaches that do not go away with medicine.  You have vision changes.   This information is not intended to replace advice given to you by your health care provider. Make sure you discuss any questions you have with your health care provider.   Document Released: 03/27/2001 Document Revised: 04/23/2014 Document Reviewed: 06/03/2012 Elsevier Interactive Patient Education 2016 [employer]   Breastfeeding Deciding to breastfeed is one of the best choices you can make for you and your baby. A change   in hormones during pregnancy causes your breast tissue to grow and increases the number and size of your milk ducts. These hormones also allow proteins, sugars, and fats from your blood supply to make breast milk in your milk-producing glands. Hormones prevent breast milk from being released before your baby is born as well as prompt milk flow after birth. Once breastfeeding has begun, thoughts of your baby, as well as his or her sucking or crying, can stimulate the release of milk from your milk-producing glands.  BENEFITS OF BREASTFEEDING For Your Baby  Your first milk (colostrum) helps your baby's digestive system function better.  There are antibodies in your milk that help your baby fight off infections.  Your baby has a lower incidence of asthma, allergies, and sudden infant death syndrome.  The nutrients in breast milk are better for your baby than infant  formulas and are designed uniquely for your baby's needs.  Breast milk improves your baby's brain development.  Your baby is less likely to develop other conditions, such as childhood obesity, asthma, or type 2 diabetes mellitus. For You  Breastfeeding helps to create a very special bond between you and your baby.  Breastfeeding is convenient. Breast milk is always available at the correct temperature and costs nothing.  Breastfeeding helps to burn calories and helps you lose the weight gained during pregnancy.  Breastfeeding makes your uterus contract to its prepregnancy size faster and slows bleeding (lochia) after you give birth.   Breastfeeding helps to lower your risk of developing type 2 diabetes mellitus, osteoporosis, and breast or ovarian cancer later in life. SIGNS THAT YOUR BABY IS HUNGRY Early Signs of Hunger  Increased alertness or activity.  Stretching.  Movement of the head from side to side.  Movement of the head and opening of the mouth when the corner of the mouth or cheek is stroked (rooting).  Increased sucking sounds, smacking lips, cooing, sighing, or squeaking.  Hand-to-mouth movements.  Increased sucking of fingers or hands. Late Signs of Hunger  Fussing.  Intermittent crying. Extreme Signs of Hunger Signs of extreme hunger will require calming and consoling before your baby will be able to breastfeed successfully. Do not wait for the following signs of extreme hunger to occur before you initiate breastfeeding:  Restlessness.  A loud, strong cry.  Screaming. BREASTFEEDING BASICS Breastfeeding Initiation  Find a comfortable place to sit or lie down, with your neck and back well supported.  Place a pillow or rolled up blanket under your baby to bring him or her to the level of your breast (if you are seated). Nursing pillows are specially designed to help support your arms and your baby while you breastfeed.  Make sure that your baby's  abdomen is facing your abdomen.  Gently massage your breast. With your fingertips, massage from your chest wall toward your nipple in a circular motion. This encourages milk flow. You may need to continue this action during the feeding if your milk flows slowly.  Support your breast with 4 fingers underneath and your thumb above your nipple. Make sure your fingers are well away from your nipple and your baby's mouth.  Stroke your baby's lips gently with your finger or nipple.  When your baby's mouth is open wide enough, quickly bring your baby to your breast, placing your entire nipple and as much of the colored area around your nipple (areola) as possible into your baby's mouth.  More areola should be visible above your baby's upper lip than   below the lower lip.  Your baby's tongue should be between his or her lower gum and your breast.  Ensure that your baby's mouth is correctly positioned around your nipple (latched). Your baby's lips should create a seal on your breast and be turned out (everted).  It is common for your baby to suck about 2-3 minutes in order to start the flow of breast milk. Latching Teaching your baby how to latch on to your breast properly is very important. An improper latch can cause nipple pain and decreased milk supply for you and poor weight gain in your baby. Also, if your baby is not latched onto your nipple properly, he or she may swallow some air during feeding. This can make your baby fussy. Burping your baby when you switch breasts during the feeding can help to get rid of the air. However, teaching your baby to latch on properly is still the best way to prevent fussiness from swallowing air while breastfeeding. Signs that your baby has successfully latched on to your nipple:  Silent tugging or silent sucking, without causing you pain.  Swallowing heard between every 3-4 sucks.  Muscle movement above and in front of his or her ears while sucking. Signs  that your baby has not successfully latched on to nipple:  Sucking sounds or smacking sounds from your baby while breastfeeding.  Nipple pain. If you think your baby has not latched on correctly, slip your finger into the corner of your baby's mouth to break the suction and place it between your baby's gums. Attempt breastfeeding initiation again. Signs of Successful Breastfeeding Signs from your baby:  A gradual decrease in the number of sucks or complete cessation of sucking.  Falling asleep.  Relaxation of his or her body.  Retention of a small amount of milk in his or her mouth.  Letting go of your breast by himself or herself. Signs from you:  Breasts that have increased in firmness, weight, and size 1-3 hours after feeding.  Breasts that are softer immediately after breastfeeding.  Increased milk volume, as well as a change in milk consistency and color by the fifth day of breastfeeding.  Nipples that are not sore, cracked, or bleeding. Signs That Your Baby is Getting Enough Milk  Wetting at least 3 diapers in a 24-hour period. The urine should be clear and pale yellow by age 5 days.  At least 3 stools in a 24-hour period by age 5 days. The stool should be soft and yellow.  At least 3 stools in a 24-hour period by age 7 days. The stool should be seedy and yellow.  No loss of weight greater than 10% of birth weight during the first 3 days of age.  Average weight gain of 4-7 ounces (113-198 g) per week after age 4 days.  Consistent daily weight gain by age 5 days, without weight loss after the age of 2 weeks. After a feeding, your baby may spit up a small amount. This is common. BREASTFEEDING FREQUENCY AND DURATION Frequent feeding will help you make more milk and can prevent sore nipples and breast engorgement. Breastfeed when you feel the need to reduce the fullness of your breasts or when your baby shows signs of hunger. This is called "breastfeeding on demand." Avoid  introducing a pacifier to your baby while you are working to establish breastfeeding (the first 4-6 weeks after your baby is born). After this time you may choose to use a pacifier. Research has shown that   pacifier use during the first year of a baby's life decreases the risk of sudden infant death syndrome (SIDS). Allow your baby to feed on each breast as long as he or she wants. Breastfeed until your baby is finished feeding. When your baby unlatches or falls asleep while feeding from the first breast, offer the second breast. Because newborns are often sleepy in the first few weeks of life, you may need to awaken your baby to get him or her to feed. Breastfeeding times will vary from baby to baby. However, the following rules can serve as a guide to help you ensure that your baby is properly fed:  Newborns (babies 4 weeks of age or younger) may breastfeed every 1-3 hours.  Newborns should not go longer than 3 hours during the day or 5 hours during the night without breastfeeding.  You should breastfeed your baby a minimum of 8 times in a 24-hour period until you begin to introduce solid foods to your baby at around 6 months of age. BREAST MILK PUMPING Pumping and storing breast milk allows you to ensure that your baby is exclusively fed your breast milk, even at times when you are unable to breastfeed. This is especially important if you are going back to work while you are still breastfeeding or when you are not able to be present during feedings. Your lactation consultant can give you guidelines on how long it is safe to store breast milk. A breast pump is a machine that allows you to pump milk from your breast into a sterile bottle. The pumped breast milk can then be stored in a refrigerator or freezer. Some breast pumps are operated by hand, while others use electricity. Ask your lactation consultant which type will work best for you. Breast pumps can be purchased, but some hospitals and  breastfeeding support groups lease breast pumps on a monthly basis. A lactation consultant can teach you how to hand express breast milk, if you prefer not to use a pump. CARING FOR YOUR BREASTS WHILE YOU BREASTFEED Nipples can become dry, cracked, and sore while breastfeeding. The following recommendations can help keep your breasts moisturized and healthy:  Avoid using soap on your nipples.  Wear a supportive bra. Although not required, special nursing bras and tank tops are designed to allow access to your breasts for breastfeeding without taking off your entire bra or top. Avoid wearing underwire-style bras or extremely tight bras.  Air dry your nipples for 3-4minutes after each feeding.  Use only cotton bra pads to absorb leaked breast milk. Leaking of breast milk between feedings is normal.  Use lanolin on your nipples after breastfeeding. Lanolin helps to maintain your skin's normal moisture barrier. If you use pure lanolin, you do not need to wash it off before feeding your baby again. Pure lanolin is not toxic to your baby. You may also hand express a few drops of breast milk and gently massage that milk into your nipples and allow the milk to air dry. In the first few weeks after giving birth, some women experience extremely full breasts (engorgement). Engorgement can make your breasts feel heavy, warm, and tender to the touch. Engorgement peaks within 3-5 days after you give birth. The following recommendations can help ease engorgement:  Completely empty your breasts while breastfeeding or pumping. You may want to start by applying warm, moist heat (in the shower or with warm water-soaked hand towels) just before feeding or pumping. This increases circulation and helps the milk   flow. If your baby does not completely empty your breasts while breastfeeding, pump any extra milk after he or she is finished.  Wear a snug bra (nursing or regular) or tank top for 1-2 days to signal your body  to slightly decrease milk production.  Apply ice packs to your breasts, unless this is too uncomfortable for you.  Make sure that your baby is latched on and positioned properly while breastfeeding. If engorgement persists after 48 hours of following these recommendations, contact your health care provider or a lactation consultant. OVERALL HEALTH CARE RECOMMENDATIONS WHILE BREASTFEEDING  Eat healthy foods. Alternate between meals and snacks, eating 3 of each per day. Because what you eat affects your breast milk, some of the foods may make your baby more irritable than usual. Avoid eating these foods if you are sure that they are negatively affecting your baby.  Drink milk, fruit juice, and water to satisfy your thirst (about 10 glasses a day).  Rest often, relax, and continue to take your prenatal vitamins to prevent fatigue, stress, and anemia.  Continue breast self-awareness checks.  Avoid chewing and smoking tobacco. Chemicals from cigarettes that pass into breast milk and exposure to secondhand smoke may harm your baby.  Avoid alcohol and drug use, including marijuana. Some medicines that may be harmful to your baby can pass through breast milk. It is important to ask your health care provider before taking any medicine, including all over-the-counter and prescription medicine as well as vitamin and herbal supplements. It is possible to become pregnant while breastfeeding. If birth control is desired, ask your health care provider about options that will be safe for your baby. SEEK MEDICAL CARE IF:  You feel like you want to stop breastfeeding or have become frustrated with breastfeeding.  You have painful breasts or nipples.  Your nipples are cracked or bleeding.  Your breasts are red, tender, or warm.  You have a swollen area on either breast.  You have a fever or chills.  You have nausea or vomiting.  You have drainage other than breast milk from your nipples.  Your  breasts do not become full before feedings by the fifth day after you give birth.  You feel sad and depressed.  Your baby is too sleepy to eat well.  Your baby is having trouble sleeping.   Your baby is wetting less than 3 diapers in a 24-hour period.  Your baby has less than 3 stools in a 24-hour period.  Your baby's skin or the white part of his or her eyes becomes yellow.   Your baby is not gaining weight by 5 days of age. SEEK IMMEDIATE MEDICAL CARE IF:  Your baby is overly tired (lethargic) and does not want to wake up and feed.  Your baby develops an unexplained fever.   This information is not intended to replace advice given to you by your health care provider. Make sure you discuss any questions you have with your health care provider.   Document Released: 04/02/2005 Document Revised: 12/22/2014 Document Reviewed: 09/24/2012 Elsevier Interactive Patient Education 2016 [employer]  

## 2015-05-06 NOTE — Progress Notes (Signed)
Subjective:  Elizabeth Williamson is a 25 y.o. G3P0020 at [redacted]w[redacted]d being seen today for ongoing prenatal care.  She is currently monitored for the following issues for this low-risk pregnancy and has Gastroesophageal reflux disease without esophagitis; Panic attack; Asthma, mild intermittent; Allergic rhinitis; and Supervision of normal first pregnancy on her problem list.  Patient reports no complaints.  Contractions: Not present. Vag. Bleeding: None.  Movement: Absent. Denies leaking of fluid.   The following portions of the patient's history were reviewed and updated as appropriate: allergies, current medications, past family history, past medical history, past social history, past surgical history and problem list. Problem list updated.  Objective:   Filed Vitals:   05/06/15 1006  BP: 109/72  Pulse: 93  Weight: 184 lb (83.462 kg)    Fetal Status: Fetal Heart Rate (bpm): 157 Fundal Height: 17 cm Movement: Absent     General:  Alert, oriented and cooperative. Patient is in no acute distress.  Skin: Skin is warm and dry. No rash noted.   Cardiovascular: Normal heart rate noted  Respiratory: Normal respiratory effort, no problems with respiration noted  Abdomen: Soft, gravid, appropriate for gestational age. Pain/Pressure: Absent     Pelvic: Vag. Bleeding: None Vag D/C Character: Thin   Cervical exam deferred        Extremities: Normal range of motion.  Edema: None  Mental Status: Normal mood and affect. Normal behavior. Normal judgment and thought content.   Urinalysis: Urine Protein: Negative Urine Glucose: Negative  Assessment and Plan:  Pregnancy: G3P0020 at [redacted]w[redacted]d  1. Encounter for supervision of normal first pregnancy in second trimester Transfer from [facility], labs reviewed--lost her insurance--now [facility] Will need anatomy - US MFM OB COMP + 14 WK; Future  General obstetric precautions including but not limited to vaginal bleeding, contractions, leaking of fluid and  fetal movement were reviewed in detail with the patient. Please refer to After Visit Summary for other counseling recommendations.  Return in 4 weeks (on 06/03/2015).   Reva Bores, MD

## 2015-05-24 ENCOUNTER — Encounter: Payer: Self-pay | Admitting: *Deleted

## 2015-05-30 ENCOUNTER — Ambulatory Visit (INDEPENDENT_AMBULATORY_CARE_PROVIDER_SITE_OTHER): Payer: Self-pay | Admitting: Obstetrics & Gynecology

## 2015-05-30 VITALS — BP 110/73 | HR 97 | Wt 184.0 lb

## 2015-05-30 DIAGNOSIS — Z3482 Encounter for supervision of other normal pregnancy, second trimester: Secondary | ICD-10-CM

## 2015-05-30 DIAGNOSIS — Z3402 Encounter for supervision of normal first pregnancy, second trimester: Secondary | ICD-10-CM

## 2015-05-30 NOTE — Progress Notes (Signed)
Subjective:  Elizabeth Williamson is a 25 y.o. M AA G3P0020 at [redacted]w[redacted]d being seen today for ongoing prenatal care.  She is currently monitored for the following issues for this low-risk pregnancy and has Gastroesophageal reflux disease without esophagitis; Panic attack; Asthma, mild intermittent; Allergic rhinitis; and Supervision of normal first pregnancy on her problem list.  Patient reports no complaints.  Contractions: Not present. Vag. Bleeding: None.  Movement: Present. Denies leaking of fluid.   The following portions of the patient's history were reviewed and updated as appropriate: allergies, current medications, past family history, past medical history, past social history, past surgical history and problem list. Problem list updated.  Objective:   Filed Vitals:   05/30/15 1555  BP: 110/73  Pulse: 97  Weight: 184 lb (83.462 kg)    Fetal Status:     Movement: Present     General:  Alert, oriented and cooperative. Patient is in no acute distress.  Skin: Skin is warm and dry. No rash noted.   Cardiovascular: Normal heart rate noted  Respiratory: Normal respiratory effort, no problems with respiration noted  Abdomen: Soft, gravid, appropriate for gestational age. Pain/Pressure: Absent     Pelvic: Vag. Bleeding: None     Cervical exam deferred        Extremities: Normal range of motion.  Edema: None  Mental Status: Normal mood and affect. Normal behavior. Normal judgment and thought content.   Urinalysis:      Assessment and Plan:  Pregnancy: G3P0020 at [redacted]w[redacted]d  1. Encounter for supervision of normal first pregnancy in second trimester   Preterm labor symptoms and general obstetric precautions including but not limited to vaginal bleeding, contractions, leaking of fluid and fetal movement were reviewed in detail with the patient. Please refer to After Visit Summary for other counseling recommendations.  No Follow-up on file.   Allie Bossier, MD

## 2015-06-27 ENCOUNTER — Encounter: Payer: Self-pay | Admitting: Family Medicine

## 2015-07-01 ENCOUNTER — Ambulatory Visit (INDEPENDENT_AMBULATORY_CARE_PROVIDER_SITE_OTHER): Payer: 59 | Admitting: Obstetrics & Gynecology

## 2015-07-01 VITALS — BP 103/66 | HR 93 | Wt 187.0 lb

## 2015-07-01 DIAGNOSIS — Z3402 Encounter for supervision of normal first pregnancy, second trimester: Secondary | ICD-10-CM

## 2015-07-01 NOTE — Patient Instructions (Addendum)
Thank you for enrolling in MyChart. Please follow the instructions below to securely access your online medical record. MyChart allows you to send messages to your doctor, view your test results, manage appointments, and more.   How Do I Sign Up? 1. In your Internet browser, go to [employer] and enter [email or web address]. 2. Click on the Sign Up Now link in the Sign In box. You will see the New Member Sign Up page. 3. Enter your MyChart Access Code exactly as it appears below. You will not need to use this code after you've completed the sign-up process. If you do not sign up before the expiration date, you must request a new code.  MyChart Access Code: [email or web address] Expires: 08/30/2015  9:02 AM  4. Enter your Social Security Number ([email or web address]) and Date of Birth (mm/dd/yyyy) as indicated and click Submit. You will be taken to the next sign-up page. 5. Create a MyChart ID. This will be your MyChart login ID and cannot be changed, so think of one that is secure and easy to remember. 6. Create a MyChart password. You can change your password at any time. 7. Enter your Password Reset Question and Answer. This can be used at a later time if you forget your password.  8. Enter your e-mail address. You will receive e-mail notification when new information is available in MyChart. 9. Click Sign Up. You can now view your medical record.   Additional Information Remember, MyChart is NOT to be used for urgent needs. For medical emergencies, dial 911.     Contraception Choices Contraception (birth control) is the use of any methods or devices to prevent pregnancy. Below are some methods to help avoid pregnancy. HORMONAL METHODS   Contraceptive implant. This is a thin, plastic tube containing progesterone hormone. It does not contain estrogen hormone. Your health care provider inserts the tube in the inner part of the upper arm. The tube can remain in place for up to 3 years. After  3 years, the implant must be removed. The implant prevents the ovaries from releasing an egg (ovulation), thickens the cervical mucus to prevent sperm from entering the uterus, and thins the lining of the inside of the uterus.  Progesterone-only injections. These injections are given every 3 months by your health care provider to prevent pregnancy. This synthetic progesterone hormone stops the ovaries from releasing eggs. It also thickens cervical mucus and changes the uterine lining. This makes it harder for sperm to survive in the uterus.  Birth control pills. These pills contain estrogen and progesterone hormone. They work by preventing the ovaries from releasing eggs (ovulation). They also cause the cervical mucus to thicken, preventing the sperm from entering the uterus. Birth control pills are prescribed by a health care provider.Birth control pills can also be used to treat heavy periods.  Minipill. This type of birth control pill contains only the progesterone hormone. They are taken every day of each month and must be prescribed by your health care provider.  Birth control patch. The patch contains hormones similar to those in birth control pills. It must be changed once a week and is prescribed by a health care provider.  Vaginal ring. The ring contains hormones similar to those in birth control pills. It is left in the vagina for 3 weeks, removed for 1 week, and then a new one is put back in place. The patient must be comfortable inserting and removing the ring from the vagina.A health care provider's prescription is necessary.  Emergency contraception. Emergency contraceptives prevent pregnancy after unprotected sexual intercourse. This pill can be taken right after sex or up to 5 days after unprotected sex. It is most effective the sooner you take the pills after having sexual intercourse. Most emergency contraceptive pills are available without a prescription. Check with your pharmacist.  Do not use emergency contraception as your only form of birth control. BARRIER METHODS   Female condom. This is a thin sheath (latex or rubber) that is worn over the penis during sexual intercourse. It can be used with spermicide to increase effectiveness.  Female condom. This is a soft, loose-fitting sheath that is put into the vagina before sexual intercourse.  Diaphragm. This is a soft, latex, dome-shaped barrier that must be fitted by a health care provider. It is inserted into the vagina, along with a spermicidal jelly. It is inserted before intercourse. The diaphragm should be left in the vagina for 6 to 8 hours after intercourse.  Cervical cap. This is a round, soft, latex or plastic cup that fits over the cervix and must be fitted by a health care provider. The cap can be left in place for up to 48 hours after intercourse.  Sponge. This is a soft, circular piece of polyurethane foam. The sponge has spermicide in it. It is inserted into the vagina after wetting it and before sexual intercourse.  Spermicides. These are chemicals that kill or block sperm from entering the cervix and uterus. They come in the form of creams, jellies, suppositories, foam, or tablets. They do not require a prescription. They are inserted into the vagina with an applicator before having sexual intercourse. The process must be repeated every time you have sexual intercourse. INTRAUTERINE CONTRACEPTION  Intrauterine device (IUD). This is a T-shaped device that is put in a woman's uterus during a menstrual period to prevent pregnancy. There are 2 types:  Copper IUD. This type of IUD is wrapped in copper wire and is placed inside the uterus. Copper makes the uterus and fallopian tubes produce a fluid that kills sperm. It can stay in place for 10 years.  Hormone IUD. This type of IUD contains the hormone progestin (synthetic progesterone). The hormone thickens the cervical mucus and prevents sperm from entering the  uterus, and it also thins the uterine lining to prevent implantation of a fertilized egg. The hormone can weaken or kill the sperm that get into the uterus. It can stay in place for 3-5 years, depending on which type of IUD is used. PERMANENT METHODS OF CONTRACEPTION  Female tubal ligation. This is when the woman's fallopian tubes are surgically sealed, tied, or blocked to prevent the egg from traveling to the uterus.  Hysteroscopic sterilization. This involves placing a small coil or insert into each fallopian tube. Your doctor uses a technique called hysteroscopy to do the procedure. The device causes scar tissue to form. This results in permanent blockage of the fallopian tubes, so the sperm cannot fertilize the egg. It takes about 3 months after the procedure for the tubes to become blocked. You must use another form of birth control for these 3 months.  Female sterilization. This is when the female has the tubes that carry sperm tied off (vasectomy).This blocks sperm from entering the vagina during sexual intercourse. After the procedure, the man can still ejaculate fluid (semen). NATURAL PLANNING METHODS  Natural family planning. This is not having sexual intercourse or using a barrier method (condom, diaphragm, cervical cap) on days the woman could become  pregnant.  Calendar method. This is keeping track of the length of each menstrual cycle and identifying when you are fertile.  Ovulation method. This is avoiding sexual intercourse during ovulation.  Symptothermal method. This is avoiding sexual intercourse during ovulation, using a thermometer and ovulation symptoms.  Post-ovulation method. This is timing sexual intercourse after you have ovulated. Regardless of which type or method of contraception you choose, it is important that you use condoms to protect against the transmission of sexually transmitted infections (STIs). Talk with your health care provider about which form of  contraception is most appropriate for you.   This information is not intended to replace advice given to you by your health care provider. Make sure you discuss any questions you have with your health care provider.   Document Released: 04/02/2005 Document Revised: 04/07/2013 Document Reviewed: 09/25/2012 Elsevier Interactive Patient Education [employer] Third Trimester of Pregnancy The third trimester is from week 29 through week 42, months 7 through 9. The third trimester is a time when the fetus is growing rapidly. At the end of the ninth month, the fetus is about 20 inches in length and weighs 6-10 pounds.  BODY CHANGES Your body goes through many changes during pregnancy. The changes vary from woman to woman.   Your weight will continue to increase. You can expect to gain 25-35 pounds (11-16 kg) by the end of the pregnancy.  You may begin to get stretch marks on your hips, abdomen, and breasts.  You may urinate more often because the fetus is moving lower into your pelvis and pressing on your bladder.  You may develop or continue to have heartburn as a result of your pregnancy.  You may develop constipation because certain hormones are causing the muscles that push waste through your intestines to slow down.  You may develop hemorrhoids or swollen, bulging veins (varicose veins).  You may have pelvic pain because of the weight gain and pregnancy hormones relaxing your joints between the bones in your pelvis. Backaches may result from overexertion of the muscles supporting your posture.  You may have changes in your hair. These can include thickening of your hair, rapid growth, and changes in texture. Some women also have hair loss during or after pregnancy, or hair that feels dry or thin. Your hair will most likely return to normal after your baby is born.  Your breasts will continue to grow and be tender. A yellow discharge may leak from your breasts called  colostrum.  Your belly button may stick out.  You may feel short of breath because of your expanding uterus.  You may notice the fetus "dropping," or moving lower in your abdomen.  You may have a bloody mucus discharge. This usually occurs a few days to a week before labor begins.  Your cervix becomes thin and soft (effaced) near your due date. WHAT TO EXPECT AT YOUR PRENATAL EXAMS  You will have prenatal exams every 2 weeks until week 36. Then, you will have weekly prenatal exams. During a routine prenatal visit:  You will be weighed to make sure you and the fetus are growing normally.  Your blood pressure is taken.  Your abdomen will be measured to track your baby's growth.  The fetal heartbeat will be listened to.  Any test results from the previous visit will be discussed.  You may have a cervical check near your due date to see if you have effaced. At around 36 weeks, your caregiver will check  your cervix. At the same time, your caregiver will also perform a test on the secretions of the vaginal tissue. This test is to determine if a type of bacteria, Group B streptococcus, is present. Your caregiver will explain this further. Your caregiver may ask you:  What your birth plan is.  How you are feeling.  If you are feeling the baby move.  If you have had any abnormal symptoms, such as leaking fluid, bleeding, severe headaches, or abdominal cramping.  If you are using any tobacco products, including cigarettes, chewing tobacco, and electronic cigarettes.  If you have any questions. Other tests or screenings that may be performed during your third trimester include:  Blood tests that check for low iron levels (anemia).  Fetal testing to check the health, activity level, and growth of the fetus. Testing is done if you have certain medical conditions or if there are problems during the pregnancy.  HIV (human immunodeficiency virus) testing. If you are at high risk, you may  be screened for HIV during your third trimester of pregnancy. FALSE LABOR You may feel small, irregular contractions that eventually go away. These are called Braxton Hicks contractions, or false labor. Contractions may last for hours, days, or even weeks before true labor sets in. If contractions come at regular intervals, intensify, or become painful, it is best to be seen by your caregiver.  SIGNS OF LABOR   Menstrual-like cramps.  Contractions that are 5 minutes apart or less.  Contractions that start on the top of the uterus and spread down to the lower abdomen and back.  A sense of increased pelvic pressure or back pain.  A watery or bloody mucus discharge that comes from the vagina. If you have any of these signs before the 37th week of pregnancy, call your caregiver right away. You need to go to the hospital to get checked immediately. HOME CARE INSTRUCTIONS   Avoid all smoking, herbs, alcohol, and unprescribed drugs. These chemicals affect the formation and growth of the baby.  Do not use any tobacco products, including cigarettes, chewing tobacco, and electronic cigarettes. If you need help quitting, ask your health care provider. You may receive counseling support and other resources to help you quit.  Follow your caregiver's instructions regarding medicine use. There are medicines that are either safe or unsafe to take during pregnancy.  Exercise only as directed by your caregiver. Experiencing uterine cramps is a good sign to stop exercising.  Continue to eat regular, healthy meals.  Wear a good support bra for breast tenderness.  Do not use hot tubs, steam rooms, or saunas.  Wear your seat belt at all times when driving.  Avoid raw meat, uncooked cheese, cat litter boxes, and soil used by cats. These carry germs that can cause birth defects in the baby.  Take your prenatal vitamins.  Take 1500-2000 mg of calcium daily starting at the 20th week of pregnancy until you  deliver your baby.  Try taking a stool softener (if your caregiver approves) if you develop constipation. Eat more high-fiber foods, such as fresh vegetables or fruit and whole grains. Drink plenty of fluids to keep your urine clear or pale yellow.  Take warm sitz baths to soothe any pain or discomfort caused by hemorrhoids. Use hemorrhoid cream if your caregiver approves.  If you develop varicose veins, wear support hose. Elevate your feet for 15 minutes, 3-4 times a day. Limit salt in your diet.  Avoid heavy lifting, wear low heal shoes,  and practice good posture.  Rest a lot with your legs elevated if you have leg cramps or low back pain.  Visit your dentist if you have not gone during your pregnancy. Use a soft toothbrush to brush your teeth and be gentle when you floss.  A sexual relationship may be continued unless your caregiver directs you otherwise.  Do not travel far distances unless it is absolutely necessary and only with the approval of your caregiver.  Take prenatal classes to understand, practice, and ask questions about the labor and delivery.  Make a trial run to the hospital.  Pack your hospital bag.  Prepare the baby's nursery.  Continue to go to all your prenatal visits as directed by your caregiver. SEEK MEDICAL CARE IF:  You are unsure if you are in labor or if your water has broken.  You have dizziness.  You have mild pelvic cramps, pelvic pressure, or nagging pain in your abdominal area.  You have persistent nausea, vomiting, or diarrhea.  You have a bad smelling vaginal discharge.  You have pain with urination. SEEK IMMEDIATE MEDICAL CARE IF:   You have a fever.  You are leaking fluid from your vagina.  You have spotting or bleeding from your vagina.  You have severe abdominal cramping or pain.  You have rapid weight loss or gain.  You have shortness of breath with chest pain.  You notice sudden or extreme swelling of your face, hands,  ankles, feet, or legs.  You have not felt your baby move in over an hour.  You have severe headaches that do not go away with medicine.  You have vision changes.   This information is not intended to replace advice given to you by your health care provider. Make sure you discuss any questions you have with your health care provider. Document Released: 03/27/2001 Document Revised: 04/23/2014 Document Reviewed: 06/03/2012 Elsevier Interactive Patient Education [employer]

## 2015-07-01 NOTE — Progress Notes (Signed)
Subjective:  Elizabeth Williamson is a 25 y.o. G3P0020 at 1853w2d being seen today for ongoing prenatal care.  She is currently monitored for the following issues for this low-risk pregnancy and has Gastroesophageal reflux disease without esophagitis; Panic attack; Asthma, mild intermittent; Allergic rhinitis; and Supervision of normal first pregnancy on her problem list.  Patient reports no complaints.  Contractions: Not present. Vag. Bleeding: None.  Movement: Present. Denies leaking of fluid.   The following portions of the patient's history were reviewed and updated as appropriate: allergies, current medications, past family history, past medical history, past social history, past surgical history and problem list. Problem list updated.  Objective:   Filed Vitals:   07/01/15 1004  BP: 103/66  Pulse: 93  Weight: 187 lb (84.823 kg)    Fetal Status: Fetal Heart Rate (bpm): 154 Fundal Height: 25 cm Movement: Present     General:  Alert, oriented and cooperative. Patient is in no acute distress.  Skin: Skin is warm and dry. No rash noted.   Cardiovascular: Normal heart rate noted  Respiratory: Normal respiratory effort, no problems with respiration noted  Abdomen: Soft, gravid, appropriate for gestational age. Pain/Pressure: Absent     Pelvic: Vag. Bleeding: None    Cervical exam deferred        Extremities: Normal range of motion.  Edema: None  Mental Status: Normal mood and affect. Normal behavior. Normal judgment and thought content.   Urinalysis: Urine Protein: Trace Urine Glucose: Negative  Assessment and Plan:  Pregnancy: G3P0020 at 4353w2d  1. Encounter for supervision of normal first pregnancy in second trimester Preterm labor symptoms and general obstetric precautions including but not limited to vaginal bleeding, contractions, leaking of fluid and fetal movement were reviewed in detail with the patient. Please refer to After Visit Summary for other counseling recommendations.   Return in about 3 weeks (around 07/22/2015) for 1 hr GTT, 3rd trimester labs, TDap, OB Visit.   Tereso NewcomerUgonna A Sesilia Poucher, MD

## 2015-07-20 ENCOUNTER — Encounter: Payer: 59 | Admitting: Certified Nurse Midwife

## 2015-08-02 ENCOUNTER — Encounter: Payer: Self-pay | Admitting: Obstetrics and Gynecology

## 2015-08-02 ENCOUNTER — Other Ambulatory Visit: Payer: Self-pay | Admitting: Obstetrics and Gynecology

## 2015-08-02 ENCOUNTER — Ambulatory Visit (INDEPENDENT_AMBULATORY_CARE_PROVIDER_SITE_OTHER): Payer: 59 | Admitting: Obstetrics and Gynecology

## 2015-08-02 VITALS — BP 116/75 | HR 101 | Wt 186.0 lb

## 2015-08-02 DIAGNOSIS — Z23 Encounter for immunization: Secondary | ICD-10-CM | POA: Diagnosis not present

## 2015-08-02 DIAGNOSIS — Z3400 Encounter for supervision of normal first pregnancy, unspecified trimester: Secondary | ICD-10-CM | POA: Diagnosis not present

## 2015-08-02 DIAGNOSIS — Z36 Encounter for antenatal screening of mother: Secondary | ICD-10-CM

## 2015-08-02 DIAGNOSIS — Z3403 Encounter for supervision of normal first pregnancy, third trimester: Secondary | ICD-10-CM | POA: Diagnosis not present

## 2015-08-02 DIAGNOSIS — Z3483 Encounter for supervision of other normal pregnancy, third trimester: Secondary | ICD-10-CM

## 2015-08-02 NOTE — Progress Notes (Signed)
Subjective:  Elizabeth Williamson is a 25 y.o. G3P0020 at 5852w6d being seen today for ongoing prenatal care.  She is currently monitored for the following issues for this low-risk pregnancy and has Gastroesophageal reflux disease without esophagitis; Panic attack; Asthma, mild intermittent; Allergic rhinitis; and Supervision of normal first pregnancy on her problem list.  Patient reports no complaints.  Contractions: Not present. Vag. Bleeding: None.  Movement: Present. Denies leaking of fluid.   The following portions of the patient's history were reviewed and updated as appropriate: allergies, current medications, past family history, past medical history, past social history, past surgical history and problem list. Problem list updated.  Objective:   Filed Vitals:   08/02/15 1042  BP: 116/75  Pulse: 101  Weight: 186 lb (84.369 kg)    Fetal Status: Fetal Heart Rate (bpm): 145   Movement: Present     General:  Alert, oriented and cooperative. Patient is in no acute distress.  Skin: Skin is warm and dry. No rash noted.   Cardiovascular: Normal heart rate noted  Respiratory: Normal respiratory effort, no problems with respiration noted  Abdomen: Soft, gravid, appropriate for gestational age. Pain/Pressure: Absent     Pelvic: Vag. Bleeding: None Vag D/C Character: Thin   Cervical exam deferred        Extremities: Normal range of motion.  Edema: None  Mental Status: Normal mood and affect. Normal behavior. Normal judgment and thought content.   Urinalysis: Urine Protein: Negative Urine Glucose: Negative  Assessment and Plan:  Pregnancy: G3P0020 at 3552w6d  1. Encounter for supervision of normal first pregnancy in third trimester Patient is doing well Third trimester labs today  Preterm labor symptoms and general obstetric precautions including but not limited to vaginal bleeding, contractions, leaking of fluid and fetal movement were reviewed in detail with the patient. Please refer to  After Visit Summary for other counseling recommendations.  Return in about 2 weeks (around 08/16/2015).   Catalina AntiguaPeggy Samual Beals, MD

## 2015-08-02 NOTE — Addendum Note (Signed)
Addended by: Arne ClevelandHUTCHINSON, MANDY J on: 08/02/2015 11:35 AM   Modules accepted: Orders

## 2015-08-03 ENCOUNTER — Telehealth: Payer: Self-pay | Admitting: *Deleted

## 2015-08-03 LAB — CBC
HCT: 34.7 % — ABNORMAL LOW (ref 35.0–45.0)
Hemoglobin: 11 g/dL — ABNORMAL LOW (ref 11.7–15.5)
MCH: 28.6 pg (ref 27.0–33.0)
MCHC: 31.7 g/dL — AB (ref 32.0–36.0)
MCV: 90.1 fL (ref 80.0–100.0)
MPV: 10.2 fL (ref 7.5–12.5)
PLATELETS: 178 10*3/uL (ref 140–400)
RBC: 3.85 MIL/uL (ref 3.80–5.10)
RDW: 13.4 % (ref 11.0–15.0)
WBC: 8.2 10*3/uL (ref 3.8–10.8)

## 2015-08-03 LAB — GLUCOSE TOLERANCE, 1 HOUR (50G) W/O FASTING: GLUCOSE, 1 HR, GESTATIONAL: 131 mg/dL (ref <140)

## 2015-08-03 NOTE — Telephone Encounter (Signed)
Pt states she has been experiencing mid lower dull abdominal pain on and off today and discomfort in her hips. Denies any bleeding or leaking of fluid.  Informed pt that she could be experiencing discomfort related to the increased weight of the uterus and recommended a pregnancy belly band to help support her abdomen and decrease discomfort on her hips and lower back.  Also encouraged pt to drink plenty of fluids and she could take Tylenol for discomfort.  Instructed to call back if any thing changed or pain began to get worse or she experienced any vaginal bleeding. Pt acknowledged instructions.

## 2015-08-03 NOTE — Telephone Encounter (Signed)
-----   Message from Olevia BowensJacinda S Battle sent at 08/03/2015  4:00 PM EDT ----- Regarding: Advise Contact: [phone] Wants to talk to someone about ob pains and nausea she is having,  Told her to increase and fluid intake and lay down for a little bit until one of you could call her back

## 2015-08-04 LAB — RPR

## 2015-08-04 LAB — HIV ANTIBODY (ROUTINE TESTING W REFLEX): HIV 1&2 Ab, 4th Generation: NONREACTIVE

## 2015-08-08 ENCOUNTER — Encounter: Payer: Self-pay | Admitting: *Deleted

## 2015-08-08 NOTE — Progress Notes (Signed)
Order(s) created erroneously. Erroneous order ID: [id number] ° Order moved by: STARKE, JESSICA N ° Order move date/time: 08/08/2015 3:17 PM ° Source Patient: [id number] ° Source Contact: 08/02/2015 ° Destination Patient: [id number] ° Destination Contact: 02/28/2015 °

## 2015-08-08 NOTE — Progress Notes (Signed)
Per request of Dr. Jolayne Pantheronstant I have moved order and result from order # [id number] to a test patient (Test, Again - MRN: [id number]). I have spoken with Dr. Jolayne Pantheronstant and confirmed that it is okay to remove the result from this patient's chart.  Please reference [id number] for further details if needed.  Smith MinceJessica N Starke   I have reviewed and agree Harlan StainsSusan Dorr RN  AMB analyst

## 2015-08-11 ENCOUNTER — Telehealth: Payer: Self-pay

## 2015-08-11 NOTE — Telephone Encounter (Signed)
No MD will be available for her upcoming appointment on 08/15/2015. Called patient to get her rescheduled, no answer, left message instructing her to return my call here at the office

## 2015-08-15 ENCOUNTER — Encounter: Payer: 59 | Admitting: Obstetrics & Gynecology

## 2015-08-17 ENCOUNTER — Ambulatory Visit (INDEPENDENT_AMBULATORY_CARE_PROVIDER_SITE_OTHER): Payer: 59 | Admitting: Advanced Practice Midwife

## 2015-08-17 VITALS — BP 119/73 | HR 101 | Wt 188.0 lb

## 2015-08-17 DIAGNOSIS — Z3403 Encounter for supervision of normal first pregnancy, third trimester: Secondary | ICD-10-CM

## 2015-08-17 NOTE — Progress Notes (Signed)
Subjective:  Elizabeth Williamson is a 25 y.o. G3P0020 at 73101w0d being seen today for ongoing prenatal care.  She is currently monitored for the following issues for this low-risk pregnancy and has Gastroesophageal reflux disease without esophagitis; Panic attack; Asthma, mild intermittent; Allergic rhinitis; and Supervision of normal first pregnancy on her problem list.  Patient reports no complaints.  Contractions: Not present. Vag. Bleeding: None.  Movement: Present. Denies leaking of fluid.   The following portions of the patient's history were reviewed and updated as appropriate: allergies, current medications, past family history, past medical history, past social history, past surgical history and problem list. Problem list updated.  Objective:   Filed Vitals:   08/17/15 1515  BP: 119/73  Pulse: 101  Weight: 188 lb (85.276 kg)    Fetal Status: Fetal Heart Rate (bpm): 152 Fundal Height: 32 cm Movement: Present     General:  Alert, oriented and cooperative. Patient is in no acute distress.  Skin: Skin is warm and dry. No rash noted.   Cardiovascular: Normal heart rate noted  Respiratory: Normal respiratory effort, no problems with respiration noted  Abdomen: Soft, gravid, appropriate for gestational age. Pain/Pressure: Absent     Pelvic: Vag. Bleeding: None Vag D/C Character: Thin   Cervical exam deferred        Extremities: Normal range of motion.  Edema: None  Mental Status: Normal mood and affect. Normal behavior. Normal judgment and thought content.   Urinalysis: Urine Protein: Negative Urine Glucose: Negative  Assessment and Plan:  Pregnancy: G3P0020 at 6101w0d  1. Encounter for supervision of normal first pregnancy in third trimester   Preterm labor symptoms and general obstetric precautions including but not limited to vaginal bleeding, contractions, leaking of fluid and fetal movement were reviewed in detail with the patient. Please refer to After Visit Summary for other  counseling recommendations.  Return in about 2 weeks (around 08/31/2015).   Elizabeth Williamson, CNM

## 2015-08-27 DIAGNOSIS — H5213 Myopia, bilateral: Secondary | ICD-10-CM | POA: Diagnosis not present

## 2015-09-01 ENCOUNTER — Ambulatory Visit (INDEPENDENT_AMBULATORY_CARE_PROVIDER_SITE_OTHER): Payer: 59 | Admitting: Obstetrics and Gynecology

## 2015-09-01 ENCOUNTER — Encounter: Payer: Self-pay | Admitting: Obstetrics and Gynecology

## 2015-09-01 VITALS — BP 107/70 | HR 94 | Wt 189.0 lb

## 2015-09-01 DIAGNOSIS — Z3403 Encounter for supervision of normal first pregnancy, third trimester: Secondary | ICD-10-CM

## 2015-09-01 DIAGNOSIS — Z3483 Encounter for supervision of other normal pregnancy, third trimester: Secondary | ICD-10-CM

## 2015-09-01 NOTE — Patient Instructions (Addendum)
Third Trimester of Pregnancy The third trimester is from week 29 through week 42, months 7 through 9. The third trimester is a time when the fetus is growing rapidly. At the end of the ninth month, the fetus is about 20 inches in length and weighs 6-10 pounds.  BODY CHANGES Your body goes through many changes during pregnancy. The changes vary from woman to woman.   Your weight will continue to increase. You can expect to gain 25-35 pounds (11-16 kg) by the end of the pregnancy.  You may begin to get stretch marks on your hips, abdomen, and breasts.  You may urinate more often because the fetus is moving lower into your pelvis and pressing on your bladder.  You may develop or continue to have heartburn as a result of your pregnancy.  You may develop constipation because certain hormones are causing the muscles that push waste through your intestines to slow down.  You may develop hemorrhoids or swollen, bulging veins (varicose veins).  You may have pelvic pain because of the weight gain and pregnancy hormones relaxing your joints between the bones in your pelvis. Backaches may result from overexertion of the muscles supporting your posture.  You may have changes in your hair. These can include thickening of your hair, rapid growth, and changes in texture. Some women also have hair loss during or after pregnancy, or hair that feels dry or thin. Your hair will most likely return to normal after your baby is born.  Your breasts will continue to grow and be tender. A yellow discharge may leak from your breasts called colostrum.  Your belly button may stick out.  You may feel short of breath because of your expanding uterus.  You may notice the fetus "dropping," or moving lower in your abdomen.  You may have a bloody mucus discharge. This usually occurs a few days to a week before labor begins.  Your cervix becomes thin and soft (effaced) near your due date. WHAT TO EXPECT AT YOUR PRENATAL  EXAMS  You will have prenatal exams every 2 weeks until week 36. Then, you will have weekly prenatal exams. During a routine prenatal visit:  You will be weighed to make sure you and the fetus are growing normally.  Your blood pressure is taken.  Your abdomen will be measured to track your baby's growth.  The fetal heartbeat will be listened to.  Any test results from the previous visit will be discussed.  You may have a cervical check near your due date to see if you have effaced. At around 36 weeks, your caregiver will check your cervix. At the same time, your caregiver will also perform a test on the secretions of the vaginal tissue. This test is to determine if a type of bacteria, Group B streptococcus, is present. Your caregiver will explain this further. Your caregiver may ask you:  What your birth plan is.  How you are feeling.  If you are feeling the baby move.  If you have had any abnormal symptoms, such as leaking fluid, bleeding, severe headaches, or abdominal cramping.  If you are using any tobacco products, including cigarettes, chewing tobacco, and electronic cigarettes.  If you have any questions. Other tests or screenings that may be performed during your third trimester include:  Blood tests that check for low iron levels (anemia).  Fetal testing to check the health, activity level, and growth of the fetus. Testing is done if you have certain medical conditions or if   there are problems during the pregnancy.  HIV (human immunodeficiency virus) testing. If you are at high risk, you may be screened for HIV during your third trimester of pregnancy. FALSE LABOR You may feel small, irregular contractions that eventually go away. These are called Braxton Hicks contractions, or false labor. Contractions may last for hours, days, or even weeks before true labor sets in. If contractions come at regular intervals, intensify, or become painful, it is best to be seen by your  caregiver.  SIGNS OF LABOR   Menstrual-like cramps.  Contractions that are 5 minutes apart or less.  Contractions that start on the top of the uterus and spread down to the lower abdomen and back.  A sense of increased pelvic pressure or back pain.  A watery or bloody mucus discharge that comes from the vagina. If you have any of these signs before the 37th week of pregnancy, call your caregiver right away. You need to go to the hospital to get checked immediately. HOME CARE INSTRUCTIONS   Avoid all smoking, herbs, alcohol, and unprescribed drugs. These chemicals affect the formation and growth of the baby.  Do not use any tobacco products, including cigarettes, chewing tobacco, and electronic cigarettes. If you need help quitting, ask your health care provider. You may receive counseling support and other resources to help you quit.  Follow your caregiver's instructions regarding medicine use. There are medicines that are either safe or unsafe to take during pregnancy.  Exercise only as directed by your caregiver. Experiencing uterine cramps is a good sign to stop exercising.  Continue to eat regular, healthy meals.  Wear a good support bra for breast tenderness.  Do not use hot tubs, steam rooms, or saunas.  Wear your seat belt at all times when driving.  Avoid raw meat, uncooked cheese, cat litter boxes, and soil used by cats. These carry germs that can cause birth defects in the baby.  Take your prenatal vitamins.  Take 1500-2000 mg of calcium daily starting at the 20th week of pregnancy until you deliver your baby.  Try taking a stool softener (if your caregiver approves) if you develop constipation. Eat more high-fiber foods, such as fresh vegetables or fruit and whole grains. Drink plenty of fluids to keep your urine clear or pale yellow.  Take warm sitz baths to soothe any pain or discomfort caused by hemorrhoids. Use hemorrhoid cream if your caregiver approves.  If  you develop varicose veins, wear support hose. Elevate your feet for 15 minutes, 3-4 times a day. Limit salt in your diet.  Avoid heavy lifting, wear low heal shoes, and practice good posture.  Rest a lot with your legs elevated if you have leg cramps or low back pain.  Visit your dentist if you have not gone during your pregnancy. Use a soft toothbrush to brush your teeth and be gentle when you floss.  A sexual relationship may be continued unless your caregiver directs you otherwise.  Do not travel far distances unless it is absolutely necessary and only with the approval of your caregiver.  Take prenatal classes to understand, practice, and ask questions about the labor and delivery.  Make a trial run to the hospital.  Pack your hospital bag.  Prepare the baby's nursery.  Continue to go to all your prenatal visits as directed by your caregiver. SEEK MEDICAL CARE IF:  You are unsure if you are in labor or if your water has broken.  You have dizziness.  You have   mild pelvic cramps, pelvic pressure, or nagging pain in your abdominal area.  You have persistent nausea, vomiting, or diarrhea.  You have a bad smelling vaginal discharge.  You have pain with urination. SEEK IMMEDIATE MEDICAL CARE IF:   You have a fever.  You are leaking fluid from your vagina.  You have spotting or bleeding from your vagina.  You have severe abdominal cramping or pain.  You have rapid weight loss or gain.  You have shortness of breath with chest pain.  You notice sudden or extreme swelling of your face, hands, ankles, feet, or legs.  You have not felt your baby move in over an hour.  You have severe headaches that do not go away with medicine.  You have vision changes.   This information is not intended to replace advice given to you by your health care provider. Make sure you discuss any questions you have with your health care provider.   Document Released: 03/27/2001 Document  Revised: 04/23/2014 Document Reviewed: 06/03/2012 Elsevier Interactive Patient Education 2016 [employer] Contraception Choices Contraception (birth control) is the use of any methods or devices to prevent pregnancy. Below are some methods to help avoid pregnancy. HORMONAL METHODS   Contraceptive implant. This is a thin, plastic tube containing progesterone hormone. It does not contain estrogen hormone. Your health care provider inserts the tube in the inner part of the upper arm. The tube can remain in place for up to 3 years. After 3 years, the implant must be removed. The implant prevents the ovaries from releasing an egg (ovulation), thickens the cervical mucus to prevent sperm from entering the uterus, and thins the lining of the inside of the uterus.  Progesterone-only injections. These injections are given every 3 months by your health care provider to prevent pregnancy. This synthetic progesterone hormone stops the ovaries from releasing eggs. It also thickens cervical mucus and changes the uterine lining. This makes it harder for sperm to survive in the uterus.  Birth control pills. These pills contain estrogen and progesterone hormone. They work by preventing the ovaries from releasing eggs (ovulation). They also cause the cervical mucus to thicken, preventing the sperm from entering the uterus. Birth control pills are prescribed by a health care provider.Birth control pills can also be used to treat heavy periods.  Minipill. This type of birth control pill contains only the progesterone hormone. They are taken every day of each month and must be prescribed by your health care provider.  Birth control patch. The patch contains hormones similar to those in birth control pills. It must be changed once a week and is prescribed by a health care provider.  Vaginal ring. The ring contains hormones similar to those in birth control pills. It is left in the vagina for 3 weeks, removed for 1  week, and then a new one is put back in place. The patient must be comfortable inserting and removing the ring from the vagina.A health care provider's prescription is necessary.  Emergency contraception. Emergency contraceptives prevent pregnancy after unprotected sexual intercourse. This pill can be taken right after sex or up to 5 days after unprotected sex. It is most effective the sooner you take the pills after having sexual intercourse. Most emergency contraceptive pills are available without a prescription. Check with your pharmacist. Do not use emergency contraception as your only form of birth control. BARRIER METHODS   Female condom. This is a thin sheath (latex or rubber) that is worn over the penis during   sexual intercourse. It can be used with spermicide to increase effectiveness.  Female condom. This is a soft, loose-fitting sheath that is put into the vagina before sexual intercourse.  Diaphragm. This is a soft, latex, dome-shaped barrier that must be fitted by a health care provider. It is inserted into the vagina, along with a spermicidal jelly. It is inserted before intercourse. The diaphragm should be left in the vagina for 6 to 8 hours after intercourse.  Cervical cap. This is a round, soft, latex or plastic cup that fits over the cervix and must be fitted by a health care provider. The cap can be left in place for up to 48 hours after intercourse.  Sponge. This is a soft, circular piece of polyurethane foam. The sponge has spermicide in it. It is inserted into the vagina after wetting it and before sexual intercourse.  Spermicides. These are chemicals that kill or block sperm from entering the cervix and uterus. They come in the form of creams, jellies, suppositories, foam, or tablets. They do not require a prescription. They are inserted into the vagina with an applicator before having sexual intercourse. The process must be repeated every time you have sexual  intercourse. INTRAUTERINE CONTRACEPTION  Intrauterine device (IUD). This is a T-shaped device that is put in a woman's uterus during a menstrual period to prevent pregnancy. There are 2 types:  Copper IUD. This type of IUD is wrapped in copper wire and is placed inside the uterus. Copper makes the uterus and fallopian tubes produce a fluid that kills sperm. It can stay in place for 10 years.  Hormone IUD. This type of IUD contains the hormone progestin (synthetic progesterone). The hormone thickens the cervical mucus and prevents sperm from entering the uterus, and it also thins the uterine lining to prevent implantation of a fertilized egg. The hormone can weaken or kill the sperm that get into the uterus. It can stay in place for 3-5 years, depending on which type of IUD is used. PERMANENT METHODS OF CONTRACEPTION  Female tubal ligation. This is when the woman's fallopian tubes are surgically sealed, tied, or blocked to prevent the egg from traveling to the uterus.  Hysteroscopic sterilization. This involves placing a small coil or insert into each fallopian tube. Your doctor uses a technique called hysteroscopy to do the procedure. The device causes scar tissue to form. This results in permanent blockage of the fallopian tubes, so the sperm cannot fertilize the egg. It takes about 3 months after the procedure for the tubes to become blocked. You must use another form of birth control for these 3 months.  Female sterilization. This is when the female has the tubes that carry sperm tied off (vasectomy).This blocks sperm from entering the vagina during sexual intercourse. After the procedure, the man can still ejaculate fluid (semen). NATURAL PLANNING METHODS  Natural family planning. This is not having sexual intercourse or using a barrier method (condom, diaphragm, cervical cap) on days the woman could become pregnant.  Calendar method. This is keeping track of the length of each menstrual cycle  and identifying when you are fertile.  Ovulation method. This is avoiding sexual intercourse during ovulation.  Symptothermal method. This is avoiding sexual intercourse during ovulation, using a thermometer and ovulation symptoms.  Post-ovulation method. This is timing sexual intercourse after you have ovulated. Regardless of which type or method of contraception you choose, it is important that you use condoms to protect against the transmission of sexually transmitted infections (STIs).   Talk with your health care provider about which form of contraception is most appropriate for you.   This information is not intended to replace advice given to you by your health care provider. Make sure you discuss any questions you have with your health care provider.   Document Released: 04/02/2005 Document Revised: 04/07/2013 Document Reviewed: 09/25/2012 Elsevier Interactive Patient Education 2016 [employer] Inc[employer]  

## 2015-09-01 NOTE — Progress Notes (Signed)
Subjective:  Elizabeth Williamson is a 25 y.o. G3P0020 at 7747w1d being seen today for ongoing prenatal care.  She is currently monitored for the following issues for this low-risk pregnancy and has Gastroesophageal reflux disease without esophagitis; Panic attack; Asthma, mild intermittent; Allergic rhinitis; and Supervision of normal first pregnancy on her problem list.  Patient reports no complaints.  Contractions: Irregular. Vag. Bleeding: None.  Movement: Present. Denies leaking of fluid.   The following portions of the patient's history were reviewed and updated as appropriate: allergies, current medications, past family history, past medical history, past social history, past surgical history and problem list. Problem list updated.  Objective:   Filed Vitals:   09/01/15 1559  BP: 107/70  Pulse: 94  Weight: 189 lb (85.73 kg)    Fetal Status: Fetal Heart Rate (bpm): 147   Movement: Present     General:  Alert, oriented and cooperative. Patient is in no acute distress.  Skin: Skin is warm and dry. No rash noted.   Cardiovascular: Normal heart rate noted  Respiratory: Normal respiratory effort, no problems with respiration noted  Abdomen: Soft, gravid, appropriate for gestational age. Pain/Pressure: Absent     Pelvic: Vag. Bleeding: None Vag D/C Character: Thin   Cervical exam deferred        Extremities: Normal range of motion.  Edema: None  Mental Status: Normal mood and affect. Normal behavior. Normal judgment and thought content.   Urinalysis:      Assessment and Plan:  Pregnancy: G3P0020 at 6147w1d Doing well  Encounter for supervision of normal first pregnancy in third trimester Preterm labor symptoms and general obstetric precautions including but not limited to vaginal bleeding, contractions, leaking of fluid and fetal movement were reviewed in detail with the patient. Please refer to After Visit Summary for other counseling recommendations.  Return in about 2 weeks (around  09/15/2015).   Danae Orleanseirdre C Laconda Basich, CNM

## 2015-09-15 ENCOUNTER — Encounter (INDEPENDENT_AMBULATORY_CARE_PROVIDER_SITE_OTHER): Payer: 59 | Admitting: *Deleted

## 2015-09-15 ENCOUNTER — Ambulatory Visit (INDEPENDENT_AMBULATORY_CARE_PROVIDER_SITE_OTHER): Payer: 59 | Admitting: Advanced Practice Midwife

## 2015-09-15 VITALS — BP 129/69 | HR 101 | Wt 193.6 lb

## 2015-09-15 DIAGNOSIS — Z3403 Encounter for supervision of normal first pregnancy, third trimester: Secondary | ICD-10-CM | POA: Diagnosis not present

## 2015-09-15 DIAGNOSIS — Z3483 Encounter for supervision of other normal pregnancy, third trimester: Secondary | ICD-10-CM

## 2015-09-15 DIAGNOSIS — Z113 Encounter for screening for infections with a predominantly sexual mode of transmission: Secondary | ICD-10-CM | POA: Diagnosis not present

## 2015-09-15 DIAGNOSIS — Z36 Encounter for antenatal screening of mother: Secondary | ICD-10-CM | POA: Diagnosis not present

## 2015-09-15 DIAGNOSIS — Z3400 Encounter for supervision of normal first pregnancy, unspecified trimester: Secondary | ICD-10-CM | POA: Diagnosis not present

## 2015-09-15 LAB — OB RESULTS CONSOLE GBS: GBS: POSITIVE

## 2015-09-15 NOTE — Progress Notes (Signed)
Subjective:  Elizabeth Williamson is a 25 y.o. G3P0020 at 162w1d being seen today for ongoing prenatal care.  She is currently monitored for the following issues for this low-risk pregnancy and has Gastroesophageal reflux disease without esophagitis; Panic attack; Asthma, mild intermittent; Allergic rhinitis; and Supervision of normal first pregnancy on her problem list.  Patient reports pelvic pressure.  Contractions: Not present. Vag. Bleeding: None.  Movement: Present. Denies leaking of fluid.   The following portions of the patient's history were reviewed and updated as appropriate: allergies, current medications, past family history, past medical history, past social history, past surgical history and problem list. Problem list updated.  Objective:   Filed Vitals:   09/15/15 1513  BP: 129/69  Pulse: 101  Weight: 193 lb 9.6 oz (87.816 kg)    Fetal Status: Fetal Heart Rate (bpm): 146 Fundal Height: 36 cm Movement: Present  Presentation: Vertex  General:  Alert, oriented and cooperative. Patient is in no acute distress.  Skin: Skin is warm and dry. No rash noted.   Cardiovascular: Normal heart rate noted  Respiratory: Normal respiratory effort, no problems with respiration noted  Abdomen: Soft, gravid, appropriate for gestational age. Pain/Pressure: Absent     Pelvic: Vag. Bleeding: None Vag D/C Character: Thin   Cervical exam performed Dilation: Fingertip Effacement (%): 50 Station: -3  Extremities: Normal range of motion.  Edema: None  Mental Status: Normal mood and affect. Normal behavior. Normal judgment and thought content.   Urinalysis: Urine Protein: 1+ Urine Glucose: Negative  Assessment and Plan:  Pregnancy: G3P0020 at [redacted]w[redacted]d  1. Encounter for supervision of normal first pregnancy in third trimester  - Culture, beta strep (group b only) - GC[facility]Chlamydia probe amp ([place]  Term labor symptoms and general obstetric precautions including but not limited to  vaginal bleeding, contractions, leaking of fluid and fetal movement were reviewed in detail with the patient. Please refer to After Visit Summary for other counseling recommendations.  Return in about 1 week (around 09/22/2015).   Dorathy KinsmanVirginia Treyton Slimp, CNM

## 2015-09-15 NOTE — Patient Instructions (Signed)
Braxton Hicks Contractions °Contractions of the uterus can occur throughout pregnancy. Contractions are not always a sign that you are in labor.  °WHAT ARE BRAXTON HICKS CONTRACTIONS?  °Contractions that occur before labor are called Braxton Hicks contractions, or false labor. Toward the end of pregnancy (32-34 weeks), these contractions can develop more often and may become more forceful. This is not true labor because these contractions do not result in opening (dilatation) and thinning of the cervix. They are sometimes difficult to tell apart from true labor because these contractions can be forceful and people have different pain tolerances. You should not feel embarrassed if you go to the hospital with false labor. Sometimes, the only way to tell if you are in true labor is for your health care provider to look for changes in the cervix. °If there are no prenatal problems or other health problems associated with the pregnancy, it is completely safe to be sent home with false labor and await the onset of true labor. °HOW CAN YOU TELL THE DIFFERENCE BETWEEN TRUE AND FALSE LABOR? °False Labor °· The contractions of false labor are usually shorter and not as hard as those of true labor.   °· The contractions are usually irregular.   °· The contractions are often felt in the front of the lower abdomen and in the groin.   °· The contractions may go away when you walk around or change positions while lying down.   °· The contractions get weaker and are shorter lasting as time goes on.   °· The contractions do not usually become progressively stronger, regular, and closer together as with true labor.   °True Labor °· Contractions in true labor last 30-70 seconds, become very regular, usually become more intense, and increase in frequency.   °· The contractions do not go away with walking.   °· The discomfort is usually felt in the top of the uterus and spreads to the lower abdomen and low back.   °· True labor can be  determined by your health care provider with an exam. This will show that the cervix is dilating and getting thinner.   °WHAT TO REMEMBER °· Keep up with your usual exercises and follow other instructions given by your health care provider.   °· Take medicines as directed by your health care provider.   °· Keep your regular prenatal appointments.   °· Eat and drink lightly if you think you are going into labor.   °· If Braxton Hicks contractions are making you uncomfortable:   °¨ Change your position from lying down or resting to walking, or from walking to resting.   °¨ Sit and rest in a tub of warm water.   °¨ Drink 2-3 glasses of water. Dehydration may cause these contractions.   °¨ Do slow and deep breathing several times an hour.   °WHEN SHOULD I SEEK IMMEDIATE MEDICAL CARE? °Seek immediate medical care if: °· Your contractions become stronger, more regular, and closer together.   °· You have fluid leaking or gushing from your vagina.   °· You have a fever.   °· You pass blood-tinged mucus.   °· You have vaginal bleeding.   °· You have continuous abdominal pain.   °· You have low back pain that you never had before.   °· You feel your baby's head pushing down and causing pelvic pressure.   °· Your baby is not moving as much as it used to.   °  °This information is not intended to replace advice given to you by your health care provider. Make sure you discuss any questions you have with your health care   provider. °  °Document Released: 04/02/2005 Document Revised: 04/07/2013 Document Reviewed: 01/12/2013 °Elsevier Interactive Patient Education ©2016 [employer] ° °

## 2015-09-16 LAB — GC/CHLAMYDIA PROBE AMP (~~LOC~~) NOT AT ARMC
Chlamydia: NEGATIVE
Neisseria Gonorrhea: NEGATIVE

## 2015-09-17 ENCOUNTER — Encounter: Payer: Self-pay | Admitting: Advanced Practice Midwife

## 2015-09-17 DIAGNOSIS — O99820 Streptococcus B carrier state complicating pregnancy: Secondary | ICD-10-CM | POA: Insufficient documentation

## 2015-09-17 LAB — CULTURE, BETA STREP (GROUP B ONLY)

## 2015-09-21 ENCOUNTER — Ambulatory Visit (INDEPENDENT_AMBULATORY_CARE_PROVIDER_SITE_OTHER): Payer: 59 | Admitting: Obstetrics & Gynecology

## 2015-09-21 ENCOUNTER — Encounter: Payer: Self-pay | Admitting: *Deleted

## 2015-09-21 VITALS — BP 104/70 | HR 93 | Wt 193.0 lb

## 2015-09-21 DIAGNOSIS — Z3403 Encounter for supervision of normal first pregnancy, third trimester: Secondary | ICD-10-CM

## 2015-09-21 NOTE — Progress Notes (Signed)
Subjective:  Elizabeth Williamson is a 25 y.o. G3P0020 at 1544w0d being seen today for ongoing prenatal care.  She is currently monitored for the following issues for this low-risk pregnancy and has Gastroesophageal reflux disease without esophagitis; Panic attack; Asthma, mild intermittent; Allergic rhinitis; Supervision of normal first pregnancy; and Group B Streptococcus carrier, antepartum on her problem list.  Patient reports occasional contractions.  Contractions: Irregular. Vag. Bleeding: None.  Movement: Present. Denies leaking of fluid.   The following portions of the patient's history were reviewed and updated as appropriate: allergies, current medications, past family history, past medical history, past social history, past surgical history and problem list. Problem list updated.  Objective:   Filed Vitals:   09/21/15 0914  BP: 104/70  Pulse: 93  Weight: 193 lb (87.544 kg)    Fetal Status: Fetal Heart Rate (bpm): 145 Fundal Height: 38 cm Movement: Present     General:  Alert, oriented and cooperative. Patient is in no acute distress.  Skin: Skin is warm and dry. No rash noted.   Cardiovascular: Normal heart rate noted  Respiratory: Normal respiratory effort, no problems with respiration noted  Abdomen: Soft, gravid, appropriate for gestational age. Pain/Pressure: Absent     Pelvic: Vag. Bleeding: None Vag D/C Character: Thin   Cervical exam deferred        Extremities: Normal range of motion.  Edema: Trace  Mental Status: Normal mood and affect. Normal behavior. Normal judgment and thought content.   Urinalysis: Urine Protein: Negative Urine Glucose: Negative  Assessment and Plan:  Pregnancy: G3P0020 at 3544w0d  Encounter for supervision of normal first pregnancy in third trimester Term labor symptoms and general obstetric precautions including but not limited to vaginal bleeding, contractions, leaking of fluid and fetal movement were reviewed in detail with the patient. Please  refer to After Visit Summary for other counseling recommendations.  Return in about 1 week (around 09/28/2015) for OB Visit.   Tereso NewcomerUgonna A Rebeccah Ivins, MD

## 2015-09-21 NOTE — Patient Instructions (Signed)
Return to clinic for any scheduled appointments or obstetric concerns, or go to [facility] for evaluation  

## 2015-09-29 ENCOUNTER — Ambulatory Visit (INDEPENDENT_AMBULATORY_CARE_PROVIDER_SITE_OTHER): Payer: 59 | Admitting: Obstetrics and Gynecology

## 2015-09-29 ENCOUNTER — Encounter: Payer: Self-pay | Admitting: Obstetrics and Gynecology

## 2015-09-29 ENCOUNTER — Encounter: Payer: Self-pay | Admitting: *Deleted

## 2015-09-29 VITALS — BP 109/68 | HR 82 | Wt 193.0 lb

## 2015-09-29 DIAGNOSIS — O99820 Streptococcus B carrier state complicating pregnancy: Secondary | ICD-10-CM

## 2015-09-29 DIAGNOSIS — Z3403 Encounter for supervision of normal first pregnancy, third trimester: Secondary | ICD-10-CM

## 2015-09-29 NOTE — Progress Notes (Signed)
Pt wants cervical check. 

## 2015-09-29 NOTE — Progress Notes (Signed)
Subjective:  Elizabeth Williamson is a 25 y.o. G3P0020 at 3435w1d being seen today for ongoing prenatal care.  She is currently monitored for the following issues for this low-risk pregnancy and has Gastroesophageal reflux disease without esophagitis; Panic attack; Asthma, mild intermittent; Allergic rhinitis; Supervision of normal first pregnancy; and Group B Streptococcus carrier, antepartum on her problem list.  Patient reports no complaints.  Contractions: Irregular. Vag. Bleeding: None.  Movement: Present. Denies leaking of fluid.   The following portions of the patient's history were reviewed and updated as appropriate: allergies, current medications, past family history, past medical history, past social history, past surgical history and problem list. Problem list updated.  Objective:   Filed Vitals:   09/29/15 1117  BP: 109/68  Pulse: 82  Weight: 193 lb (87.544 kg)    Fetal Status: Fetal Heart Rate (bpm): 136 Fundal Height: 38 cm Movement: Present  Presentation: Vertex  General:  Alert, oriented and cooperative. Patient is in no acute distress.  Skin: Skin is warm and dry. No rash noted.   Cardiovascular: Normal heart rate noted  Respiratory: Normal respiratory effort, no problems with respiration noted  Abdomen: Soft, gravid, appropriate for gestational age. Pain/Pressure: Present     Pelvic: Cervical exam performed Dilation: 1 Effacement (%): 50 Station: -3  Extremities: Normal range of motion.  Edema: Trace  Mental Status: Normal mood and affect. Normal behavior. Normal judgment and thought content.   Urinalysis:      Assessment and Plan:  Pregnancy: G3P0020 at 5735w1d  1. Encounter for supervision of normal first pregnancy in third trimester Patient is doing well   2. Group B Streptococcus carrier, antepartum Will need prophylaxis in labor  Term labor symptoms and general obstetric precautions including but not limited to vaginal bleeding, contractions, leaking of fluid and  fetal movement were reviewed in detail with the patient. Please refer to After Visit Summary for other counseling recommendations.  Return in about 1 week (around 10/06/2015).   Catalina AntiguaPeggy Neila Teem, MD

## 2015-10-05 ENCOUNTER — Ambulatory Visit (INDEPENDENT_AMBULATORY_CARE_PROVIDER_SITE_OTHER): Payer: 59 | Admitting: Obstetrics & Gynecology

## 2015-10-05 ENCOUNTER — Encounter: Payer: Self-pay | Admitting: Obstetrics & Gynecology

## 2015-10-05 VITALS — BP 124/74 | HR 94 | Wt 194.0 lb

## 2015-10-05 DIAGNOSIS — Z3403 Encounter for supervision of normal first pregnancy, third trimester: Secondary | ICD-10-CM

## 2015-10-05 DIAGNOSIS — J4520 Mild intermittent asthma, uncomplicated: Secondary | ICD-10-CM

## 2015-10-05 DIAGNOSIS — O99820 Streptococcus B carrier state complicating pregnancy: Secondary | ICD-10-CM

## 2015-10-05 NOTE — Patient Instructions (Addendum)
Augmentation of Labor Augmentation of labor is when steps are taken to stimulate and strengthen uterine contractions during labor. This may be done when the contractions have slowed down or stopped, delaying progress of labor and delivery of the baby. Before beginning augmentation of labor, the health care provider will evaluate the condition of the mother and baby, the size and position of the baby, and the size of the birth canal. WHAT ARE THE REASONS FOR LABOR AUGMENTATION? Reasons for augmentation of labor include:   Slow labor (prolonged first and second stage of labor) that has been associated with increased maternal risks, such as chorioamnionitis, postpartum hemorrhage, operative vaginal delivery, or third-degree or fourth-degree perineal lacerations.  Decreased average length of labor. WHAT METHODS ARE USED FOR LABOR AUGMENTATION? Various methods may be used for augmentation of labor, including:   Oxytocin medicine. This medicine stimulates contractions. It is given through an IV access tube inserted into a vein.  Breaking the fluid-filled sac that surrounds the fetus (amniotic sac).  Stripping the membranes. The health care provider separates amniotic sac tissue from the cervix, causing the release of a hormone called progesterone that can stimulate uterine contractions.  Nipple stimulation.  Stimulation of certain pressure points on the ankles.  Manual or mechanical dilation of the cervix. WHAT ARE THE RISKS ASSOCIATED WITH LABOR AUGMENTATION?  Overstimulation of the uterine contractions (continuous, prolonged, very strong contractions), causing fetal distress.   Increased chance of infection for the mother and baby.   Uterine tearing (rupture).   Breaking off (abruption) of the placenta.   Increased chance of cesarean, forceps, or vacuum delivery.  WHAT ARE SOME REASONS FOR NOT DOING LABOR AUGMENTATION? Augmentation of labor should not be done if:  The baby is too  big for the birth canal. This can be confirmed by ultrasonography.   The umbilical cord drops in front of the baby's head or breech part (prolapsed cord).   The mother had a previous cesarean delivery with a vertical incision in the uterus (or the kind of incision used is not known). High dose oxytocin should not be used if the mother had a previous cesarean delivery of any kind.   The mother had previous surgery on or into the uterus.   The mother has herpes.   The mother has cervical cancer.   The baby is lying sideways.   The mother's pelvis is deformed.   The mother is pregnant with more than two babies.    This information is not intended to replace advice given to you by your health care provider. Make sure you discuss any questions you have with your health care provider.   Document Released: 09/25/2006 Document Revised: 04/23/2014 Document Reviewed: 10/30/2012 Elsevier Interactive Patient Education 2016 [employer] Group B streptococcus (GBS) is a type of bacteria often found in healthy women. GBS is not the same as the bacteria that causes strep throat. You may have GBS in your vagina, rectum, or bladder. GBS does not spread through sexual contact, but it can be passed to a baby during childbirth. This can be dangerous for your baby. It is not dangerous to you and usually does not cause any symptoms. Your health care provider may test you for GBS when your pregnancy is between 35 and 37 weeks. GBS is dangerous only during birth, so there is no need to test for it earlier. It is possible to have GBS during pregnancy and never pass it to your baby. If your test results are positive for  GBS, your health care provider may recommend giving you antibiotic medicine during delivery to make sure your baby stays healthy. RISK FACTORS You are more likely to pass GBS to your baby if:   Your water breaks (ruptured membrane) or you go into labor before 37 weeks.  Your water breaks  18 hours before you deliver.  You passed GBS during a previous pregnancy.  You have a urinary tract infection caused by GBS any time during pregnancy.  You have a fever during labor. SYMPTOMS Most women who have GBS do not have any symptoms. If you have a urinary tract infection caused by GBS, you might have frequent or painful urination and fever. Babies who get GBS usually show symptoms within 7 days of birth. Symptoms may include:   Breathing problems.  Heart and blood pressure problems.  Digestive and kidney problems. DIAGNOSIS Routine screening for GBS is recommended for all pregnant women. A health care provider takes a sample of the fluid in your vagina and rectum with a swab. It is then sent to a lab to be checked for GBS. A sample of your urine may also be checked for the bacteria.  TREATMENT If you test positive for GBS, you may need treatment with an antibiotic medicine during labor. As soon as you go into labor, or as soon as your membranes rupture, you will get the antibiotic medicine through an IV access. You will continue to get the medicine until after you give birth. You do not need antibiotic medicine if you are having a cesarean delivery.If your baby shows signs or symptoms of GBS after birth, your baby can also be treated with an antibiotic medicine. HOME CARE INSTRUCTIONS   Take all antibiotic medicine as prescribed by your health care provider. Only take medicine as directed.   Continue with prenatal visits and care.   Keep all follow-up appointments.  SEEK MEDICAL CARE IF:   You have pain when you urinate.   You have to urinate frequently.   You have a fever.  SEEK IMMEDIATE MEDICAL CARE IF:   Your membranes rupture.  You go into labor.   This information is not intended to replace advice given to you by your health care provider. Make sure you discuss any questions you have with your health care provider.   Document Released: 07/10/2007  Document Revised: 04/07/2013 Document Reviewed: 01/23/2013 Elsevier Interactive Patient Education [employer]

## 2015-10-05 NOTE — Progress Notes (Signed)
Subjective:  Elizabeth Williamson is a 25 y.o. G3P0020 at 4879w0d being seen today for ongoing prenatal care.  She is currently monitored for the following issues for this low-risk pregnancy and has Gastroesophageal reflux disease without esophagitis; Panic attack; Asthma, mild intermittent; Allergic rhinitis; Supervision of normal first pregnancy; and Group B Streptococcus carrier, antepartum on her problem list.  Patient reports no complaints.  Contractions: Irregular. Vag. Bleeding: None.  Movement: Present. Denies leaking of fluid.   The following portions of the patient's history were reviewed and updated as appropriate: allergies, current medications, past family history, past medical history, past social history, past surgical history and problem list. Problem list updated.  Objective:   Filed Vitals:   10/05/15 1018  BP: 124/74  Pulse: 94  Weight: 194 lb (87.998 kg)    Fetal Status: Fetal Heart Rate (bpm): 145 Fundal Height: 39 cm Movement: Present  Presentation: Vertex  General:  Alert, oriented and cooperative. Patient is in no acute distress.  Skin: Skin is warm and dry. No rash noted.   Cardiovascular: Normal heart rate noted  Respiratory: Normal respiratory effort, no problems with respiration noted  Abdomen: Soft, gravid, appropriate for gestational age. Pain/Pressure: Present     Pelvic: Cervical exam performed Dilation: 2 Effacement (%): 50 Station: -3  Extremities: Normal range of motion.  Edema: Trace  Mental Status: Normal mood and affect. Normal behavior. Normal judgment and thought content.   Urinalysis:      Assessment and Plan:  Pregnancy: G3P0020 at 8979w0d  1. Encounter for supervision of normal first pregnancy in third trimester Labor precautions previewed  2. Group B Streptococcus carrier, antepartum Needs PCN in labor  3. Asthma, mild intermittent, uncomplicated asymptomatic  Term labor symptoms and general obstetric precautions including but not limited  to vaginal bleeding, contractions, leaking of fluid and fetal movement were reviewed in detail with the patient. Please refer to After Visit Summary for other counseling recommendations.  Return in about 1 week (around 10/12/2015).   Willodean Rosenthalarolyn Harraway-Smith, MD

## 2015-10-10 ENCOUNTER — Ambulatory Visit (INDEPENDENT_AMBULATORY_CARE_PROVIDER_SITE_OTHER): Payer: 59 | Admitting: Family Medicine

## 2015-10-10 VITALS — BP 119/75 | HR 97 | Wt 195.0 lb

## 2015-10-10 DIAGNOSIS — Z3403 Encounter for supervision of normal first pregnancy, third trimester: Secondary | ICD-10-CM

## 2015-10-10 DIAGNOSIS — Z3483 Encounter for supervision of other normal pregnancy, third trimester: Secondary | ICD-10-CM

## 2015-10-10 NOTE — Progress Notes (Signed)
Subjective:  Elizabeth Williamson is a 25 y.o. G3P0020 at 2992w5d being seen today for ongoing prenatal care.  She is currently monitored for the following issues for this low-risk pregnancy and has Gastroesophageal reflux disease without esophagitis; Panic attack; Asthma, mild intermittent; Allergic rhinitis; Supervision of normal first pregnancy; and Group B Streptococcus carrier, antepartum on her problem list.  Patient reports backache.  Contractions: Irregular. Vag. Bleeding: None.  Movement: Present. Denies leaking of fluid.   The following portions of the patient's history were reviewed and updated as appropriate: allergies, current medications, past family history, past medical history, past social history, past surgical history and problem list. Problem list updated.  Objective:   Filed Vitals:   10/10/15 1420  BP: 119/75  Pulse: 97  Weight: 195 lb (88.451 kg)    Fetal Status: Fetal Heart Rate (bpm): 146 Fundal Height: 36 cm Movement: Present  Presentation: Vertex  General:  Alert, oriented and cooperative. Patient is in no acute distress.  Skin: Skin is warm and dry. No rash noted.   Cardiovascular: Normal heart rate noted  Respiratory: Normal respiratory effort, no problems with respiration noted  Abdomen: Soft, gravid, appropriate for gestational age. Pain/Pressure: Present     Pelvic: Cervical exam performed Dilation: 2.5 Effacement (%): 80 Station: -1  Extremities: Normal range of motion.  Edema: Trace  Mental Status: Normal mood and affect. Normal behavior. Normal judgment and thought content.   Urinalysis: Urine Protein: Trace Urine Glucose: Negative  Assessment and Plan:  Pregnancy: G3P0020 at 3692w5d  1. Encounter for supervision of normal first pregnancy in third trimester Continue routine prenatal care. Membranes stripped in office   Term labor symptoms and general obstetric precautions including but not limited to vaginal bleeding, contractions, leaking of fluid and  fetal movement were reviewed in detail with the patient. Please refer to After Visit Summary for other counseling recommendations.  Return in 1 week (on 10/17/2015) for OB visit and NST, NST only in 3-4 days.   Reva Boresanya S Raelea Gosse, MD

## 2015-10-10 NOTE — Patient Instructions (Signed)
Third Trimester of Pregnancy The third trimester is from week 29 through week 42, months 7 through 9. The third trimester is a time when the fetus is growing rapidly. At the end of the ninth month, the fetus is about 20 inches in length and weighs 6-10 pounds.  BODY CHANGES Your body goes through many changes during pregnancy. The changes vary from woman to woman.   Your weight will continue to increase. You can expect to gain 25-35 pounds (11-16 kg) by the end of the pregnancy.  You may begin to get stretch marks on your hips, abdomen, and breasts.  You may urinate more often because the fetus is moving lower into your pelvis and pressing on your bladder.  You may develop or continue to have heartburn as a result of your pregnancy.  You may develop constipation because certain hormones are causing the muscles that push waste through your intestines to slow down.  You may develop hemorrhoids or swollen, bulging veins (varicose veins).  You may have pelvic pain because of the weight gain and pregnancy hormones relaxing your joints between the bones in your pelvis. Backaches may result from overexertion of the muscles supporting your posture.  You may have changes in your hair. These can include thickening of your hair, rapid growth, and changes in texture. Some women also have hair loss during or after pregnancy, or hair that feels dry or thin. Your hair will most likely return to normal after your baby is born.  Your breasts will continue to grow and be tender. A yellow discharge may leak from your breasts called colostrum.  Your belly button may stick out.  You may feel short of breath because of your expanding uterus.  You may notice the fetus "dropping," or moving lower in your abdomen.  You may have a bloody mucus discharge. This usually occurs a few days to a week before labor begins.  Your cervix becomes thin and soft (effaced) near your due date. WHAT TO EXPECT AT YOUR  PRENATAL EXAMS  You will have prenatal exams every 2 weeks until week 36. Then, you will have weekly prenatal exams. During a routine prenatal visit:  You will be weighed to make sure you and the fetus are growing normally.  Your blood pressure is taken.  Your abdomen will be measured to track your baby's growth.  The fetal heartbeat will be listened to.  Any test results from the previous visit will be discussed.  You may have a cervical check near your due date to see if you have effaced. At around 36 weeks, your caregiver will check your cervix. At the same time, your caregiver will also perform a test on the secretions of the vaginal tissue. This test is to determine if a type of bacteria, Group B streptococcus, is present. Your caregiver will explain this further. Your caregiver may ask you:  What your birth plan is.  How you are feeling.  If you are feeling the baby move.  If you have had any abnormal symptoms, such as leaking fluid, bleeding, severe headaches, or abdominal cramping.  If you are using any tobacco products, including cigarettes, chewing tobacco, and electronic cigarettes.  If you have any questions. Other tests or screenings that may be performed during your third trimester include:  Blood tests that check for low iron levels (anemia).  Fetal testing to check the health, activity level, and growth of the fetus. Testing is done if you have certain medical conditions or if   there are problems during the pregnancy.  HIV (human immunodeficiency virus) testing. If you are at high risk, you may be screened for HIV during your third trimester of pregnancy. FALSE LABOR You may feel small, irregular contractions that eventually go away. These are called Braxton Hicks contractions, or false labor. Contractions may last for hours, days, or even weeks before true labor sets in. If contractions come at regular intervals, intensify, or become painful, it is best to be seen  by your caregiver.  SIGNS OF LABOR   Menstrual-like cramps.  Contractions that are 5 minutes apart or less.  Contractions that start on the top of the uterus and spread down to the lower abdomen and back.  A sense of increased pelvic pressure or back pain.  A watery or bloody mucus discharge that comes from the vagina. If you have any of these signs before the 37th week of pregnancy, call your caregiver right away. You need to go to the hospital to get checked immediately. HOME CARE INSTRUCTIONS   Avoid all smoking, herbs, alcohol, and unprescribed drugs. These chemicals affect the formation and growth of the baby.  Do not use any tobacco products, including cigarettes, chewing tobacco, and electronic cigarettes. If you need help quitting, ask your health care provider. You may receive counseling support and other resources to help you quit.  Follow your caregiver's instructions regarding medicine use. There are medicines that are either safe or unsafe to take during pregnancy.  Exercise only as directed by your caregiver. Experiencing uterine cramps is a good sign to stop exercising.  Continue to eat regular, healthy meals.  Wear a good support bra for breast tenderness.  Do not use hot tubs, steam rooms, or saunas.  Wear your seat belt at all times when driving.  Avoid raw meat, uncooked cheese, cat litter boxes, and soil used by cats. These carry germs that can cause birth defects in the baby.  Take your prenatal vitamins.  Take 1500-2000 mg of calcium daily starting at the 20th week of pregnancy until you deliver your baby.  Try taking a stool softener (if your caregiver approves) if you develop constipation. Eat more high-fiber foods, such as fresh vegetables or fruit and whole grains. Drink plenty of fluids to keep your urine clear or pale yellow.  Take warm sitz baths to soothe any pain or discomfort caused by hemorrhoids. Use hemorrhoid cream if your caregiver  approves.  If you develop varicose veins, wear support hose. Elevate your feet for 15 minutes, 3-4 times a day. Limit salt in your diet.  Avoid heavy lifting, wear low heal shoes, and practice good posture.  Rest a lot with your legs elevated if you have leg cramps or low back pain.  Visit your dentist if you have not gone during your pregnancy. Use a soft toothbrush to brush your teeth and be gentle when you floss.  A sexual relationship may be continued unless your caregiver directs you otherwise.  Do not travel far distances unless it is absolutely necessary and only with the approval of your caregiver.  Take prenatal classes to understand, practice, and ask questions about the labor and delivery.  Make a trial run to the hospital.  Pack your hospital bag.  Prepare the baby's nursery.  Continue to go to all your prenatal visits as directed by your caregiver. SEEK MEDICAL CARE IF:  You are unsure if you are in labor or if your water has broken.  You have dizziness.  You have   mild pelvic cramps, pelvic pressure, or nagging pain in your abdominal area.  You have persistent nausea, vomiting, or diarrhea.  You have a bad smelling vaginal discharge.  You have pain with urination. SEEK IMMEDIATE MEDICAL CARE IF:   You have a fever.  You are leaking fluid from your vagina.  You have spotting or bleeding from your vagina.  You have severe abdominal cramping or pain.  You have rapid weight loss or gain.  You have shortness of breath with chest pain.  You notice sudden or extreme swelling of your face, hands, ankles, feet, or legs.  You have not felt your baby move in over an hour.  You have severe headaches that do not go away with medicine.  You have vision changes.   This information is not intended to replace advice given to you by your health care provider. Make sure you discuss any questions you have with your health care provider.   Document Released:  03/27/2001 Document Revised: 04/23/2014 Document Reviewed: 06/03/2012 Elsevier Interactive Patient Education 2016 [employer]  Breastfeeding Deciding to breastfeed is one of the best choices you can make for you and your baby. A change in hormones during pregnancy causes your breast tissue to grow and increases the number and size of your milk ducts. These hormones also allow proteins, sugars, and fats from your blood supply to make breast milk in your milk-producing glands. Hormones prevent breast milk from being released before your baby is born as well as prompt milk flow after birth. Once breastfeeding has begun, thoughts of your baby, as well as his or her sucking or crying, can stimulate the release of milk from your milk-producing glands.  BENEFITS OF BREASTFEEDING For Your Baby  Your first milk (colostrum) helps your baby's digestive system function better.  There are antibodies in your milk that help your baby fight off infections.  Your baby has a lower incidence of asthma, allergies, and sudden infant death syndrome.  The nutrients in breast milk are better for your baby than infant formulas and are designed uniquely for your baby's needs.  Breast milk improves your baby's brain development.  Your baby is less likely to develop other conditions, such as childhood obesity, asthma, or type 2 diabetes mellitus. For You  Breastfeeding helps to create a very special bond between you and your baby.  Breastfeeding is convenient. Breast milk is always available at the correct temperature and costs nothing.  Breastfeeding helps to burn calories and helps you lose the weight gained during pregnancy.  Breastfeeding makes your uterus contract to its prepregnancy size faster and slows bleeding (lochia) after you give birth.   Breastfeeding helps to lower your risk of developing type 2 diabetes mellitus, osteoporosis, and breast or ovarian cancer later in life. SIGNS THAT YOUR BABY IS  HUNGRY Early Signs of Hunger  Increased alertness or activity.  Stretching.  Movement of the head from side to side.  Movement of the head and opening of the mouth when the corner of the mouth or cheek is stroked (rooting).  Increased sucking sounds, smacking lips, cooing, sighing, or squeaking.  Hand-to-mouth movements.  Increased sucking of fingers or hands. Late Signs of Hunger  Fussing.  Intermittent crying. Extreme Signs of Hunger Signs of extreme hunger will require calming and consoling before your baby will be able to breastfeed successfully. Do not wait for the following signs of extreme hunger to occur before you initiate breastfeeding:  Restlessness.  A loud, strong cry.  Screaming.   BREASTFEEDING BASICS Breastfeeding Initiation  Find a comfortable place to sit or lie down, with your neck and back well supported.  Place a pillow or rolled up blanket under your baby to bring him or her to the level of your breast (if you are seated). Nursing pillows are specially designed to help support your arms and your baby while you breastfeed.  Make sure that your baby's abdomen is facing your abdomen.  Gently massage your breast. With your fingertips, massage from your chest wall toward your nipple in a circular motion. This encourages milk flow. You may need to continue this action during the feeding if your milk flows slowly.  Support your breast with 4 fingers underneath and your thumb above your nipple. Make sure your fingers are well away from your nipple and your baby's mouth.  Stroke your baby's lips gently with your finger or nipple.  When your baby's mouth is open wide enough, quickly bring your baby to your breast, placing your entire nipple and as much of the colored area around your nipple (areola) as possible into your baby's mouth.  More areola should be visible above your baby's upper lip than below the lower lip.  Your baby's tongue should be between his  or her lower gum and your breast.  Ensure that your baby's mouth is correctly positioned around your nipple (latched). Your baby's lips should create a seal on your breast and be turned out (everted).  It is common for your baby to suck about 2-3 minutes in order to start the flow of breast milk. Latching Teaching your baby how to latch on to your breast properly is very important. An improper latch can cause nipple pain and decreased milk supply for you and poor weight gain in your baby. Also, if your baby is not latched onto your nipple properly, he or she may swallow some air during feeding. This can make your baby fussy. Burping your baby when you switch breasts during the feeding can help to get rid of the air. However, teaching your baby to latch on properly is still the best way to prevent fussiness from swallowing air while breastfeeding. Signs that your baby has successfully latched on to your nipple:  Silent tugging or silent sucking, without causing you pain.  Swallowing heard between every 3-4 sucks.  Muscle movement above and in front of his or her ears while sucking. Signs that your baby has not successfully latched on to nipple:  Sucking sounds or smacking sounds from your baby while breastfeeding.  Nipple pain. If you think your baby has not latched on correctly, slip your finger into the corner of your baby's mouth to break the suction and place it between your baby's gums. Attempt breastfeeding initiation again. Signs of Successful Breastfeeding Signs from your baby:  A gradual decrease in the number of sucks or complete cessation of sucking.  Falling asleep.  Relaxation of his or her body.  Retention of a small amount of milk in his or her mouth.  Letting go of your breast by himself or herself. Signs from you:  Breasts that have increased in firmness, weight, and size 1-3 hours after feeding.  Breasts that are softer immediately after  breastfeeding.  Increased milk volume, as well as a change in milk consistency and color by the fifth day of breastfeeding.  Nipples that are not sore, cracked, or bleeding. Signs That Your Baby is Getting Enough Milk  Wetting at least 3 diapers in a 24-hour period.   The urine should be clear and pale yellow by age 5 days.  At least 3 stools in a 24-hour period by age 5 days. The stool should be soft and yellow.  At least 3 stools in a 24-hour period by age 7 days. The stool should be seedy and yellow.  No loss of weight greater than 10% of birth weight during the first 3 days of age.  Average weight gain of 4-7 ounces (113-198 g) per week after age 4 days.  Consistent daily weight gain by age 5 days, without weight loss after the age of 2 weeks. After a feeding, your baby may spit up a small amount. This is common. BREASTFEEDING FREQUENCY AND DURATION Frequent feeding will help you make more milk and can prevent sore nipples and breast engorgement. Breastfeed when you feel the need to reduce the fullness of your breasts or when your baby shows signs of hunger. This is called "breastfeeding on demand." Avoid introducing a pacifier to your baby while you are working to establish breastfeeding (the first 4-6 weeks after your baby is born). After this time you may choose to use a pacifier. Research has shown that pacifier use during the first year of a baby's life decreases the risk of sudden infant death syndrome (SIDS). Allow your baby to feed on each breast as long as he or she wants. Breastfeed until your baby is finished feeding. When your baby unlatches or falls asleep while feeding from the first breast, offer the second breast. Because newborns are often sleepy in the first few weeks of life, you may need to awaken your baby to get him or her to feed. Breastfeeding times will vary from baby to baby. However, the following rules can serve as a guide to help you ensure that your baby is  properly fed:  Newborns (babies 4 weeks of age or younger) may breastfeed every 1-3 hours.  Newborns should not go longer than 3 hours during the day or 5 hours during the night without breastfeeding.  You should breastfeed your baby a minimum of 8 times in a 24-hour period until you begin to introduce solid foods to your baby at around 6 months of age. BREAST MILK PUMPING Pumping and storing breast milk allows you to ensure that your baby is exclusively fed your breast milk, even at times when you are unable to breastfeed. This is especially important if you are going back to work while you are still breastfeeding or when you are not able to be present during feedings. Your lactation consultant can give you guidelines on how long it is safe to store breast milk. A breast pump is a machine that allows you to pump milk from your breast into a sterile bottle. The pumped breast milk can then be stored in a refrigerator or freezer. Some breast pumps are operated by hand, while others use electricity. Ask your lactation consultant which type will work best for you. Breast pumps can be purchased, but some hospitals and breastfeeding support groups lease breast pumps on a monthly basis. A lactation consultant can teach you how to hand express breast milk, if you prefer not to use a pump. CARING FOR YOUR BREASTS WHILE YOU BREASTFEED Nipples can become dry, cracked, and sore while breastfeeding. The following recommendations can help keep your breasts moisturized and healthy:  Avoid using soap on your nipples.  Wear a supportive bra. Although not required, special nursing bras and tank tops are designed to allow access to your   breasts for breastfeeding without taking off your entire bra or top. Avoid wearing underwire-style bras or extremely tight bras.  Air dry your nipples for 3-4minutes after each feeding.  Use only cotton bra pads to absorb leaked breast milk. Leaking of breast milk between feedings  is normal.  Use lanolin on your nipples after breastfeeding. Lanolin helps to maintain your skin's normal moisture barrier. If you use pure lanolin, you do not need to wash it off before feeding your baby again. Pure lanolin is not toxic to your baby. You may also hand express a few drops of breast milk and gently massage that milk into your nipples and allow the milk to air dry. In the first few weeks after giving birth, some women experience extremely full breasts (engorgement). Engorgement can make your breasts feel heavy, warm, and tender to the touch. Engorgement peaks within 3-5 days after you give birth. The following recommendations can help ease engorgement:  Completely empty your breasts while breastfeeding or pumping. You may want to start by applying warm, moist heat (in the shower or with warm water-soaked hand towels) just before feeding or pumping. This increases circulation and helps the milk flow. If your baby does not completely empty your breasts while breastfeeding, pump any extra milk after he or she is finished.  Wear a snug bra (nursing or regular) or tank top for 1-2 days to signal your body to slightly decrease milk production.  Apply ice packs to your breasts, unless this is too uncomfortable for you.  Make sure that your baby is latched on and positioned properly while breastfeeding. If engorgement persists after 48 hours of following these recommendations, contact your health care provider or a lactation consultant. OVERALL HEALTH CARE RECOMMENDATIONS WHILE BREASTFEEDING  Eat healthy foods. Alternate between meals and snacks, eating 3 of each per day. Because what you eat affects your breast milk, some of the foods may make your baby more irritable than usual. Avoid eating these foods if you are sure that they are negatively affecting your baby.  Drink milk, fruit juice, and water to satisfy your thirst (about 10 glasses a day).  Rest often, relax, and continue to take  your prenatal vitamins to prevent fatigue, stress, and anemia.  Continue breast self-awareness checks.  Avoid chewing and smoking tobacco. Chemicals from cigarettes that pass into breast milk and exposure to secondhand smoke may harm your baby.  Avoid alcohol and drug use, including marijuana. Some medicines that may be harmful to your baby can pass through breast milk. It is important to ask your health care provider before taking any medicine, including all over-the-counter and prescription medicine as well as vitamin and herbal supplements. It is possible to become pregnant while breastfeeding. If birth control is desired, ask your health care provider about options that will be safe for your baby. SEEK MEDICAL CARE IF:  You feel like you want to stop breastfeeding or have become frustrated with breastfeeding.  You have painful breasts or nipples.  Your nipples are cracked or bleeding.  Your breasts are red, tender, or warm.  You have a swollen area on either breast.  You have a fever or chills.  You have nausea or vomiting.  You have drainage other than breast milk from your nipples.  Your breasts do not become full before feedings by the fifth day after you give birth.  You feel sad and depressed.  Your baby is too sleepy to eat well.  Your baby is having trouble sleeping.     Your baby is wetting less than 3 diapers in a 24-hour period.  Your baby has less than 3 stools in a 24-hour period.  Your baby's skin or the white part of his or her eyes becomes yellow.   Your baby is not gaining weight by 5 days of age. SEEK IMMEDIATE MEDICAL CARE IF:  Your baby is overly tired (lethargic) and does not want to wake up and feed.  Your baby develops an unexplained fever.   This information is not intended to replace advice given to you by your health care provider. Make sure you discuss any questions you have with your health care provider.   Document Released: 04/02/2005  Document Revised: 12/22/2014 Document Reviewed: 09/24/2012 Elsevier Interactive Patient Education 2016 [employer] Inc.  

## 2015-10-11 ENCOUNTER — Inpatient Hospital Stay (HOSPITAL_COMMUNITY): Payer: 59 | Admitting: Anesthesiology

## 2015-10-11 ENCOUNTER — Inpatient Hospital Stay (HOSPITAL_COMMUNITY)
Admission: AD | Admit: 2015-10-11 | Discharge: 2015-10-14 | DRG: 775 | Disposition: A | Discharge status: Home / Self Care | Payer: 59 | Source: Ambulatory Visit | Attending: Family Medicine | Admitting: Family Medicine

## 2015-10-11 ENCOUNTER — Encounter (HOSPITAL_COMMUNITY): Payer: Self-pay | Admitting: *Deleted

## 2015-10-11 DIAGNOSIS — Z3A40 40 weeks gestation of pregnancy: Secondary | ICD-10-CM | POA: Diagnosis not present

## 2015-10-11 DIAGNOSIS — Z3403 Encounter for supervision of normal first pregnancy, third trimester: Secondary | ICD-10-CM

## 2015-10-11 DIAGNOSIS — Z3A39 39 weeks gestation of pregnancy: Secondary | ICD-10-CM

## 2015-10-11 DIAGNOSIS — IMO0001 Reserved for inherently not codable concepts without codable children: Secondary | ICD-10-CM

## 2015-10-11 DIAGNOSIS — O99824 Streptococcus B carrier state complicating childbirth: Principal | ICD-10-CM | POA: Diagnosis present

## 2015-10-11 DIAGNOSIS — J4520 Mild intermittent asthma, uncomplicated: Secondary | ICD-10-CM | POA: Diagnosis present

## 2015-10-11 DIAGNOSIS — O99820 Streptococcus B carrier state complicating pregnancy: Secondary | ICD-10-CM

## 2015-10-11 LAB — CBC
HEMATOCRIT: 31.8 % — AB (ref 36.0–46.0)
HEMOGLOBIN: 10.4 g/dL — AB (ref 12.0–15.0)
MCH: 27.9 pg (ref 26.0–34.0)
MCHC: 32.7 g/dL (ref 30.0–36.0)
MCV: 85.3 fL (ref 78.0–100.0)
PLATELETS: 168 10*3/uL (ref 150–400)
RBC: 3.73 MIL/uL — AB (ref 3.87–5.11)
RDW: 13.8 % (ref 11.5–15.5)
WBC: 13.4 10*3/uL — AB (ref 4.0–10.5)

## 2015-10-11 LAB — TYPE AND SCREEN
ABO/RH(D): O POS
Antibody Screen: NEGATIVE

## 2015-10-11 LAB — RPR: RPR: NONREACTIVE

## 2015-10-11 MED ORDER — DIPHENHYDRAMINE HCL 50 MG/ML IJ SOLN: 12.5000 mg | INTRAMUSCULAR | Status: DC | PRN

## 2015-10-11 MED ORDER — EPHEDRINE 5 MG/ML INJ
10.0000 mg | INTRAVENOUS | Status: DC | PRN
  Filled 2015-10-11: qty 2

## 2015-10-11 MED ORDER — FENTANYL CITRATE (PF) 100 MCG/2ML IJ SOLN: 50.0000 ug | INTRAMUSCULAR | Status: DC | PRN

## 2015-10-11 MED ORDER — TERBUTALINE SULFATE 1 MG/ML IJ SOLN
0.2500 mg | Freq: Once | INTRAMUSCULAR | Status: DC | PRN
  Filled 2015-10-11: qty 1

## 2015-10-11 MED ORDER — FENTANYL 2.5 MCG/ML BUPIVACAINE 1/10 % EPIDURAL INFUSION (WH - ANES)
INTRAMUSCULAR | Status: AC
  Filled 2015-10-11: qty 125

## 2015-10-11 MED ORDER — OXYTOCIN 40 UNITS IN LACTATED RINGERS INFUSION - SIMPLE MED
1.0000 m[IU]/min | INTRAVENOUS | Status: DC
  Administered 2015-10-11: 2 m[IU]/min via INTRAVENOUS
  Filled 2015-10-11: qty 1000

## 2015-10-11 MED ORDER — PENICILLIN G POTASSIUM 5000000 UNITS IJ SOLR
2.5000 10*6.[IU] | INTRAVENOUS | Status: DC
  Administered 2015-10-11 (×5): 2.5 10*6.[IU] via INTRAVENOUS
  Filled 2015-10-11 (×12): qty 2.5

## 2015-10-11 MED ORDER — DEXTROSE 5 % IV SOLN
5.0000 10*6.[IU] | Freq: Once | INTRAVENOUS | Status: AC
  Administered 2015-10-11: 5 10*6.[IU] via INTRAVENOUS
  Filled 2015-10-11: qty 5

## 2015-10-11 MED ORDER — LACTATED RINGERS IV SOLN: 500.0000 mL | INTRAVENOUS | Status: DC | PRN

## 2015-10-11 MED ORDER — ACETAMINOPHEN 325 MG PO TABS: 650.0000 mg | ORAL_TABLET | ORAL | Status: DC | PRN

## 2015-10-11 MED ORDER — LIDOCAINE HCL (PF) 1 % IJ SOLN
INTRAMUSCULAR | Status: DC | PRN
  Administered 2015-10-11 (×2): 6 mL

## 2015-10-11 MED ORDER — PHENYLEPHRINE 40 MCG/ML (10ML) SYRINGE FOR IV PUSH (FOR BLOOD PRESSURE SUPPORT)
PREFILLED_SYRINGE | INTRAVENOUS | Status: AC
  Filled 2015-10-11: qty 20

## 2015-10-11 MED ORDER — FENTANYL 2.5 MCG/ML BUPIVACAINE 1/10 % EPIDURAL INFUSION (WH - ANES)
14.0000 mL/h | INTRAMUSCULAR | Status: DC | PRN
  Administered 2015-10-11 (×3): 14 mL/h via EPIDURAL
  Filled 2015-10-11 (×2): qty 125

## 2015-10-11 MED ORDER — ALBUTEROL SULFATE (2.5 MG/3ML) 0.083% IN NEBU: 3.0000 mL | INHALATION_SOLUTION | RESPIRATORY_TRACT | Status: DC | PRN

## 2015-10-11 MED ORDER — LACTATED RINGERS IV SOLN: 500.0000 mL | Freq: Once | INTRAVENOUS | Status: DC

## 2015-10-11 MED ORDER — ONDANSETRON HCL 4 MG/2ML IJ SOLN
4.0000 mg | Freq: Four times a day (QID) | INTRAMUSCULAR | Status: DC | PRN
  Administered 2015-10-11: 4 mg via INTRAVENOUS
  Filled 2015-10-11: qty 2

## 2015-10-11 MED ORDER — PHENYLEPHRINE 40 MCG/ML (10ML) SYRINGE FOR IV PUSH (FOR BLOOD PRESSURE SUPPORT)
80.0000 ug | PREFILLED_SYRINGE | INTRAVENOUS | Status: DC | PRN
  Filled 2015-10-11: qty 5

## 2015-10-11 MED ORDER — FLEET ENEMA 7-19 GM/118ML RE ENEM: 1.0000 | ENEMA | RECTAL | Status: DC | PRN

## 2015-10-11 MED ORDER — LACTATED RINGERS IV SOLN
INTRAVENOUS | Status: DC
  Administered 2015-10-11 (×3): via INTRAVENOUS

## 2015-10-11 MED ORDER — SOD CITRATE-CITRIC ACID 500-334 MG/5ML PO SOLN
30.0000 mL | ORAL | Status: DC | PRN
  Administered 2015-10-11 (×2): 30 mL via ORAL
  Filled 2015-10-11 (×2): qty 15

## 2015-10-11 MED ORDER — OXYTOCIN BOLUS FROM INFUSION
500.0000 mL | INTRAVENOUS | Status: DC
  Administered 2015-10-12: 500 mL via INTRAVENOUS

## 2015-10-11 MED ORDER — OXYTOCIN 40 UNITS IN LACTATED RINGERS INFUSION - SIMPLE MED
2.5000 [IU]/h | INTRAVENOUS | Status: DC
  Administered 2015-10-12: 2.5 [IU]/h via INTRAVENOUS

## 2015-10-11 MED ORDER — LIDOCAINE HCL (PF) 1 % IJ SOLN
30.0000 mL | INTRAMUSCULAR | Status: DC | PRN
  Filled 2015-10-11: qty 30

## 2015-10-11 MED ORDER — LACTATED RINGERS IV SOLN
500.0000 mL | Freq: Once | INTRAVENOUS | Status: AC
  Administered 2015-10-11: 500 mL via INTRAVENOUS

## 2015-10-11 NOTE — MAU Note (Signed)
PT  SAYS HURT  BAD   AT 7PM.  [facility]-  AT  [facility].    VE IN OFFICE -    DR  PRATT   2-3  CM.     DENIES   HSV AND MRSA.   GBS- POSITIVE.

## 2015-10-11 NOTE — Progress Notes (Signed)
LABOR PROGRESS NOTE  Elizabeth Williamson is a 25 y.o. G3P0020 at 1665w6d  admitted for sol  Subjective: Mild pain  Objective: BP 115/67 mmHg  Pulse 95  Temp(Src) 97.9 F (36.6 C) (Oral)  Resp 16  Ht 5\' 6"  (1.676 m)  Wt 196 lb 8 oz (89.132 kg)  BMI 31.73 kg/m2  SpO2 98%  LMP 01/05/2015 or  Filed Vitals:   10/11/15 1504 10/11/15 1531 10/11/15 1601 10/11/15 1631  BP: 129/79 103/86 108/83 115/67  Pulse: 101 96 100 95  Temp:      TempSrc:      Resp: 18 16 16 16   Height:      Weight:      SpO2:        145/mod/+a/-d  Dilation: 6 Effacement (%): 90 Cervical Position: Middle Station: -2, -1 Presentation: Vertex Exam by:: Gifford ShaveYancey Luft RN   Labs: Lab Results  Component Value Date   WBC 13.4* 10/11/2015   HGB 10.4* 10/11/2015   HCT 31.8* 10/11/2015   MCV 85.3 10/11/2015   PLT 168 10/11/2015    Patient Active Problem List   Diagnosis Date Noted  . Active labor 10/11/2015  . Group B Streptococcus carrier, antepartum 09/17/2015  . Asthma, mild intermittent 05/06/2015  . Allergic rhinitis 05/06/2015  . Supervision of normal first pregnancy 05/06/2015  . Panic attack 12/30/2013  . Gastroesophageal reflux disease without esophagitis 11/09/2013    Assessment / Plan: 25 y.o. G3P0020 at 4165w6d here for sol  Labor: protracted latent phase. On pitocin. Just now arom mec.  Fetal Wellbeing:  Cat 1, good scalp stim Pain Control:  S/p epidural Anticipated MOD:  vaginal  Silvano BilisNoah B Everleigh Colclasure, MD 10/11/2015, 5:07 PM

## 2015-10-11 NOTE — Progress Notes (Signed)
Labor Progress Note Elizabeth Williamson is a 25 y.o. G3P0020 at 6124w6d presented for SOL S: feels pressure but no pain. No other complaints  O:  BP 115/67 mmHg  Pulse 92  Temp(Src) 97.7 F (36.5 C) (Oral)  Resp 16  Ht 5\' 6"  (1.676 m)  Wt 196 lb 8 oz (89.132 kg)  BMI 31.73 kg/m2  SpO2 98%  LMP 01/05/2015 EFM: 140/mod var/no decels  CVE: Dilation: 4 Effacement (%): 90 Cervical Position: Middle Station: -2 Presentation: Vertex Exam by:: erin davis rn   A&P: 25 y.o. [id number] [id number] admitted for SOL #Labor: expectant. Contracting every 3 minutes #Pain: Epidural  #FWB: CAT-1 #GBS: PCN  Almon Herculesaye T Blondina Coderre, MD 6:42 AM

## 2015-10-11 NOTE — Anesthesia Procedure Notes (Signed)
Epidural Patient location during procedure: OB  Staffing Anesthesiologist: Sherrian DiversENENNY, Jervon Ream  Preanesthetic Checklist Completed: patient identified, site marked, surgical consent, pre-op evaluation, timeout performed, IV checked, risks and benefits discussed and monitors and equipment checked  Epidural Patient position: sitting Prep: DuraPrep Patient monitoring: heart rate and blood pressure Approach: midline Location: L3-L4 Injection technique: LOR saline  Needle:  Needle type: Tuohy  Needle gauge: 17 G Needle length: 9 cm Needle insertion depth: 5 cm Catheter type: closed end flexible Catheter size: 19 Gauge Catheter at skin depth: 12 cm Test dose: negative and Other  Assessment Events: blood not aspirated, injection not painful, no injection resistance, negative IV test and no paresthesia  Additional Notes Reason for block:procedure for pain

## 2015-10-11 NOTE — Anesthesia Preprocedure Evaluation (Addendum)
Anesthesia Evaluation  Patient identified by MRN, date of birth, ID band Patient awake    Reviewed: Allergy & Precautions, NPO status , Patient's Chart, lab work & pertinent test results  Airway Mallampati: II  TM Distance: >3 FB Neck ROM: Full    Dental no notable dental hx.    Pulmonary asthma , former smoker,    Pulmonary exam normal breath sounds clear to auscultation       Cardiovascular negative cardio ROS Normal cardiovascular exam Rhythm:Regular Rate:Normal     Neuro/Psych  Headaches, Anxiety    GI/Hepatic Neg liver ROS, GERD  ,  Endo/Other  negative endocrine ROS  Renal/GU negative Renal ROS  negative genitourinary   Musculoskeletal negative musculoskeletal ROS (+)   Abdominal (+) + obese,   Peds negative pediatric ROS (+)  Hematology negative hematology ROS (+)   Anesthesia Other Findings   Reproductive/Obstetrics negative OB ROS                             Anesthesia Physical Anesthesia Plan  ASA: II  Anesthesia Plan: Epidural   Post-op Pain Management:    Induction: Intravenous  Airway Management Planned: Natural Airway  Additional Equipment:   Intra-op Plan:   Post-operative Plan:   Informed Consent: I have reviewed the patients History and Physical, chart, labs and discussed the procedure including the risks, benefits and alternatives for the proposed anesthesia with the patient or authorized representative who has indicated his/her understanding and acceptance.   Dental advisory given  Plan Discussed with: CRNA  Anesthesia Plan Comments: (Informed consent obtained prior to proceeding including risk of failure, 1% risk of PDPH, risk of minor discomfort and bruising.  Discussed rare but serious complications including epidural abscess, permanent nerve injury, epidural hematoma.  Discussed alternatives to epidural analgesia and patient desires to proceed.   Timeout performed pre-procedure verifying patient name, procedure, and platelet count.  Patient tolerated procedure well. )        Anesthesia Quick Evaluation

## 2015-10-11 NOTE — Progress Notes (Addendum)
LABOR PROGRESS NOTE  Elizabeth Williamson is a 25 y.o. G3P0020 at 3560w6d  admitted for SOL.  Subjective: Pt is comfortable, epidural in place.  No pressure sensation at this time  Objective: BP 123/79 mmHg  Pulse 102  Temp(Src) 98 F (36.7 C) (Oral)  Resp 16  Ht 5\' 6"  (1.676 m)  Wt 196 lb 8 oz (89.132 kg)  BMI 31.73 kg/m2  SpO2 98%  LMP 01/05/2015 or  Filed Vitals:   10/11/15 0831 10/11/15 0901 10/11/15 0931 10/11/15 1001  BP: 99/60 123/77 118/80 123/79  Pulse: 89 88 103 102  Temp:      TempSrc:      Resp: 16 16 16 16   Height:      Weight:      SpO2:        Dilation: 5.5 Effacement (%): 90 Cervical Position: Middle Station: -2, -1 Presentation: Vertex Exam by:: Gifford ShaveYancey Luft RN  Labs: Lab Results  Component Value Date   WBC 13.4* 10/11/2015   HGB 10.4* 10/11/2015   HCT 31.8* 10/11/2015   MCV 85.3 10/11/2015   PLT 168 10/11/2015    Patient Active Problem List   Diagnosis Date Noted  . Active labor 10/11/2015  . Group B Streptococcus carrier, antepartum 09/17/2015  . Asthma, mild intermittent 05/06/2015  . Allergic rhinitis 05/06/2015  . Supervision of normal first pregnancy 05/06/2015  . Panic attack 12/30/2013  . Gastroesophageal reflux disease without esophagitis 11/09/2013    Assessment / Plan: 25 y.o. G3P0020 at 4060w6d here for SOL  Labor: Latent, station remains elevated would hold off AROM, consider pitocin to augment labor Fetal Wellbeing:  Cat-1 Pain Control:  Epidural Anticipated MOD:  NSVD  Olena LeatherwoodKelly M Donis Kotowski, MD 10/11/2015, 10:17 AM

## 2015-10-11 NOTE — H&P (Signed)
LABOR ADMISSION HISTORY AND PHYSICAL  Elizabeth Williamson is a 25 y.o. female G3P0020 with IUP at 2550w6d by 7w US presenting for contraction . She reports regular contraction every 3-5 minutes since 7 pm on 6/26. Fetal movement present and at baseline. Denies fluid leak or gush, vaginal bleeding,  headaches, blurry vision, RUQ pain or peripheral edema. She plans on breast feeding. She request POP for birth control, but need more information about LARC.  Plans on bringing child to [place] p[facility] (Dr. Hyacinth MeekerMiller) for pediatric care after discharge.  Undecided about pain options yet.   Dating: By Alba Cory7w US --->  Estimated Date of Delivery: 10/12/15  Prenatal History/Complications: [facility] Prenatal Labs  Dating LMP + 7 wk U/S Blood type: O/Positive/-- (11/09 0000)   Genetic Screen Quad:  WNL    Antibody:Negative (11/09 0000)  Anatomic US  normal Rubella: Immune (11/09 0000)  GTT Third trimester: 131 RPR: NON REAC (04/18 1135) Non-reactive  Flu vaccine 01/27/15 HBsAg: Negative (11/09 0000)   TDaP vaccine  08/02/15                                          HIV: NONREACTIVE (04/18 1135)   Baby Food  Breast                                             GBS: positive   Contraception  Undecided Pap: Normal at her PCP in 06/2014  Circumcision  Yes   [facility] Dr. Hyacinth MeekerMiller   Support Person  Husband - Joselyn Glassmanyler    Past Medical History: Past Medical History  Diagnosis Date  . Migraine     rare  . Alopecia     got injections through dermatologist    Past Surgical History: Past Surgical History  Procedure Laterality Date  . No past surgeries      Obstetrical History: OB History    Gravida Para Term Preterm AB TAB SAB Ectopic Multiple Living   3    2 1 1          Social History: Social History   Social History  . Marital Status: Married    Spouse Name: N/A  . Number of Children: N/A  . Years of Education: N/A   Occupational History   . nurse tech at [employer]    Social History Main Topics  . Smoking status: Former Smoker -- 0.50 packs/day for 3 years    Types: Cigarettes    Quit date: 08/14/2013  . Smokeless tobacco: Never Used     Comment: increased to 3 pack/week  . Alcohol Use: 0.0 oz/week    0 Standard drinks or equivalent per week     Comment: Occasional social drinker, every other weekend  . Drug Use: No  . Sexual Activity:    Partners: Male    Birth Control/ Protection: None   Other Topics Concern  . 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 No pets.  No passive tobacco exposure.     Family History: Family History  Problem Relation Age of Onset  . Heart disease Brother  recently found to have enlarged heart (age 27)  . Diabetes Maternal Grandmother   . Cancer Maternal Grandfather     colon cancer (60's)  . Hypertension Paternal Grandmother   . Cancer Paternal Uncle     lung cancer (nonsmoker)    Allergies: No Known Allergies  Prescriptions prior to admission  Medication Sig Dispense Refill Last Dose  . albuterol (PROAIR HFA) 108 (90 BASE) MCG/ACT inhaler Inhale 1-2 puffs into the lungs every 4 (four) hours as needed. 18 g 1 Taking  . Prenatal Vit-Fe Fumarate-FA (PRENATAL VITAMIN PO) Take 1 tablet by mouth daily.   Taking     Review of Systems  Blood pressure 116/71, pulse 103, temperature 98.2 F (36.8 C), temperature source Oral, resp. rate 20, height 5\' 6"  (1.676 m), weight 196 lb 8 oz (89.132 kg), last menstrual period 01/05/2015. GEN: appearance: alert, cooperative and appears stated age. Some discomfort from pain RESP: clear to auscultation bilaterally, no increased WOB CVS:: regular rate and rhythm, no murmurs, no sign of DVT, +2 DP GI: soft, non-tender; bowel sounds normal MSK: WWP, Homans sign is negative,   Pelvic Exam: Cervical exam: Dilation:  4.5 Effacement (%): 80 Station: -1 Exam by:: Dr. Alanda SlimGonfa  Presentation: cephalic Uterine activityDate/time of onset: 6/26, Frequency: Every 3 minutes  Fetal monitoringBaseline: 150 bpm, Variability: Good {> 6 bpm), Accelerations: Reactive and Decelerations: Absent  Prenatal labs: ABO, Rh: O/Positive/-- (11/09 0000) Antibody: Negative (11/09 0000) Rubella: immune RPR: NON REAC (04/18 1135)  HBsAg: Negative (11/09 0000)  HIV: NONREACTIVE (04/18 1135)  GBS:    1 hr Glucola negative Genetic screening  Quad within normal Anatomy US normal  Prenatal Transfer Tool  Maternal Diabetes: No Genetic Screening: Normal Maternal Ultrasounds/Referrals: Normal Fetal Ultrasounds or other Referrals:  None Maternal Substance Abuse:  No Significant Maternal Medications:  None Significant Maternal Lab Results: Lab values include: Group B Strep positive  No results found for this or any previous visit (from the past 24 hour(s)).  Patient Active Problem List   Diagnosis Date Noted  . Group B Streptococcus carrier, antepartum 09/17/2015  . Asthma, mild intermittent 05/06/2015  . Allergic rhinitis 05/06/2015  . Supervision of normal first pregnancy 05/06/2015  . Panic attack 12/30/2013  . Gastroesophageal reflux disease without esophagitis 11/09/2013    Assessment: Elizabeth Williamson is a 25 y.o. G3P0020 at 1516w6d admitted for SOL  #Labor: contracting regularly. Continue expectant #Pain: hasn't decided yet. Discussed about options.  #FWB: CAT-1 #ID: GBS pos: PCN #MOF: Breast #MOC: POP. Will discuss bout LARC again #Circ: inpatient.   Almon Herculesaye T Gonfa 10/11/2015, 2:11 AM  OB fellow attestation: I have seen and examined this patient; I agree with above documentation in the resident's note.   Elizabeth Williamson is a 25 y.o. G3P0020 here for active labor  PE: BP 115/67 mmHg  Pulse 92  Temp(Src) 97.7 F (36.5 C) (Oral)  Resp 16  Ht 5\' 6"  (1.676 m)  Wt 196 lb 8 oz (89.132 kg)  BMI 31.73  kg/m2  SpO2 98%  LMP 01/05/2015 Gen: calm comfortable, NAD Resp: normal effort, no distress Abd: gravid  ROS, labs, PMH reviewed  Plan: Admit to LD Labor:expectant managment FWB:Cat I ID: GBS pos, start PCN  Federico FlakeKimberly Niles Nahjae Hoeg, MD  Family Medicine, OB Fellow 10/11/2015, 6:05 AM

## 2015-10-11 NOTE — Progress Notes (Signed)
LABOR PROGRESS NOTE  Elizabeth Williamson is a 25 y.o. G3P0020 at 7225w6d  admitted for SOL  Subjective: Pt has epidural, good anesthesia.    Objective: BP 116/76 mmHg  Pulse 89  Temp(Src) 97.9 F (36.6 C) (Oral)  Resp 16  Ht 5\' 6"  (1.676 m)  Wt 196 lb 8 oz (89.132 kg)  BMI 31.73 kg/m2  SpO2 98%  LMP 01/05/2015 or  Filed Vitals:   10/11/15 1301 10/11/15 1331 10/11/15 1401 10/11/15 1431  BP: 113/75 114/76 114/73 116/76  Pulse: 95 96 95 89  Temp:    97.9 F (36.6 C)  TempSrc:    Oral  Resp: 18 18 16 16   Height:      Weight:      SpO2:        Dilation: 6 Effacement (%): 90 Cervical Position: Middle Station: -2, -1 Presentation: Vertex Exam by:: Gifford ShaveYancey Luft RN   Labs: Lab Results  Component Value Date   WBC 13.4* 10/11/2015   HGB 10.4* 10/11/2015   HCT 31.8* 10/11/2015   MCV 85.3 10/11/2015   PLT 168 10/11/2015    Patient Active Problem List   Diagnosis Date Noted  . Active labor 10/11/2015  . Group B Streptococcus carrier, antepartum 09/17/2015  . Asthma, mild intermittent 05/06/2015  . Allergic rhinitis 05/06/2015  . Supervision of normal first pregnancy 05/06/2015  . Panic attack 12/30/2013  . Gastroesophageal reflux disease without esophagitis 11/09/2013    Assessment / Plan: 25 y.o. G3P0020 at 4925w6d here for SOL  Labor: Latent, progressing well on pitocin with good contraction pattern Fetal Wellbeing:  Cat-I Pain Control:  Epidural Anticipated MOD:  NSVD  Olena LeatherwoodKelly M Armarion Greek, MD 10/11/2015, 2:57 PM

## 2015-10-12 ENCOUNTER — Encounter (HOSPITAL_COMMUNITY): Payer: Self-pay | Admitting: *Deleted

## 2015-10-12 DIAGNOSIS — O99824 Streptococcus B carrier state complicating childbirth: Secondary | ICD-10-CM

## 2015-10-12 DIAGNOSIS — Z3A39 39 weeks gestation of pregnancy: Secondary | ICD-10-CM

## 2015-10-12 MED ORDER — TETANUS-DIPHTH-ACELL PERTUSSIS 5-2.5-18.5 LF-MCG/0.5 IM SUSP: 0.5000 mL | Freq: Once | INTRAMUSCULAR | Status: DC

## 2015-10-12 MED ORDER — ONDANSETRON HCL 4 MG PO TABS: 4.0000 mg | ORAL_TABLET | ORAL | Status: DC | PRN

## 2015-10-12 MED ORDER — IBUPROFEN 600 MG PO TABS
600.0000 mg | ORAL_TABLET | Freq: Four times a day (QID) | ORAL | Status: DC
  Administered 2015-10-12 – 2015-10-14 (×7): 600 mg via ORAL
  Filled 2015-10-12 (×9): qty 1

## 2015-10-12 MED ORDER — BENZOCAINE-MENTHOL 20-0.5 % EX AERO
1.0000 | INHALATION_SPRAY | CUTANEOUS | Status: DC | PRN
Start: 2015-10-12 — End: 2015-10-14

## 2015-10-12 MED ORDER — SENNOSIDES-DOCUSATE SODIUM 8.6-50 MG PO TABS
2.0000 | ORAL_TABLET | ORAL | Status: DC
  Administered 2015-10-12 – 2015-10-13 (×2): 2 via ORAL
  Filled 2015-10-12 (×2): qty 2

## 2015-10-12 MED ORDER — ONDANSETRON HCL 4 MG/2ML IJ SOLN: 4.0000 mg | INTRAMUSCULAR | Status: DC | PRN

## 2015-10-12 MED ORDER — WITCH HAZEL-GLYCERIN EX PADS: 1.0000 "application " | MEDICATED_PAD | CUTANEOUS | Status: DC | PRN

## 2015-10-12 MED ORDER — COCONUT OIL OIL
1.0000 "application " | TOPICAL_OIL | Status: DC | PRN
  Filled 2015-10-12: qty 120

## 2015-10-12 MED ORDER — MEASLES, MUMPS & RUBELLA VAC ~~LOC~~ INJ
0.5000 mL | INJECTION | Freq: Once | SUBCUTANEOUS | Status: DC
  Filled 2015-10-12: qty 0.5

## 2015-10-12 MED ORDER — DIPHENHYDRAMINE HCL 25 MG PO CAPS
25.0000 mg | ORAL_CAPSULE | Freq: Four times a day (QID) | ORAL | Status: DC | PRN
  Administered 2015-10-14: 25 mg via ORAL
  Filled 2015-10-12: qty 1

## 2015-10-12 MED ORDER — ACETAMINOPHEN 325 MG PO TABS: 650.0000 mg | ORAL_TABLET | ORAL | Status: DC | PRN

## 2015-10-12 MED ORDER — PRENATAL MULTIVITAMIN CH
1.0000 | ORAL_TABLET | Freq: Every day | ORAL | Status: DC
  Administered 2015-10-13: 1 via ORAL
  Filled 2015-10-12 (×2): qty 1

## 2015-10-12 MED ORDER — ZOLPIDEM TARTRATE 5 MG PO TABS: 5.0000 mg | ORAL_TABLET | Freq: Every evening | ORAL | Status: DC | PRN

## 2015-10-12 MED ORDER — DIBUCAINE 1 % RE OINT: 1.0000 "application " | TOPICAL_OINTMENT | RECTAL | Status: DC | PRN

## 2015-10-12 MED ORDER — SIMETHICONE 80 MG PO CHEW: 80.0000 mg | CHEWABLE_TABLET | ORAL | Status: DC | PRN

## 2015-10-12 NOTE — Lactation Note (Signed)
This note was copied from a baby's chart. Lactation Consultation Note  Patient Name: Elizabeth Harvie HeckMelanie Winkel ZOXWR'UToday's Date: 10/12/2015 Reason for consult: Initial assessment  Visited with Mom, baby 8 hrs old.  First baby, one latch for 2 mins.  Mom is resting on her side, and several visitors in room.  Baby about to have hearing screen.  Encouraged her to call for assistance with latching.  Recommended she place baby skin to skin.  Talked about watching for cues.  Talked about manual breast expression and offering spoon of colostrum.   Brochure left with Mom and informed her about IP and OP lactation services available to her.    Consult Status Consult Status: Follow-up Date: 10/13/15 Follow-up type: In-patient    Judee ClaraSmith, Kiyra Slaubaugh E 10/12/2015, 11:37 AM

## 2015-10-12 NOTE — Anesthesia Postprocedure Evaluation (Signed)
Anesthesia Post Note  Patient: Fredda HammedMelanie C Spillane  Procedure(s) Performed: * No procedures listed *  Patient location during evaluation: Mother Baby Anesthesia Type: Epidural Level of consciousness: awake Pain management: pain level controlled Vital Signs Assessment: post-procedure vital signs reviewed and stable Respiratory status: spontaneous breathing Cardiovascular status: stable Postop Assessment: no headache, no backache, epidural receding and patient able to bend at knees Anesthetic complications: no     Last Vitals:  Filed Vitals:   10/12/15 0516 10/12/15 0622  BP: 113/67 112/68  Pulse: 109 100  Temp: 37 C 36.9 C  Resp: 18 18    Last Pain:  Filed Vitals:   10/12/15 0626  PainSc: 1    Pain Goal: Patients Stated Pain Goal: 1 (10/12/15 0550)               Edison PaceWILKERSON,Thorin Starner

## 2015-10-13 ENCOUNTER — Other Ambulatory Visit: Payer: 59

## 2015-10-13 MED ORDER — IBUPROFEN 600 MG PO TABS: 600.0000 mg | ORAL_TABLET | Freq: Four times a day (QID) | ORAL | Status: DC | PRN

## 2015-10-13 NOTE — Progress Notes (Signed)
LCSW has been consulted for Hx of Panic Attacks.  LCSW reviewed chart for patient.  Hx of panic attacks in 2015 with medication to treat and no recurring issues related throughout pregnancy or prenatal record.  LCSW will screen consult out at this time due to [facility], medication at time of treatment (2015 school related stressor) No documentation related to panic attacks nor psycho-social stressors. MOB is employed, good support with family.   LCSW is available if other needs arise. Will screen out at this time.  Deretha EmoryHannah Macintyre Alexa LCSW, MSW Clinical Social Work: System Insurance underwriterWide Float Coverage for [employer] N[facility] Clinical social worker [phone]

## 2015-10-13 NOTE — Discharge Instructions (Signed)

## 2015-10-13 NOTE — Discharge Summary (Signed)
OB Discharge Summary     Patient Name: Elizabeth Williamson DOB: [birth year] MRN: [id number]  Date of admission: 10/11/2015 Delivering MD: Shonna ChockWOUK, NOAH BEDFORD   Date of discharge: 10/13/2015  Admitting diagnosis: 40WKS,CTX Intrauterine pregnancy: 3458w0d     Secondary diagnosis:  Active Problems:   Active labor  Additional problems: none     Discharge diagnosis: Term Pregnancy Delivered                                                                                                Post partum procedures:none  Augmentation: AROM and Pitocin  Complications: None  Hospital course:  Onset of Labor With Vaginal Delivery     25 y.o. yo [id number] at 8058w0d was admitted in Latent Labor on 10/11/2015. Patient had an uncomplicated labor course as follows:  Membrane Rupture Time/Date: 5:04 PM ,10/11/2015   Intrapartum Procedures: Episiotomy: None [1]                                         Lacerations:  2nd degree [3];Perineal [11]  Patient had a delivery of a Viable infant. 10/12/2015  Information for the patient's newborn:  Caffie PintoWomble, Boy [place] [[id number]][[id number]]  Delivery Method: Vag-Spont    Pateint had an uncomplicated postpartum course.  She is ambulating, tolerating a regular diet, passing flatus, and urinating well. Patient is discharged home in stable condition on 10/13/2015.    Physical exam  Filed Vitals:   10/12/15 0622 10/12/15 1038 10/12/15 1720 10/13/15 0600  BP: 112/68 115/62 110/66 104/64  Pulse: 100 104 100 84  Temp: 98.5 F (36.9 C) 98.1 F (36.7 C) 98.3 F (36.8 C) 97.6 F (36.4 C)  TempSrc: Oral Oral Oral Oral  Resp: 18 18 18 17   Height:      Weight:      SpO2: 100% 100% 100%    General: alert and cooperative Lochia: appropriate Uterine Fundus: firm Incision: N/A DVT Evaluation: No evidence of DVT seen on physical exam. Labs: Lab Results  Component Value Date   WBC 13.4* 10/11/2015   HGB 10.4* 10/11/2015   HCT 31.8* 10/11/2015   MCV 85.3 10/11/2015   PLT 168  10/11/2015   CMP Latest Ref Rng 07/19/2014  Glucose 70 - 99 mg/dL 91  BUN 6 - 23 mg/dL 7  Creatinine 4.030.50 - 4.741.10 mg/dL 2.590.58  Sodium 563135 - 875145 mEq/L 141  Potassium 3.5 - 5.3 mEq/L 4.4  Chloride 96 - 112 mEq/L 108  CO2 19 - 32 mEq/L 25  Calcium 8.4 - 10.5 mg/dL 9.3  Total Protein 6.0 - 8.3 g/dL 7.1  Total Bilirubin 0.2 - 1.2 mg/dL 0.2  Alkaline Phos 39 - 117 U/L 70  AST 0 - 37 U/L 38(H)  ALT 0 - 35 U/L 34    Discharge instruction: per After Visit Summary and "Baby and Me Booklet".  After visit meds:    Medication List    TAKE these medications        albuterol 108 (90 Base) MCG/ACT  inhaler  Commonly known as:  PROAIR HFA  Inhale 1-2 puffs into the lungs every 4 (four) hours as needed.     ibuprofen 600 MG tablet  Commonly known as:  ADVIL,MOTRIN  Take 1 tablet (600 mg total) by mouth every 6 (six) hours as needed.     PRENATAL VITAMIN PO  Take 1 tablet by mouth daily.        Diet: routine diet  Activity: Advance as tolerated. Pelvic rest for 6 weeks.   Outpatient follow up:6 weeks Follow up Appt:Future Appointments Date Time Provider Department Center  12/06/2015 10:00 AM Willodean Rosenthalarolyn Harraway-Smith, MD [facility]   Follow up Visit:No Follow-up on file.  Postpartum contraception: Progesterone only pills[facility]  Newborn Data: Live born female  Birth Weight: 9 lb 8.4 oz (4321 g) APGAR: 9, 9  Baby Feeding: Breast Disposition:home with mother   10/13/2015 Cam HaiSHAW, Nahomy Limburg, CNM  9:13 AM

## 2015-10-13 NOTE — Lactation Note (Signed)
This note was copied from a baby's chart. Lactation Consultation Note: Mother sitting up on the side of the bed with infant latched on in cradle hold. Mother describes that latch is painful when she first latched and then gets better with the feeding. Mother is using comfort gels.  Observed strong burst of suckling with rhythmic pattern of audible swallows. Advised mother to use good firm support . Mother states that infant has been feeding every 2 hours but slept for 5 hours during the night. Mother informed that this is normal infant behavior and likely the infant will cluster tonight due to age. Mother receptive to all teaching. Reviewed milk storage and engorgement from Baby and Me Book.   Patient Name: Elizabeth Williamson VHQIO'NToday's Date: 10/13/2015 Reason for consult: Follow-up assessment   Maternal Data    Feeding Feeding Type: Breast Fed  LATCH Score/Interventions Latch: Grasps breast easily, tongue down, lips flanged, rhythmical sucking. Intervention(s): Breast compression  Audible Swallowing: Spontaneous and intermittent Intervention(s): Skin to skin;Hand expression  Type of Nipple: Everted at rest and after stimulation  Comfort (Breast/Nipple): Filling, red/small blisters or bruises, mild/mod discomfort  Problem noted: Mild/Moderate discomfort (using comfort gels) Interventions (Mild/moderate discomfort): Comfort gels  Hold (Positioning): No assistance needed to correctly position infant at breast.  LATCH Score: 9  Lactation Tools Discussed/Used     Consult Status Consult Status: Follow-up Date: 10/13/15 Follow-up type: In-patient    Stevan BornKendrick, [facility] 10/13/2015, 2:33 PM

## 2015-10-14 ENCOUNTER — Encounter: Payer: Self-pay | Admitting: *Deleted

## 2015-10-14 NOTE — Progress Notes (Signed)
Patient c/o of possible allergic reaction, stated upper lip seems swollen and tight. No c/o of itching or trouble breathing, unsure of the cause of reaction. Cres-Dishmon, CNM notified. Will give patient PRN Benadryl and continue to monitor.

## 2015-10-14 NOTE — Discharge Summary (Signed)
OB Discharge Summary     Patient Name: Elizabeth Williamson DOB: 16-Nov-1990 MRN: [id number]  Date of admission: 10/11/2015 Delivering MD: Shonna ChockWOUK, NOAH BEDFORD   Date of discharge: 10/14/2015  Admitting diagnosis: 40WKS,CTX Intrauterine pregnancy: 6431w0d     Secondary diagnosis:  Active Problems:   Asthma, mild intermittent   Active labor   NSVD (normal spontaneous vaginal delivery)  Additional problems: none     Discharge diagnosis: Term Pregnancy Delivered                                                                                                Post partum procedures:none  Augmentation: Pitocin  Complications: None  Hospital course:  Onset of Labor With Vaginal Delivery     25 y.o. yo [id number] at 4331w0d was admitted in Active Labor on 10/11/2015. Patient had an uncomplicated labor course as follows:  Membrane Rupture Time/Date: 5:04 PM ,10/11/2015   Intrapartum Procedures: Episiotomy: None [1]                                         Lacerations:  2nd degree [3];Perineal [11]  Patient had a delivery of a Viable infant. 10/12/2015  Information for the patient's newborn:  Caffie PintoWomble, Boy Jasline [[id number]][[id number]]  Delivery Method: Vag-Spont    Pateint had an uncomplicated postpartum course.  She is ambulating, tolerating a regular diet, passing flatus, and urinating well. Patient is discharged home in stable condition on 10/14/2015.    Physical exam  Filed Vitals:   10/12/15 1720 10/13/15 0600 10/13/15 1830 10/14/15 0625  BP: 110/66 104/64 102/64 94/54  Pulse: 100 84 93 90  Temp: 98.3 F (36.8 C) 97.6 F (36.4 C) 97.9 F (36.6 C) 98.3 F (36.8 C)  TempSrc: Oral Oral Oral Oral  Resp: 18 17 18 16   Height:      Weight:      SpO2: 100%   100%   General: alert, cooperative and no distress Lochia: appropriate Uterine Fundus: firm Incision: N/A DVT Evaluation: No evidence of DVT seen on physical exam. Labs: Lab Results  Component Value Date   WBC 13.4* 10/11/2015   HGB 10.4*  10/11/2015   HCT 31.8* 10/11/2015   MCV 85.3 10/11/2015   PLT 168 10/11/2015   CMP Latest Ref Rng 07/19/2014  Glucose 70 - 99 mg/dL 91  BUN 6 - 23 mg/dL 7  Creatinine 2.950.50 - 2.841.10 mg/dL 1.320.58  Sodium 440135 - 102145 mEq/L 141  Potassium 3.5 - 5.3 mEq/L 4.4  Chloride 96 - 112 mEq/L 108  CO2 19 - 32 mEq/L 25  Calcium 8.4 - 10.5 mg/dL 9.3  Total Protein 6.0 - 8.3 g/dL 7.1  Total Bilirubin 0.2 - 1.2 mg/dL 0.2  Alkaline Phos 39 - 117 U/L 70  AST 0 - 37 U/L 38(H)  ALT 0 - 35 U/L 34    Discharge instruction: per After Visit Summary and "Baby and Me Booklet".  After visit meds:    Medication List    TAKE these medications  albuterol 108 (90 Base) MCG/ACT inhaler  Commonly known as:  PROAIR HFA  Inhale 1-2 puffs into the lungs every 4 (four) hours as needed.     ibuprofen 600 MG tablet  Commonly known as:  ADVIL,MOTRIN  Take 1 tablet (600 mg total) by mouth every 6 (six) hours as needed.     PRENATAL VITAMIN PO  Take 1 tablet by mouth daily.        Diet: routine diet  Activity: Advance as tolerated. Pelvic rest for 6 weeks.   Outpatient follow up:6 weeks Follow up Appt:Future Appointments Date Time Provider Department Center  12/06/2015 10:00 AM Willodean Rosenthalarolyn Harraway-Smith, MD [facility]   Follow-up Information    [facility]. Schedule an appointment as soon as possible for a visit in 6 weeks.   Specialty:  Obstetrics and Gynecology   Why:  For your postpartum appointment.   Contact information:   [address] [place] [id number] [phone]      Postpartum contraception: Progesterone only pills  Newborn Data: Live born female  Birth Weight: 9 lb 8.4 oz (4321 g) APGAR: 9, 9  Baby Feeding: Breast Disposition:home with mother   10/14/2015 Almon Herculesaye T Gonfa, MD   I have seen and examined this patient and agree the above assessment.  Respiratory effort normal, lochia appropriate, legs  negative,  pain level normal.  CRESENZO-DISHMAN,Briant Angelillo 10/25/2015 2:16 PM

## 2015-10-14 NOTE — Lactation Note (Addendum)
This note was copied from a baby's chart. Lactation Consultation Note: Mother breastfeeding infant in cross-cradle hold. Infant has good depth. Observed frequent and regular suckles and swallows. Mother has lots of questions about pumping, milk storage and cue base feeding. Mother has old positional strips bilaterally. Comfort gels and coconut oil .  for nipple tenderness. Nipple round and no pinching noted when infant released the breast. Mother advised to continue to rotate positions . Mother advised to pre pump when breast begin to fill. Mother's breast are filling. Mother was given a harmony hand pump with instructions to use piror to latching. Reviewed engorgement and milk storage. Mother advised to continue to cue base feed infant. Mother has PIS Medela. Lactation follow visit scheduled for feeding assessment on July 10 at 12:00 pm. Mother is aware of available LC number and support services .   Patient Name: Boy Harvie HeckMelanie Zazueta ZOXWR'UToday's Date: 10/14/2015 Reason for consult: Follow-up assessment   Maternal Data    Feeding Feeding Type: Breast Fed Nipple Type: Slow - flow Length of feed: 40 min  LATCH Score/Interventions Latch: Grasps breast easily, tongue down, lips flanged, rhythmical sucking. Intervention(s): Adjust position;Assist with latch;Breast compression  Audible Swallowing: Spontaneous and intermittent Intervention(s): Skin to skin;Hand expression  Type of Nipple: Everted at rest and after stimulation  Comfort (Breast/Nipple): Soft / non-tender  Problem noted: Filling;Mild/Moderate discomfort Interventions (Filling): Massage;Firm support;Frequent nursing Interventions (Mild/moderate discomfort): Comfort gels  Hold (Positioning): Assistance needed to correctly position infant at breast and maintain latch.  LATCH Score: 9  Lactation Tools Discussed/Used     Consult Status Consult Status: Follow-up Date: 10/14/15 Follow-up type: In-patient    Stevan BornKendrick, Anthonymichael Munday  [facility] 10/14/2015, 10:10 AM

## 2015-10-15 ENCOUNTER — Telehealth (HOSPITAL_COMMUNITY): Payer: Self-pay | Admitting: Lactation Services

## 2015-10-15 NOTE — Telephone Encounter (Signed)
7/1 9am.  3 day old baby.  Mother called because she noticed R breast producing less than L side and baby not swallowing as much on R side.  She did state she was not breastfeeding as much on R side because she is sore - she states no cracks or bleeding.  She states she is breastfeeding at times for 45 min on L breast only.  Encouraged her to breastfeed on both breasts during each feeding if she can tolerate.  If not able to latch due to soreness then she should pump for 10-15 min.  She did pump with manual pump on R side and felt reassured that she has milk for her baby.  Instructed her how to spoon feed small amount she pumed back to baby.  Encouraged hand expressing before latching each time, unlatching when baby slows, burping and feeding on other breast.  Described cluster feeding, supply and demand and encouraged her to massage/compress breast during feeding.  Suggest for soreness to continue to maintain deep latch, apply ebm and coconut milk and wear shells.  Mother has OP appointment on 10/24/15.

## 2015-10-15 NOTE — Telephone Encounter (Signed)
FOB has questions about feeding cues. During the day the baby has a 10-15 minute feeding then he wants to go back to breast in 2-3 hr but today he wants to go to breast every 30-40 minutes. Mom was tired so FOB took baby upstairs and was able to change his diaper and sooth him to sleep. He is wants to know if mom really needs to be putting baby to breast at every feeding cue. Baby is 3 days. Birth wt 9lb 8oz, lowest wt 8lb 1oz Mom can see/hear swallowing. Lactation in the hospital said baby was doing a good. Mom can express milk easily. They go to the Peds office tomorrow. They have OP lactation apt on 10/24/15.   Baby is cluster feeding and this is normal baby behavior. Baby lost a pound so mom should try to feed him as much as she can until he is back to birth wt. Babies go to breast for food, comfort, digestion, and to increase mom's supply. If mom is sure that baby has eaten well then FOB can sooth baby if mom needs to take a nap.

## 2015-10-17 ENCOUNTER — Encounter: Payer: 59 | Admitting: Obstetrics and Gynecology

## 2015-10-18 NOTE — Telephone Encounter (Signed)
Opened in error

## 2015-10-19 ENCOUNTER — Inpatient Hospital Stay (HOSPITAL_COMMUNITY): Payer: 59

## 2015-10-22 ENCOUNTER — Telehealth (HOSPITAL_COMMUNITY): Payer: Self-pay

## 2015-10-22 NOTE — Telephone Encounter (Signed)
Mom has concerns about milk supply on right breast and is doing well on left breast.  Baby is 482 weeks old.  Discussed increasing supply with post pumping. Baby has follow up peds appointment on Wednesday.

## 2015-10-24 ENCOUNTER — Telehealth (HOSPITAL_COMMUNITY): Payer: Self-pay | Admitting: Lactation Services

## 2015-10-24 ENCOUNTER — Ambulatory Visit (HOSPITAL_COMMUNITY)
Admit: 2015-10-24 | Discharge: 2015-10-24 | Disposition: A | Discharge status: Home / Self Care | Payer: 59 | Attending: Family Medicine | Admitting: Family Medicine

## 2015-10-24 NOTE — Telephone Encounter (Signed)
Lactation Telephone call:  Mom called LC office at 1934 pm , and LC returned call at 2015. Mom had questions regarding latching and the depth we obtained at the consult Today. Per mom baby latched once well and then it has been difficult .  [facility] consult today and the best way to obtain  Depth. Mom had over fullness on the left breast, and LC reminded mom she will have  to hand express off 10 -15 ml 1st due to over fullness , and then reverse pressure.  So the tissue behind the nipple is compressible like a sandwich for a deeper latch .  Also reminded mom due to decreased milk supply on the right breast if baby is to fussy Or hanging out due to decreased flow just take him to the left breast where there is more volume  And then switch to the right after wards, and post pump right only. LC reassured mom she has good  Milk supply on the left , with feeding and post pumping will build back up the right. And breast feeding and latching Takes practice. LC also recommended attending [facility] feeding [facility] .

## 2015-10-24 NOTE — Lactation Note (Signed)
Lactation Consult  Mother's reason for visit: help with breast feeding  Visit Type:  Feeding assessment  Appointment Notes:  Feeding assessment - pt. Confirmed appt. For 7/10  Consult:  Initial Lactation Consultant:  Kathrin GreathouseIorio, Abir Eroh Ann  ________________________________________________________________________  Elizabeth FloresBaby's Name: Elizabeth Jonette MateMcKinley Torrico Date of Birth: 10/12/2017 Pediatrician: Dr. Eliberto IvoryWilliam Clark  Gender: female Gestational Age: 5182w0d (At Birth) Birth Weight: 9 lb 8.4 oz (4321 g) Weight at Discharge: Weight: 8 lb 12.4 oz (3980 g)Date of Dis[facility]: [facility]/14/2015 [facility]   10/12/15 0237 10/12/15 2343 10/14/15 0017  Weight: 9 lb 8.4 oz (4321 g) 9 lb 1.3 oz (4120 g) 8 lb 12.4 oz (3980 g)   [facility] 9-4 oz 7/3  Location:[facility] Weight today:9-9.4 oz 4348 g     __________________________________________________________________  Mother's Name: Fredda HammedMelanie C Bare Type of delivery: Vaginal    Breastfeeding Experience:  1st baby  Maternal Medical Conditions:  No risk  Maternal Medications:  PNV   ________________________________________________________________________  Breastfeeding History (Post Discharge)  Frequency of breastfeeding: every 1 1./2 - 2 hours  Duration of feeding:  30-45 mins   Supplementing: none   Pumping: Hand pump ( Medela ) and DEBP Medela ( per mom has used the DEBP yet, only the hand pump occasionally)   Infant Intake and Output Assessment  Voids:  7-8  in 24 hrs.  Color:  Clear yellow Stools: 5-6  in 24 hrs.  Color:  Yellow  ________________________________________________________________________  Maternal Breast Assessment  Breast:  Full  ( left greater than left  Nipple:  Erect Pain level:  2 right side , improved with depth , 3 with left, released - reverse pressure and re-latched less painful  Pain interventions:  Expressed breast  milk  _______________________________________________________________________ Feeding Assessment/Evaluation  Initial feeding assessment:  Infant's oral assessment:  Variance - high palate , LC suspect a short labial frenulum ( upper lip stretches with exam and at the the latch with assist To flip it outward to flanged position), also suspect short anterior and posterior lingual frenulum. Contributing factor to moms sore nipples.  Small blister noted in the middle of upper lip .   Positioning:  Football Right breast - per mom my right doesn't seem as full has it did when the milk came in.  LATCH documentation:  Latch:  1 = Repeated attempts needed to sustain latch, nipple held in mouth throughout feeding, stimulation needed to elicit sucking reflex.  Audible swallowing:  2 = Spontaneous and intermittent  Type of nipple:  2 = Everted at rest and after stimulation  Comfort (Breast/Nipple):  1 = Filling, red/small blisters or bruises, mild/mod discomfort  Hold (Positioning):  1 = Assistance needed to correctly position infant at breast and maintain latch  LATCH score:  7   Attached assessment:  Shallow @ 1st , LC assisted to flip upper lip to flanged position, improved.   Lips flanged:  No.  Lips untucked:  Yes.    Suck assessment:  Nutritive and Nonnutritive  Tools:  Shells ( mom received in the hospital - per mom are working well Instructed on use and cleaning of tool:  No.  Pre-feed weight:  4348 g , 9-9.4 oz  Post-feed weight: 4362 g , 9-9-9 oz  Amount transferred: 14 ml  Amount supplemented:  None   Additional Feeding Assessment -   Infant's oral assessment:  Variance  Positioning:  Football Left breast - areola semi compressible due to over fullness, LC reviewed  hand expressing enough to make the areola  Like a thinner sandwich so it would make it easier for the baby to obtain the depth at the breast.  And decrease moms soreness with latch, also reverse pressure exercise,  ( repeat if needed )  Per mom my left breast fills back after feedings by the next feed compared to the right.   LATCH documentation:   Latch:  2 = Grasps breast easily, tongue down, lips flanged, rhythmical sucking.  Audible swallowing:  2 = Spontaneous and intermittent  Type of nipple:  2 = Everted at rest and after stimulation  Comfort (Breast/Nipple):  1 = Filling, red/small blisters or bruises, mild/mod discomfort  Hold (Positioning):  1 = Assistance needed to correctly position infant at breast and maintain latch  LATCH score: 8   Attached assessment:  Deep  Lips flanged:  Yes.    Lips untucked:  Yes.    Suck assessment:  Nutritive  Pre-feed weight: 4362 g , 9-9.9 oz Post-feed weight:  4418 g , 9-11.8 oz  Amount transferred: 56 ml  Amount supplemented: none needed    Total amount pumped post feed:  Not needed   Total amount transferred:  70 ml  Total supplement given:  None needed   Lactation Impression:  Baby hit a milestone today - weight today over birth weight - 9-9.4 oz  Oral variance noted - see assessment above, resource for tongue - tie given to mom.  LC recommends if mom soreness with latching continues baby should be assessed by an oral specialist  Baby is 89 weeks old and the soreness should be improving.  Baby transferred well 70 ml total - 14 ml on the right ( LC feels milk supply has decreased on that breast - indicating extra post pumping )  56 ml off the left ) - mom aware and had [facility] attention before latch.  No breakdown of tissue, but sore to latch, improves with breast compressions.  LC concerned for moms let down with prolonged soreness. Per mom initially some challenges with engorgement - massage and feeding often helped.   Lactation Plan of Care:  Praised mom for her breast feeding efforts and dad's support  Days - skin to skin feedings until the baby can stay awake for feeding - gradually shifting the baby to feeds more on days  and evenings If to full on the left - hand express- off 10 -15 ml , reverse pressure as shown and latch with breast compressions until swallows and comfort achieved.  Right breast  - due to decreased supply , after feeding - post pump 10 mins top enhance supply.  If Dextan noted to be hanging out - non - nutritive feeding , release and switch to the left.  Transitioning to prepare for going back to work at 4 weeks due to returning back to work at 8 weeks.  Pumping = a feeding to remind your brain to keep producing milk.  When Dextan receives a bottle for a feeding - ( 2-3 oz to start ) use a medium based nipple.

## 2015-11-01 ENCOUNTER — Encounter (INDEPENDENT_AMBULATORY_CARE_PROVIDER_SITE_OTHER): Payer: 59 | Admitting: *Deleted

## 2015-11-01 DIAGNOSIS — Z3403 Encounter for supervision of normal first pregnancy, third trimester: Secondary | ICD-10-CM

## 2015-11-16 ENCOUNTER — Encounter: Payer: Self-pay | Admitting: Family Medicine

## 2015-11-16 ENCOUNTER — Ambulatory Visit (INDEPENDENT_AMBULATORY_CARE_PROVIDER_SITE_OTHER): Payer: 59 | Admitting: Family Medicine

## 2015-11-16 VITALS — BP 110/68 | HR 68 | Ht 66.5 in | Wt 178.2 lb

## 2015-11-16 DIAGNOSIS — M659 Synovitis and tenosynovitis, unspecified: Secondary | ICD-10-CM | POA: Diagnosis not present

## 2015-11-16 MED ORDER — NAPROXEN 500 MG PO TABS: 500.0000 mg | ORAL_TABLET | Freq: Two times a day (BID) | ORAL | 0 refills | Status: DC

## 2015-11-16 NOTE — Progress Notes (Signed)
Chief Complaint  Patient presents with  . Wrist Pain    left wrist pain x 2 weeks.    Pain is in the left wrist, at the base of the thumb and extends up the forearm. Started 2 weeks ago.  She is 4 weeks postpartum.  Hurts with certain twisting motions of the thumb, like when she pulls up her pants, and when she lifts/holds the baby.  Son is currently 11#  She got a thumb spica splint, and has been using it for a week--it feels better when she wears it, but pain is still there when she takes it off. She has also been icing it at night.  She is currently out on maternity leave.  Trying to get this fixed before going back to work on 8/21.  PMH, PSH, SH reviewed and updated Social History   Social History  . Marital status: Married    Spouse name: N/A  . Number of children: N/A  . Years of education: N/A   Occupational History  . nurse tech at [employer]   Social History Main Topics  . Smoking status: Former Smoker    Packs/day: 0.50    Years: 3.00    Types: Cigarettes    Quit date: 08/14/2013  . Smokeless tobacco: Never Used     Comment: increased to 3 pack/week  . Alcohol use 0.0 oz/week     Comment: Occasional social drinker, every other weekend  . Drug use: No  . Sexual activity: Yes    Partners: Male    Birth control/ protection: None   Other Topics Concern  . 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 No pets. 1 son (Dexton) born 10/12/15.  No passive tobacco exposure.    Outpatient Encounter Prescriptions as of 11/16/2015  Medication Sig  . ibuprofen (ADVIL,MOTRIN) 600 MG tablet Take 1 tablet (600 mg total) by mouth every 6 (six) hours as needed.  . Prenatal Vit-Fe Fumarate-FA (PRENATAL VITAMIN PO) Take 1 tablet by mouth daily.  Marland Kitchen albuterol (PROAIR HFA) 108 (90 BASE) MCG/ACT inhaler Inhale 1-2 puffs into the lungs every 4 (four) hours as needed. (Patient not taking: Reported on 11/16/2015)  . naproxen  (NAPROSYN) 500 MG tablet Take 1 tablet (500 mg total) by mouth 2 (two) times daily with a meal.   No facility-administered encounter medications on file as of 11/16/2015.    (naproxen rx'd today, not taking it prior to visit)  No Known Allergies  ROS: no fever, chills, GI complaints, numbness, tingling, weakness.  No depression or other complaints.  Losing the baby weight and feeling good.   PHYSICAL EXAM: BP 110/68   Pulse 68   Ht 5' 6.5" (1.689 m)   Wt 178 lb 3.2 oz (80.8 kg)   Breastfeeding? No   BMI 28.33 kg/m   Well appearing, pleasant female in good spirits, in no distress Left wrist: +finkelstein test on the left. Pain with thumb extension against resistance. No soft tissue swelling, erythema, warmth.  FROM Normal pulses  ASSESSMENT/PLAN:  Tenosynovitis of finger and hand - Plan: naproxen (NAPROSYN) 500 MG tablet   DeQuervain's tenosynovitis Splint and NSAIDs Discussed injection vs PT/OT if not improving   Continue to wear the thumb spica splint to provide some immobilization and rest. Stop taking ibuprofen and instead start the naproxen. Take it twice daily with food for at least 7 days--take up to the full 2 weeks, if needed.  Take it regularly twice daily until your pain has completely resolved. Take the naproxen with food.  Cut the dose in half if it bothers your stomach. Don't take any other over-the-counter pain medications, except tylenol would be okay.

## 2015-11-16 NOTE — Patient Instructions (Signed)
DeQuervain's tenosynovitis  Continue to wear the thumb spica splint to provide some immobilization and rest. Stop taking ibuprofen and instead start the naproxen. Take it twice daily with food for at least 7 days--take up to the full 2 weeks, if needed.  Take it regularly twice daily until your pain has completely resolved. Take the naproxen with food.  Cut the dose in half if it bothers your stomach. Don't take any other over-the-counter pain medications, except tylenol would be okay.   Lollie Sails Disease Lollie Sails disease is inflammation of the tendon on the thumb side of the wrist. Tendons are cords of tissue that connect bones to muscles. The tendons in your hand pass through a tunnel, or sheath. A slippery layer of tissue (synovium) lets the tendons move smoothly in the sheath. With de Quervain disease, the sheath swells or thickens, causing friction and pain. The condition is also called de Quervain tendinosis and de Quervain syndrome. It occurs most often in women who are 58-13 years old. CAUSES  The exact cause of de Quervain disease is not known. It may result from:   Overusing your hands, especially with repetitive motions that involve twisting your hand or using a forceful grip.  Pregnancy.  Rheumatoid disease. RISK FACTORS You may have a greater risk for de Quervain disease if you:  Are a middle-aged woman.  Are pregnant.  Have rheumatoid arthritis.  Have diabetes.  Use your hands far more than normal, especially with a tight grip or excessive twisting. SIGNS AND SYMPTOMS Pain on the thumb side of your wrist is the main symptom of de Quervain disease. Other signs and symptoms include:  Pain that gets worse when you grasp something or turn your wrist.  Pain that extends up the forearm.  Cysts in the area of the pain.  Swelling of your wrist and hand.  A sensation of snapping in the wrist.  Trouble moving the thumb and wrist. DIAGNOSIS  Your health care  provider may diagnose de Quervain disease based on your signs and symptoms. A physical exam will also be done. A simple test Lourena Simmonds test) that involves pulling your thumb and wrist to see if this causes pain can help determine whether you have the condition. Sometimes you may need to have an X-ray.  TREATMENT  Avoiding any activity that causes pain and swelling is the best treatment. Other options include:  Wearing a splint.  Taking medicine. Anti-inflammatory medicines and corticosteroid injections may reduce inflammation and relieve pain.  Having surgery if other treatments do not work. HOME CARE INSTRUCTIONS   Using ice can be helpful after doing activities that involve the sore wrist. To apply ice to the injured area:  Put ice in a plastic bag.  Place a towel between your skin and the bag.  Leave the ice on for 20 minutes, 2-3 times a day.  Take medicines only as directed by your health care provider.  Wear your splint as directed. This will allow your hand to rest and heal. SEEK MEDICAL CARE IF:   Your pain medicine does not help.   Your pain gets worse.  You develop new symptoms. MAKE SURE YOU:   Understand these instructions.  Will watch your condition.  Will get help right away if you are not doing well or get worse.   This information is not intended to replace advice given to you by your health care provider. Make sure you discuss any questions you have with your health care provider.  Document Released: 12/26/2000 Document Revised: 04/23/2014 Document Reviewed: 08/05/2013 Elsevier Interactive Patient Education [employer]

## 2015-12-02 ENCOUNTER — Telehealth: Payer: Self-pay | Admitting: *Deleted

## 2015-12-02 NOTE — Telephone Encounter (Signed)
Pt is 7 weeks postpartum, states she started experiencing increased bleeding and passing a few small clots after exercising yesterday.  Is currently not breastfeeding.  Informed pt that she could be having a menstrual cycle at this point and usually the first cycle after having the baby can be heavy and more uncomfortable.  Informed pt to call back with any additional changes.

## 2015-12-02 NOTE — Telephone Encounter (Signed)
-----   Message from Olevia BowensJacinda S Battle sent at 12/02/2015  8:38 AM EDT ----- Regarding: advise Contact: [phone] Having concerns about her bleeding, she states it started after she started exercising. Now passing clots, wants to know should she be concerned

## 2015-12-05 NOTE — Progress Notes (Deleted)
Post Partum Exam  Elizabeth Williamson is a 25 y.o. 253P1021 female who presents for a postpartum visit. She is {1-10:13787} {time; units:18646} postpartum following a {delivery:12449}. I have fully reviewed the prenatal and intrapartum course. The delivery was at *** gestational weeks.  Anesthesia: {anesthesia types:812}. Postpartum course has been ***. Baby's course has been ***. Baby is feeding by {breast/bottle:69}. Bleeding {vag bleed:12292}. Bowel function is {normal:32111}. Bladder function is {normal:32111}. Patient {is/is not:9024} sexually active. Contraception method is {contraceptive method:5051}. Postpartum depression screening:neg  {Common ambulatory SmartLinks:19316}  Review of Systems {ros; complete:30496}   Objective:    BP 116/78 mmHg  Pulse 78  Resp 16  Ht 5\' 5"  (1.651 m)  Wt 211 lb (95.709 kg)  BMI 35.11 kg/m2  Breastfeeding? Yes  General:  {gen appearance:16600}   Breasts:  {breast exam:1202::"inspection negative, no nipple discharge or bleeding, no masses or nodularity palpable"}  Lungs: {lung exam:16931}  Heart:  {heart exam:5510}  Abdomen: {abdomen exam:16834}   Vulva:  {labia exam:12198}  Vagina: {vagina exam:12200}  Cervix:  {cervix exam:14595}  Corpus: {uterus exam:12215}  Adnexa:  {adnexa exam:12223}  Rectal Exam: {rectal/vaginal exam:12274}        Assessment:    *** postpartum exam. Pap smear {done:10129} at today's visit.   Plan:    1. Contraception: {method:5051} 2. *** 3. Follow up in: {1-10:13787} {time; units:19136} or as needed.

## 2015-12-06 ENCOUNTER — Ambulatory Visit: Payer: 59 | Admitting: Obstetrics & Gynecology

## 2016-04-16 NOTE — L&D Delivery Note (Signed)
Delivery Note Pt pushed twice and at 6:09 PM a viable female was delivered via Vaginal, Spontaneous Delivery (Presentation OA ).  APGAR: 9, 9, ; weight pending .   Placenta status: delivered spontaneously, intact, shultz, .  Cord:3vc  with the following complications: leg cord x 1 .  Cord pH:n/a   Anesthesia:Epidural   Episiotomy: None Lacerations: None Suture Repair: n/a Est. Blood Loss (mL): 350  Mom to postpartum.  Baby to Couplet care / Skin to Skin.  Cathrine MusterCecilia W Banga 12/15/2016, 6:19 PM

## 2016-05-04 ENCOUNTER — Ambulatory Visit (INDEPENDENT_AMBULATORY_CARE_PROVIDER_SITE_OTHER): Payer: 59 | Admitting: Emergency Medicine

## 2016-05-04 VITALS — BP 110/64 | HR 85 | Temp 98.5°F | Resp 16 | Ht 66.5 in | Wt 182.8 lb

## 2016-05-04 DIAGNOSIS — R112 Nausea with vomiting, unspecified: Secondary | ICD-10-CM

## 2016-05-04 DIAGNOSIS — Z3490 Encounter for supervision of normal pregnancy, unspecified, unspecified trimester: Secondary | ICD-10-CM | POA: Diagnosis not present

## 2016-05-04 DIAGNOSIS — R197 Diarrhea, unspecified: Secondary | ICD-10-CM | POA: Diagnosis not present

## 2016-05-04 DIAGNOSIS — Z3491 Encounter for supervision of normal pregnancy, unspecified, first trimester: Secondary | ICD-10-CM

## 2016-05-04 MED ORDER — ONDANSETRON HCL 4 MG PO TABS: 4.0000 mg | ORAL_TABLET | Freq: Three times a day (TID) | ORAL | 0 refills | Status: DC | PRN

## 2016-05-04 NOTE — Patient Instructions (Addendum)
     IF you received an x-ray today, you will receive an invoice from [facility]. Please contact [facility] at [phone] with questions or concerns regarding your invoice.   IF you received labwork today, you will receive an invoice from [facility]. Please contact [facility] at [phone] with questions or concerns regarding your invoice.   Our billing staff will not be able to assist you with questions regarding bills from these companies.  You will be contacted with the lab results as soon as they are available. The fastest way to get your results is to activate your My Chart account. Instructions are located on the last page of this paperwork. If you have not heard from us regarding the results in 2 weeks, please contact this office.     Bland Diet Introduction A bland diet consists of foods that do not have a lot of fat or fiber. Foods without fat or fiber are easier for the body to digest. They are also less likely to irritate your mouth, throat, stomach, and other parts of your gastrointestinal tract. A bland diet is sometimes called a BRAT diet. What is my plan? Your health care provider or dietitian may recommend specific changes to your diet to prevent and treat your symptoms, such as:  Eating small meals often.  Cooking food until it is soft enough to chew easily.  Chewing your food well.  Drinking fluids slowly.  Not eating foods that are very spicy, sour, or fatty.  Not eating citrus fruits, such as oranges and grapefruit. What do I need to know about this diet?  Eat a variety of foods from the bland diet food list.  Do not follow a bland diet longer than you have to.  Ask your health care provider whether you should take vitamins. What foods can I eat? Grains  Hot cereals, such as cream of wheat. Bread, crackers, or tortillas made from refined white flour. Rice. Vegetables  Canned or cooked vegetables. Mashed or boiled potatoes. Fruits   Bananas. Applesauce. Other types of cooked or canned fruit with the skin and seeds removed, such as canned peaches or pears. Meats and Other Protein Sources  Scrambled eggs. Creamy peanut butter or other nut butters. Lean, well-cooked meats, such as chicken or fish. Tofu. Soups or broths. Dairy  Low-fat dairy products, such as milk, cottage cheese, or yogurt. Beverages  Water. Herbal tea. Apple juice. Sweets and Desserts  Pudding. Custard. Fruit gelatin. Ice cream. Fats and Oils  Mild salad dressings. Canola or olive oil. The items listed above may not be a complete list of allowed foods or beverages. Contact your dietitian for more options.  What foods are not recommended? Foods and ingredients that are often not recommended include:  Spicy foods, such as hot sauce or salsa.  Fried foods.  Sour foods, such as pickled or fermented foods.  Raw vegetables or fruits, especially citrus or berries.  Caffeinated drinks.  Alcohol.  Strongly flavored seasonings or condiments. The items listed above may not be a complete list of foods and beverages that are not allowed. Contact your dietitian for more information.  This information is not intended to replace advice given to you by your health care provider. Make sure you discuss any questions you have with your health care provider. Document Released: 07/25/2015 Document Revised: 09/08/2015 Document Reviewed: 04/14/2014  2017 Elsevier  

## 2016-05-04 NOTE — Progress Notes (Signed)
Elizabeth Williamson 25 y.o.   Chief Complaint  Patient presents with  . Diarrhea     x 1 week - [redacted] weeks pregnant  . Emesis    HISTORY OF PRESENT ILLNESS: This is a 26 y.o. female complaining of nausea, vomiting, and diarrhea since last Sunday night. Vomiting improved but diarrhea still persists. [redacted] weeks pregnant.  Diarrhea   This is a new problem. The current episode started in the past 7 days. The problem occurs 2 to 4 times per day. The problem has been waxing and waning. The stool consistency is described as watery. The patient states that diarrhea does not awaken her from sleep. Associated symptoms include vomiting. Pertinent negatives include no abdominal pain, bloating, chills, coughing, fever, headaches, myalgias or URI. The symptoms are aggravated by stress. 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 She has tried nothing for the symptoms.  Emesis   This is a new problem. The current episode started in the past 7 days. The problem has been gradually improving. The emesis has an appearance of stomach contents. There has been no fever. Associated symptoms include diarrhea. Pertinent negatives include no abdominal pain, chest pain, chills, coughing, dizziness, fever, headaches, myalgias or URI.     Prior to Admission medications   Medication Sig Start Date End Date Taking? Authorizing Provider  Prenatal Vit-Fe Fumarate-FA (PRENATAL VITAMIN PO) Take 1 tablet by mouth daily.   Yes Historical Provider, MD  albuterol (PROAIR HFA) 108 (90 BASE) MCG/ACT inhaler Inhale 1-2 puffs into the lungs every 4 (four) hours as needed. Patient not taking: Reported on 11/16/2015 07/19/14   [name], MD  ondansetron (ZOFRAN) 4 MG tablet Take 1 tablet (4 mg total) by mouth every 8 (eight) hours as needed for nausea or vomiting. 05/04/16   [name] December, MD    No Known Allergies  Patient Active Problem List   Diagnosis Date Noted  . NSVD (normal spontaneous vaginal delivery) 10/14/2015  . Active labor 10/11/2015   . Group B Streptococcus carrier, antepartum 09/17/2015  . Asthma, mild intermittent 05/06/2015  . Allergic rhinitis 05/06/2015  . Supervision of normal first pregnancy 05/06/2015  . Panic attack 12/30/2013  . Gastroesophageal reflux disease without esophagitis 11/09/2013    Past Medical History:  Diagnosis Date  . Alopecia    got injections through dermatologist  . Migraine    rare    Past Surgical History:  Procedure Laterality Date  . NO PAST SURGERIES      Social History   Social History  . Marital status: Married    Spouse name: N/A  . Number of children: N/A  . Years of education: N/A   Occupational History  . nurse tech at [employer]   Social History Main Topics  . Smoking status: Former Smoker    Packs/day: 0.50    Years: 3.00    Types: Cigarettes    Quit date: 08/14/2013  . Smokeless tobacco: Never Used     Comment: increased to 3 pack/week  . Alcohol use 0.0 oz/week     Comment: Occasional social drinker, every other weekend  . Drug use: No  . Sexual activity: Yes    Partners: Male    Birth control/ protection: None   Other Topics Concern  . 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 No pets. 1 son (Dexton) born 10/12/15.  No passive tobacco exposure.     Family History  Problem  Relation Age of Onset  . Heart disease Brother     recently found to have enlarged heart (age 28)  . Diabetes Maternal Grandmother   . Cancer Maternal Grandfather     colon cancer (60's)  . Hypertension Paternal Grandmother   . Cancer Paternal Uncle     lung cancer (nonsmoker)     Review of Systems  Constitutional: Negative for chills and fever.  HENT: Negative for congestion, ear discharge, nosebleeds, sinus pain and sore throat.   Eyes: Negative for discharge and redness.  Respiratory: Negative for cough, hemoptysis, shortness of breath and wheezing.   Cardiovascular: Negative for chest pain and  palpitations.  Gastrointestinal: Positive for diarrhea, nausea and vomiting. Negative for abdominal pain, bloating, blood in stool, constipation and melena.  Genitourinary: Negative for dysuria and hematuria.  Musculoskeletal: Negative for myalgias.  Skin: Negative for rash.  Neurological: Negative for dizziness, sensory change, focal weakness and headaches.  Endo/Heme/Allergies: Negative.   Psychiatric/Behavioral: Negative.   All other systems reviewed and are negative.  Vitals:   05/04/16 1639  BP: 110/64  Pulse: 85  Resp: 16  Temp: 98.5 F (36.9 C)     Physical Exam  Constitutional: She is oriented to person, place, and time. She appears well-developed and well-nourished.  HENT:  Head: Normocephalic and atraumatic.  Nose: Nose normal.  Mouth/Throat: Oropharynx is clear and moist. No oropharyngeal exudate.  Eyes: Conjunctivae and EOM are normal. Pupils are equal, round, and reactive to light.  Neck: Normal range of motion. Neck supple. No thyromegaly present.  Cardiovascular: Normal rate, regular rhythm, normal heart sounds and intact distal pulses.   Pulmonary/Chest: Effort normal and breath sounds normal.  Abdominal: Soft. Bowel sounds are normal. She exhibits no distension. There is no tenderness.  Musculoskeletal: Normal range of motion.  Lymphadenopathy:    She has no cervical adenopathy.  Neurological: She is alert and oriented to person, place, and time. She displays normal reflexes. No sensory deficit. She exhibits normal muscle tone.  Skin: Skin is warm and dry. Capillary refill takes less than 2 seconds.  Psychiatric: She has a normal mood and affect. Her behavior is normal.  Vitals reviewed.    ASSESSMENT & PLAN: Elizabeth Williamson was seen today for diarrhea and emesis.  Diagnoses and all orders for this visit:  Nausea and vomiting, intractability of vomiting not specified, unspecified vomiting type -     CBC with Differential/Platelet -     Comprehensive metabolic  panel  Diarrhea, unspecified type  First trimester pregnancy  Other orders -     ondansetron (ZOFRAN) 4 MG tablet; Take 1 tablet (4 mg total) by mouth every 8 (eight) hours as needed for nausea or vomiting.    Patient Instructions        IF you received an x-ray today, you will receive an invoice from [facility]. Please contact [facility] at [phone] with questions or concerns regarding your invoice.   IF you received labwork today, you will receive an invoice from [facility]. Please contact [facility] at [phone] with questions or concerns regarding your invoice.   Our billing staff will not be able to assist you with questions regarding bills from these companies.  You will be contacted with the lab results as soon as they are available. The fastest way to get your results is to activate your My Chart account. Instructions are located on the last page of this paperwork. If you have not heard from us regarding the results in 2 weeks, please contact  this office.      Bland Diet Introduction A bland diet consists of foods that do not have a lot of fat or fiber. Foods without fat or fiber are easier for the body to digest. They are also less likely to irritate your mouth, throat, stomach, and other parts of your gastrointestinal tract. A bland diet is sometimes called a BRAT diet. What is my plan? Your health care provider or dietitian may recommend specific changes to your diet to prevent and treat your symptoms, such as:  Eating small meals often.  Cooking food until it is soft enough to chew easily.  Chewing your food well.  Drinking fluids slowly.  Not eating foods that are very spicy, sour, or fatty.  Not eating citrus fruits, such as oranges and grapefruit. What do I need to know about this diet?  Eat a variety of foods from the bland diet food list.  Do not follow a bland diet longer than you have to.  Ask your health care provider  whether you should take vitamins. What foods can I eat? Grains  Hot cereals, such as cream of wheat. Bread, crackers, or tortillas made from refined white flour. Rice. Vegetables  Canned or cooked vegetables. Mashed or boiled potatoes. Fruits  Bananas. Applesauce. Other types of cooked or canned fruit with the skin and seeds removed, such as canned peaches or pears. Meats and Other Protein Sources  Scrambled eggs. Creamy peanut butter or other nut butters. Lean, well-cooked meats, such as chicken or fish. Tofu. Soups or broths. Dairy  Low-fat dairy products, such as milk, cottage cheese, or yogurt. Beverages  Water. Herbal tea. Apple juice. Sweets and Desserts  Pudding. Custard. Fruit gelatin. Ice cream. Fats and Oils  Mild salad dressings. Canola or olive oil. The items listed above may not be a complete list of allowed foods or beverages. Contact your dietitian for more options.  What foods are not recommended? Foods and ingredients that are often not recommended include:  Spicy foods, such as hot sauce or salsa.  Fried foods.  Sour foods, such as pickled or fermented foods.  Raw vegetables or fruits, especially citrus or berries.  Caffeinated drinks.  Alcohol.  Strongly flavored seasonings or condiments. The items listed above may not be a complete list of foods and beverages that are not allowed. Contact your dietitian for more information.  This information is not intended to replace advice given to you by your health care provider. Make sure you discuss any questions you have with your health care provider. Document Released: 07/25/2015 Document Revised: 09/08/2015 Document Reviewed: 04/14/2014  2017 Elsevier      Edwina Barth, MD [facility]

## 2016-05-05 LAB — CBC WITH DIFFERENTIAL/PLATELET
BASOS ABS: 0 10*3/uL (ref 0.0–0.2)
Basos: 1 %
EOS (ABSOLUTE): 0.1 10*3/uL (ref 0.0–0.4)
Eos: 2 %
Hematocrit: 37.7 % (ref 34.0–46.6)
Hemoglobin: 12.4 g/dL (ref 11.1–15.9)
IMMATURE GRANS (ABS): 0 10*3/uL (ref 0.0–0.1)
Immature Granulocytes: 0 %
LYMPHS: 38 %
Lymphocytes Absolute: 1.9 10*3/uL (ref 0.7–3.1)
MCH: 27.2 pg (ref 26.6–33.0)
MCHC: 32.9 g/dL (ref 31.5–35.7)
MCV: 83 fL (ref 79–97)
Monocytes Absolute: 0.5 10*3/uL (ref 0.1–0.9)
Monocytes: 9 %
NEUTROS ABS: 2.6 10*3/uL (ref 1.4–7.0)
Neutrophils: 50 %
PLATELETS: 277 10*3/uL (ref 150–379)
RBC: 4.56 x10E6/uL (ref 3.77–5.28)
RDW: 15.1 % (ref 12.3–15.4)
WBC: 5.2 10*3/uL (ref 3.4–10.8)

## 2016-05-05 LAB — COMPREHENSIVE METABOLIC PANEL
ALK PHOS: 66 IU/L (ref 39–117)
ALT: 27 IU/L (ref 0–32)
AST: 23 IU/L (ref 0–40)
Albumin/Globulin Ratio: 1.6 (ref 1.2–2.2)
Albumin: 4.6 g/dL (ref 3.5–5.5)
BUN/Creatinine Ratio: 9 (ref 9–23)
BUN: 5 mg/dL — AB (ref 6–20)
CHLORIDE: 103 mmol/L (ref 96–106)
CO2: 18 mmol/L (ref 18–29)
Calcium: 9.3 mg/dL (ref 8.7–10.2)
Creatinine, Ser: 0.57 mg/dL (ref 0.57–1.00)
GFR calc Af Amer: 147 mL/min/{1.73_m2} (ref >59)
GFR calc non Af Amer: 127 mL/min/{1.73_m2} (ref >59)
GLUCOSE: 100 mg/dL — AB (ref 65–99)
Globulin, Total: 2.9 g/dL (ref 1.5–4.5)
Potassium: 4.2 mmol/L (ref 3.5–5.2)
Sodium: 139 mmol/L (ref 134–144)
Total Protein: 7.5 g/dL (ref 6.0–8.5)

## 2016-05-08 DIAGNOSIS — Z3201 Encounter for pregnancy test, result positive: Secondary | ICD-10-CM | POA: Diagnosis not present

## 2016-05-08 DIAGNOSIS — N912 Amenorrhea, unspecified: Secondary | ICD-10-CM | POA: Diagnosis not present

## 2016-05-09 ENCOUNTER — Telehealth: Payer: Self-pay | Admitting: *Deleted

## 2016-05-09 NOTE — Telephone Encounter (Signed)
Lm lab results

## 2016-05-21 DIAGNOSIS — O26891 Other specified pregnancy related conditions, first trimester: Secondary | ICD-10-CM | POA: Diagnosis not present

## 2016-05-21 DIAGNOSIS — Z3A09 9 weeks gestation of pregnancy: Secondary | ICD-10-CM | POA: Diagnosis not present

## 2016-05-30 DIAGNOSIS — Z3490 Encounter for supervision of normal pregnancy, unspecified, unspecified trimester: Secondary | ICD-10-CM | POA: Insufficient documentation

## 2016-05-30 DIAGNOSIS — Z3689 Encounter for other specified antenatal screening: Secondary | ICD-10-CM | POA: Diagnosis not present

## 2016-05-30 DIAGNOSIS — Z113 Encounter for screening for infections with a predominantly sexual mode of transmission: Secondary | ICD-10-CM | POA: Diagnosis not present

## 2016-05-30 DIAGNOSIS — Z3A10 10 weeks gestation of pregnancy: Secondary | ICD-10-CM | POA: Diagnosis not present

## 2016-05-30 DIAGNOSIS — Z3481 Encounter for supervision of other normal pregnancy, first trimester: Secondary | ICD-10-CM | POA: Diagnosis not present

## 2016-05-30 LAB — OB RESULTS CONSOLE GC/CHLAMYDIA
Chlamydia: NEGATIVE
Gonorrhea: NEGATIVE

## 2016-05-30 LAB — OB RESULTS CONSOLE RPR: RPR: NONREACTIVE

## 2016-05-30 LAB — OB RESULTS CONSOLE HEPATITIS B SURFACE ANTIGEN: HEP B S AG: NEGATIVE

## 2016-05-30 LAB — OB RESULTS CONSOLE ANTIBODY SCREEN: ANTIBODY SCREEN: NEGATIVE

## 2016-05-30 LAB — OB RESULTS CONSOLE ABO/RH: RH Type: POSITIVE

## 2016-05-30 LAB — OB RESULTS CONSOLE RUBELLA ANTIBODY, IGM: Rubella: IMMUNE

## 2016-05-30 LAB — OB RESULTS CONSOLE HIV ANTIBODY (ROUTINE TESTING): HIV: NONREACTIVE

## 2016-07-23 DIAGNOSIS — Z3A18 18 weeks gestation of pregnancy: Secondary | ICD-10-CM | POA: Diagnosis not present

## 2016-07-23 DIAGNOSIS — Z363 Encounter for antenatal screening for malformations: Secondary | ICD-10-CM | POA: Diagnosis not present

## 2016-09-25 DIAGNOSIS — Z3A27 27 weeks gestation of pregnancy: Secondary | ICD-10-CM | POA: Diagnosis not present

## 2016-09-25 DIAGNOSIS — Z3689 Encounter for other specified antenatal screening: Secondary | ICD-10-CM | POA: Diagnosis not present

## 2016-09-25 DIAGNOSIS — Z3482 Encounter for supervision of other normal pregnancy, second trimester: Secondary | ICD-10-CM | POA: Diagnosis not present

## 2016-09-25 DIAGNOSIS — Z23 Encounter for immunization: Secondary | ICD-10-CM | POA: Diagnosis not present

## 2016-11-23 DIAGNOSIS — Z3685 Encounter for antenatal screening for Streptococcus B: Secondary | ICD-10-CM | POA: Diagnosis not present

## 2016-11-23 LAB — OB RESULTS CONSOLE GBS: GBS: POSITIVE

## 2016-12-14 NOTE — H&P (Signed)
Elizabeth Williamson is a 26 y.o. female presenting for scheduled elective iol at term. Pt has been 4cm dil for past few weeks. Her dating is based on LMP and confirmed by first trimester [place]. She declined essential panel ; first trimester screen is neg. She is GBS positive with no allergies. Prenatal care has been benign  OB History    Gravida Para Term Preterm AB Living   4 1 1   2 1    SAB TAB Ectopic Multiple Live Births   1 1   0 1     Past Medical History:  Diagnosis Date  . Alopecia    got injections through dermatologist  . Migraine    rare   Past Surgical History:  Procedure Laterality Date  . NO PAST SURGERIES     Family History: family history includes Cancer in her maternal grandfather and paternal uncle; Diabetes in her maternal grandmother; Heart disease in her brother; Hypertension in her paternal grandmother. Social History:  reports that she quit smoking about 3 years ago. Her smoking use included Cigarettes. She has a 1.50 pack-year smoking history. She has never used smokeless tobacco. She reports that she drinks alcohol. She reports that she does not use drugs.     Maternal Diabetes: No Genetic Screening: Normal Maternal Ultrasounds/Referrals: Normal Fetal Ultrasounds or other Referrals:  None Maternal Substance Abuse:  No Significant Maternal Medications:  None Significant Maternal Lab Results:  Lab values include: Group B Strep positive Other Comments:  None  Review of Systems  Constitutional: Positive for malaise/fatigue. Negative for chills, fever and weight loss.  Eyes: Negative for blurred vision.  Respiratory: Negative for shortness of breath.   Cardiovascular: Positive for leg swelling. Negative for chest pain.  Gastrointestinal: Positive for abdominal pain. Negative for heartburn, nausea and vomiting.  Genitourinary: Negative for dysuria.  Musculoskeletal: Positive for back pain.  Skin: Negative for itching and rash.  Neurological: Negative for  dizziness, tingling, tremors and headaches.  Psychiatric/Behavioral: Negative for depression, hallucinations, substance abuse and suicidal ideas. The patient is not nervous/anxious.    Maternal Medical History:  Reason for admission: Nausea. iol at term  Contractions: Onset was 1 week or more ago.   Frequency: irregular.   Perceived severity is mild.    Fetal activity: Perceived fetal activity is normal.   Last perceived fetal movement was within the past hour.    Prenatal complications: no prenatal complications Prenatal Complications - Diabetes: none.      Last menstrual period 03/16/2016, not currently breastfeeding. Maternal Exam:  Uterine Assessment: Contraction strength is mild.  Contraction frequency is irregular.   Abdomen: Patient reports generalized tenderness.  Estimated fetal weight is AGA.   Fetal presentation: vertex  Introitus: Normal vulva. Vulva is negative for condylomata and lesion.  Normal vagina.  Vagina is negative for condylomata and discharge.  Pelvis: adequate for delivery.   Cervix: Cervix evaluated by digital exam.     Physical Exam  Constitutional: She is oriented to person, place, and time. She appears well-developed and well-nourished.  Neck: Normal range of motion.  Cardiovascular: Normal rate.   Respiratory: Effort normal.  GI: Soft. There is generalized tenderness.  Genitourinary: Vagina normal and uterus normal. Vulva exhibits no lesion. No vaginal discharge found.  Musculoskeletal: Normal range of motion.  Neurological: She is alert and oriented to person, place, and time.  Skin: Skin is warm.  Psychiatric: She has a normal mood and affect. Her behavior is normal. Judgment and thought content normal.  Prenatal labs: ABO, Rh:   Antibody:   Rubella:   RPR:    HBsAg:    HIV:    GBS:     Assessment/Plan: G3P1011 at 39 1/7wks for scheduled elective iol PCN for GBS treatment Pitocin per protocol AROM after 1-2 doses PCN Pain  control prn Anticipate svd   Janean SarkCecilia W Zhane Bluitt 12/14/2016, 8:39 PM

## 2016-12-15 ENCOUNTER — Inpatient Hospital Stay (HOSPITAL_COMMUNITY): Payer: 59 | Admitting: Anesthesiology

## 2016-12-15 ENCOUNTER — Inpatient Hospital Stay (HOSPITAL_COMMUNITY)
Admission: RE | Admit: 2016-12-15 | Discharge: 2016-12-17 | DRG: 775 | Disposition: A | Discharge status: Home / Self Care | Payer: 59 | Source: Ambulatory Visit | Attending: Obstetrics and Gynecology | Admitting: Obstetrics and Gynecology

## 2016-12-15 ENCOUNTER — Encounter (HOSPITAL_COMMUNITY): Payer: Self-pay | Admitting: *Deleted

## 2016-12-15 DIAGNOSIS — O99824 Streptococcus B carrier state complicating childbirth: Secondary | ICD-10-CM | POA: Diagnosis present

## 2016-12-15 DIAGNOSIS — Z3A39 39 weeks gestation of pregnancy: Secondary | ICD-10-CM | POA: Diagnosis not present

## 2016-12-15 DIAGNOSIS — O26893 Other specified pregnancy related conditions, third trimester: Secondary | ICD-10-CM | POA: Diagnosis present

## 2016-12-15 DIAGNOSIS — Z87891 Personal history of nicotine dependence: Secondary | ICD-10-CM

## 2016-12-15 DIAGNOSIS — O99613 Diseases of the digestive system complicating pregnancy, third trimester: Secondary | ICD-10-CM | POA: Diagnosis not present

## 2016-12-15 DIAGNOSIS — Z3490 Encounter for supervision of normal pregnancy, unspecified, unspecified trimester: Secondary | ICD-10-CM

## 2016-12-15 LAB — TYPE AND SCREEN
ABO/RH(D): O POS
Antibody Screen: NEGATIVE

## 2016-12-15 LAB — CBC
HCT: 31.8 % — ABNORMAL LOW (ref 36.0–46.0)
Hemoglobin: 10.3 g/dL — ABNORMAL LOW (ref 12.0–15.0)
MCH: 27.8 pg (ref 26.0–34.0)
MCHC: 32.4 g/dL (ref 30.0–36.0)
MCV: 85.7 fL (ref 78.0–100.0)
Platelets: 167 10*3/uL (ref 150–400)
RBC: 3.71 MIL/uL — ABNORMAL LOW (ref 3.87–5.11)
RDW: 15.4 % (ref 11.5–15.5)
WBC: 7 10*3/uL (ref 4.0–10.5)

## 2016-12-15 MED ORDER — ACETAMINOPHEN 325 MG PO TABS: 650.0000 mg | ORAL_TABLET | ORAL | Status: DC | PRN

## 2016-12-15 MED ORDER — TETANUS-DIPHTH-ACELL PERTUSSIS 5-2.5-18.5 LF-MCG/0.5 IM SUSP
0.5000 mL | Freq: Once | INTRAMUSCULAR | Status: DC
  Filled 2016-12-15: qty 0.5

## 2016-12-15 MED ORDER — DIPHENHYDRAMINE HCL 50 MG/ML IJ SOLN: 12.5000 mg | INTRAMUSCULAR | Status: DC | PRN

## 2016-12-15 MED ORDER — OXYCODONE-ACETAMINOPHEN 5-325 MG PO TABS: 1.0000 | ORAL_TABLET | ORAL | Status: DC | PRN

## 2016-12-15 MED ORDER — LACTATED RINGERS IV SOLN
500.0000 mL | INTRAVENOUS | Status: DC | PRN
Start: 2016-12-15 — End: 2016-12-15

## 2016-12-15 MED ORDER — EPHEDRINE 5 MG/ML INJ: 10.0000 mg | INTRAVENOUS | Status: DC | PRN

## 2016-12-15 MED ORDER — LIDOCAINE HCL (PF) 1 % IJ SOLN: 30.0000 mL | INTRAMUSCULAR | Status: DC | PRN

## 2016-12-15 MED ORDER — DIPHENHYDRAMINE HCL 25 MG PO CAPS: 25.0000 mg | ORAL_CAPSULE | Freq: Four times a day (QID) | ORAL | Status: DC | PRN

## 2016-12-15 MED ORDER — LACTATED RINGERS IV SOLN: INTRAVENOUS | Status: DC

## 2016-12-15 MED ORDER — FENTANYL 2.5 MCG/ML BUPIVACAINE 1/10 % EPIDURAL INFUSION (WH - ANES)
INTRAMUSCULAR | Status: AC
  Filled 2016-12-15: qty 100

## 2016-12-15 MED ORDER — ONDANSETRON HCL 4 MG/2ML IJ SOLN: 4.0000 mg | INTRAMUSCULAR | Status: DC | PRN

## 2016-12-15 MED ORDER — OXYCODONE-ACETAMINOPHEN 5-325 MG PO TABS: 2.0000 | ORAL_TABLET | ORAL | Status: DC | PRN

## 2016-12-15 MED ORDER — PENICILLIN G POT IN DEXTROSE 60000 UNIT/ML IV SOLN
3.0000 10*6.[IU] | INTRAVENOUS | Status: DC
  Administered 2016-12-15: 3 10*6.[IU] via INTRAVENOUS
  Filled 2016-12-15 (×4): qty 50

## 2016-12-15 MED ORDER — OXYTOCIN 40 UNITS IN LACTATED RINGERS INFUSION - SIMPLE MED
2.5000 [IU]/h | INTRAVENOUS | Status: DC
  Administered 2016-12-15 (×2): 2.5 [IU]/h via INTRAVENOUS

## 2016-12-15 MED ORDER — PHENYLEPHRINE 40 MCG/ML (10ML) SYRINGE FOR IV PUSH (FOR BLOOD PRESSURE SUPPORT): 80.0000 ug | PREFILLED_SYRINGE | INTRAVENOUS | Status: DC | PRN

## 2016-12-15 MED ORDER — PHENYLEPHRINE 40 MCG/ML (10ML) SYRINGE FOR IV PUSH (FOR BLOOD PRESSURE SUPPORT)
PREFILLED_SYRINGE | INTRAVENOUS | Status: AC
  Filled 2016-12-15: qty 20

## 2016-12-15 MED ORDER — PENICILLIN G POTASSIUM 5000000 UNITS IJ SOLR
5.0000 10*6.[IU] | Freq: Once | INTRAVENOUS | Status: AC
  Administered 2016-12-15: 5 10*6.[IU] via INTRAVENOUS
  Filled 2016-12-15: qty 5

## 2016-12-15 MED ORDER — LIDOCAINE HCL (PF) 1 % IJ SOLN
INTRAMUSCULAR | Status: DC | PRN
  Administered 2016-12-15 (×2): 5 mL via EPIDURAL

## 2016-12-15 MED ORDER — ONDANSETRON HCL 4 MG/2ML IJ SOLN
4.0000 mg | Freq: Four times a day (QID) | INTRAMUSCULAR | Status: DC | PRN
Start: 2016-12-15 — End: 2016-12-15

## 2016-12-15 MED ORDER — OXYTOCIN 40 UNITS IN LACTATED RINGERS INFUSION - SIMPLE MED
1.0000 m[IU]/min | INTRAVENOUS | Status: DC
  Administered 2016-12-15: 2 m[IU]/min via INTRAVENOUS
  Filled 2016-12-15: qty 1000

## 2016-12-15 MED ORDER — BUTORPHANOL TARTRATE 1 MG/ML IJ SOLN: 1.0000 mg | INTRAMUSCULAR | Status: DC | PRN

## 2016-12-15 MED ORDER — ONDANSETRON HCL 4 MG PO TABS: 4.0000 mg | ORAL_TABLET | ORAL | Status: DC | PRN

## 2016-12-15 MED ORDER — OXYTOCIN BOLUS FROM INFUSION
500.0000 mL | Freq: Once | INTRAVENOUS | Status: AC
  Administered 2016-12-15: 500 mL via INTRAVENOUS

## 2016-12-15 MED ORDER — ZOLPIDEM TARTRATE 5 MG PO TABS: 5.0000 mg | ORAL_TABLET | Freq: Every evening | ORAL | Status: DC | PRN

## 2016-12-15 MED ORDER — SIMETHICONE 80 MG PO CHEW: 80.0000 mg | CHEWABLE_TABLET | ORAL | Status: DC | PRN

## 2016-12-15 MED ORDER — OXYCODONE HCL 5 MG PO TABS
10.0000 mg | ORAL_TABLET | ORAL | Status: DC | PRN
Start: 2016-12-15 — End: 2016-12-17

## 2016-12-15 MED ORDER — SOD CITRATE-CITRIC ACID 500-334 MG/5ML PO SOLN: 30.0000 mL | ORAL | Status: DC | PRN

## 2016-12-15 MED ORDER — OXYCODONE HCL 5 MG PO TABS: 5.0000 mg | ORAL_TABLET | ORAL | Status: DC | PRN

## 2016-12-15 MED ORDER — FENTANYL 2.5 MCG/ML BUPIVACAINE 1/10 % EPIDURAL INFUSION (WH - ANES)
14.0000 mL/h | INTRAMUSCULAR | Status: DC | PRN
  Administered 2016-12-15: 14 mL/h via EPIDURAL

## 2016-12-15 MED ORDER — PRENATAL MULTIVITAMIN CH
1.0000 | ORAL_TABLET | Freq: Every day | ORAL | Status: DC
  Administered 2016-12-16: 1 via ORAL
  Filled 2016-12-15: qty 1

## 2016-12-15 MED ORDER — WITCH HAZEL-GLYCERIN EX PADS: 1.0000 "application " | MEDICATED_PAD | CUTANEOUS | Status: DC | PRN

## 2016-12-15 MED ORDER — IBUPROFEN 600 MG PO TABS
600.0000 mg | ORAL_TABLET | Freq: Four times a day (QID) | ORAL | Status: DC
  Administered 2016-12-15 – 2016-12-17 (×7): 600 mg via ORAL
  Filled 2016-12-15 (×7): qty 1

## 2016-12-15 MED ORDER — BENZOCAINE-MENTHOL 20-0.5 % EX AERO: 1.0000 "application " | INHALATION_SPRAY | CUTANEOUS | Status: DC | PRN

## 2016-12-15 MED ORDER — DIBUCAINE 1 % RE OINT: 1.0000 "application " | TOPICAL_OINTMENT | RECTAL | Status: DC | PRN

## 2016-12-15 MED ORDER — TERBUTALINE SULFATE 1 MG/ML IJ SOLN: 0.2500 mg | Freq: Once | INTRAMUSCULAR | Status: DC | PRN

## 2016-12-15 MED ORDER — SENNOSIDES-DOCUSATE SODIUM 8.6-50 MG PO TABS
2.0000 | ORAL_TABLET | ORAL | Status: DC
  Administered 2016-12-16 – 2016-12-17 (×2): 2 via ORAL
  Filled 2016-12-15 (×2): qty 2

## 2016-12-15 MED ORDER — LACTATED RINGERS IV SOLN: 500.0000 mL | Freq: Once | INTRAVENOUS | Status: DC

## 2016-12-15 MED ORDER — COCONUT OIL OIL: 1.0000 "application " | TOPICAL_OIL | Status: DC | PRN

## 2016-12-15 NOTE — Progress Notes (Signed)
Patient ID: Elizabeth Williamson, female   DOB: 1990/12/04, 26 y.o.   MRN: [id number] Pt progressed to complete with uncontrollable urge to push. See delivery note

## 2016-12-15 NOTE — Anesthesia Procedure Notes (Signed)
Epidural Patient location during procedure: OB Start time: 12/15/2016 1:45 PM End time: 12/15/2016 1:50 PM  Staffing Anesthesiologist: Elysabeth Aust  Preanesthetic Checklist Completed: patient identified, site marked, surgical consent, pre-op evaluation, timeout performed, IV checked, risks and benefits discussed and monitors and equipment checked  Epidural Patient position: sitting Prep: site prepped and draped and DuraPrep Patient monitoring: continuous pulse ox and blood pressure Approach: midline Location: L3-L4 Injection technique: LOR air  Needle:  Needle type: Tuohy  Needle gauge: 17 G Needle length: 9 cm and 9 Needle insertion depth: 6 cm Catheter type: closed end flexible Catheter size: 19 Gauge Catheter at skin depth: 11 cm Test dose: negative  Assessment Events: blood not aspirated, injection not painful, no injection resistance, negative IV test and no paresthesia

## 2016-12-15 NOTE — Anesthesia Pain Management Evaluation Note (Signed)
  CRNA Pain Management Visit Note  Patient: Elizabeth Williamson, 26 y.o., female  "Hello I am a member of the anesthesia team at [facility]. We have an anesthesia team available at all times to provide care throughout the hospital, including epidural management and anesthesia for C-section. I don't know your plan for the delivery whether it a natural birth, water birth, IV sedation, nitrous supplementation, doula or epidural, but we want to meet your pain goals."   1.Was your pain managed to your expectations on prior hospitalizations?   Yes   2.What is your expectation for pain management during this hospitalization?     Epidural  3.How can we help you reach that goal? Epidural when pain threshold reached.  Record the patient's initial score and the patient's pain goal.   Pain: 4  Pain Goal: 8 [facility] wants you to be able to say your pain was always managed very well.  [name] 12/15/2016

## 2016-12-15 NOTE — Anesthesia Preprocedure Evaluation (Signed)

## 2016-12-15 NOTE — Progress Notes (Signed)
Patient ID: Elizabeth Williamson, female   DOB: 1990/09/05, 26 y.o.   MRN: [id number] Pt doing well. Appreciating contractions mildly but more frequently VSS EFM - cat 1, 145 TOCO - ctxs q 2-593mins SVE - 5/90/-2  A/P: Progressing well in labor on pitocin         AROM performed with clear fluid noted- scant         Pain control prn         S/P first dose PCN for GBS tx         Anticipate svd

## 2016-12-15 NOTE — Progress Notes (Signed)
Patient ID: Elizabeth Williamson, female   DOB: 05/05/1990, 26 y.o.   MRN: [id number] Pt doing well - comfortable with epidural. Tol pitocin VSS EFM - cat 1, 130 TOCO - ctxs q 1-443mins SVE - 8/90/0  A/P: Progressing well in labor; multip         Recheck prn         Anticipate svd

## 2016-12-16 LAB — CBC
HEMATOCRIT: 29.3 % — AB (ref 36.0–46.0)
HEMOGLOBIN: 9.6 g/dL — AB (ref 12.0–15.0)
MCH: 27.8 pg (ref 26.0–34.0)
MCHC: 32.8 g/dL (ref 30.0–36.0)
MCV: 84.9 fL (ref 78.0–100.0)
Platelets: 156 10*3/uL (ref 150–400)
RBC: 3.45 MIL/uL — AB (ref 3.87–5.11)
RDW: 15.5 % (ref 11.5–15.5)
WBC: 10 10*3/uL (ref 4.0–10.5)

## 2016-12-16 LAB — RPR: RPR: NONREACTIVE

## 2016-12-16 NOTE — Progress Notes (Addendum)
Patient ID: Elizabeth Williamson, female   DOB: 07-Sep-1990, 26 y.o.   MRN: [id number] Pt doing well. +Crampiing with breastfeeding as expected otherwise none; lochia mild. No fever, chills, HA or CP. Ambulating and tolerating diet well. Bonding well with baby VSS ABD- soft, FF EXT - no Homans  111-116/72-73  A/P: PPD#2 s/p svd - stable         Routine pp care         Discharge to home tomorrow

## 2016-12-16 NOTE — Anesthesia Postprocedure Evaluation (Signed)
Anesthesia Post Note  Patient: Elizabeth Williamson  Procedure(s) Performed: * No procedures listed *     Patient location during evaluation: Mother Baby Anesthesia Type: Epidural Level of consciousness: awake and alert and oriented Pain management: satisfactory to patient Vital Signs Assessment: post-procedure vital signs reviewed and stable Respiratory status: spontaneous breathing and nonlabored ventilation Cardiovascular status: stable Postop Assessment: no headache, no backache, no signs of nausea or vomiting, adequate PO intake and patient able to bend at knees (patient up walking) Anesthetic complications: no    Last Vitals:  Vitals:   12/16/16 0230 12/16/16 0513  BP: 116/72 114/72  Pulse: 80 80  Resp: 18 18  Temp: 36.8 C 36.7 C  SpO2: 98%     Last Pain:  Vitals:   12/16/16 0745  TempSrc:   PainSc: 2    Pain Goal:                 Madison HickmanGREGORY,Roslind Michaux

## 2016-12-16 NOTE — Lactation Note (Signed)
This note was copied from a baby's chart. Lactation Consultation Note Mom has 3116 month old that she BF for 2 weeks. Mom has full breast w/good everted nipples. Easily expressed colostrum. Baby BF when LC entered rm. Mom had baby in a siting football position. Suggested using pillows for support and comfort. Mom stated much better. Mom denied painful latch.  Reviewed BF 8-12 times a day. Encouraged STS, I&O.  Encouraged to call for assistance or questions if needed.  [facility]/LC brochure given w/resources, support groups and LC services. Patient Name: Elizabeth Williamson ZOXWR'UToday's Date: 12/16/2016 Reason for consult: Initial assessment   Maternal Data Has patient been taught Hand Expression?: Yes Does the patient have breastfeeding experience prior to this delivery?: Yes  Feeding Feeding Type: Breast Fed Length of feed: 20 min  LATCH Score Latch: Grasps breast easily, tongue down, lips flanged, rhythmical sucking.  Audible Swallowing: Spontaneous and intermittent  Type of Nipple: Everted at rest and after stimulation  Comfort (Breast/Nipple): Soft / non-tender  Hold (Positioning): Assistance needed to correctly position infant at breast and maintain latch.  LATCH Score: 9  Interventions Interventions: Support pillows;Breast feeding basics reviewed;Position options;Breast massage;Breast compression  Lactation Tools Discussed/Used     Consult Status Consult Status: Follow-up Date: 12/17/16 Follow-up type: In-patient    Charyl DancerCARVER, Loghan Subia G 12/16/2016, 5:13 AM

## 2016-12-17 MED ORDER — IBUPROFEN 600 MG PO TABS: 600.0000 mg | ORAL_TABLET | Freq: Four times a day (QID) | ORAL | 0 refills | Status: DC

## 2016-12-17 NOTE — Progress Notes (Signed)
Post Partum Day 2 Subjective: no complaints, up ad lib and tolerating PO  Objective: Blood pressure 110/70, pulse 75, temperature 98.4 F (36.9 C), temperature source Oral, resp. rate 18, last menstrual period 03/16/2016, SpO2 99 %, unknown if currently breastfeeding.  Physical Exam:  General: alert and cooperative Lochia: appropriate Uterine Fundus: firm   Recent Labs  12/15/16 0830 12/16/16 0521  HGB 10.3* 9.6*  HCT 31.8* 29.3*    Assessment/Plan: Discharge home   LOS: 2 days   Oliver PilaKathy W Donyel Nester 12/17/2016, 9:52 AM

## 2016-12-17 NOTE — Discharge Summary (Signed)
OB Discharge Summary     Patient Name: Elizabeth Williamson DOB: 06-07-1990 MRN: [id number]  Date of admission: 12/15/2016 Delivering MD: [facility]   Date of discharge: 12/17/2016  Admitting diagnosis: 39 WK INDUCTION Intrauterine pregnancy: [redacted]w[redacted]d     Secondary diagnosis:  Active Problems:   Term pregnancy   SVD (spontaneous vaginal delivery)   Postpartum care following vaginal delivery  Additional problems: none     Discharge diagnosis: Term Pregnancy Delivered                                                                                                Post partum procedures:none  Augmentation: AROM and Pitocin  Complications: None  Hospital course:  Induction of Labor With Vaginal Delivery   26 y.o. yo [id number] at [redacted]w[redacted]d was admitted to the hospital 12/15/2016 for induction of labor.  Indication for induction: Favorable cervix at term.  Patient had an uncomplicated labor course as follows: Membrane Rupture Time/Date: 12:12 PM ,12/15/2016   Intrapartum Procedures: Episiotomy: None [1]                                         Lacerations:  None [1]  Patient had delivery of a Viable infant.  Information for the patient's newborn:  Leoma, Folds [[id number]]  Delivery Method: Vag-Spont   12/15/2016  Details of delivery can be found in separate delivery note.  Patient had a routine postpartum course. Patient is discharged home 12/17/16.  Physical exam  Vitals:   12/16/16 0513 12/16/16 1005 12/16/16 2201 12/17/16 0517  BP: 114/72 111/73 112/68 110/70  Pulse: 80 90 84 75  Resp: 18 18  18   Temp: 98.1 F (36.7 C) 98.5 F (36.9 C) 97.7 F (36.5 C) 98.4 F (36.9 C)  TempSrc: Oral Oral Oral Oral  SpO2:   99%    General: alert and cooperative Lochia: appropriate Uterine Fundus: firm   Labs: Lab Results  Component Value Date   WBC 10.0 12/16/2016   HGB 9.6 (L) 12/16/2016   HCT 29.3 (L) 12/16/2016   MCV 84.9 12/16/2016   PLT 156 12/16/2016   CMP Latest Ref  Rng & Units 05/04/2016  Glucose 65 - 99 mg/dL 130(Q)  BUN 6 - 20 mg/dL 5(L)  Creatinine 6.57 - 1.00 mg/dL 8.46  Sodium 962 - 952 mmol/L 139  Potassium 3.5 - 5.2 mmol/L 4.2  Chloride 96 - 106 mmol/L 103  CO2 18 - 29 mmol/L 18  Calcium 8.7 - 10.2 mg/dL 9.3  Total Protein 6.0 - 8.5 g/dL 7.5  Total Bilirubin 0.0 - 1.2 mg/dL <8.4  Alkaline Phos 39 - 117 IU/L 66  AST 0 - 40 IU/L 23  ALT 0 - 32 IU/L 27    Discharge instruction: per After Visit Summary and "Baby and Me Booklet".  After visit meds:  Allergies as of 12/17/2016   No Known Allergies     Medication List    TAKE these medications   albuterol 108 (90 Base) MCG/ACT  inhaler Commonly known as:  PROAIR HFA Inhale 1-2 puffs into the lungs every 4 (four) hours as needed.   docusate sodium 100 MG capsule Commonly known as:  COLACE Take 100 mg by mouth daily as needed for mild constipation.   ibuprofen 600 MG tablet Commonly known as:  ADVIL,MOTRIN Take 1 tablet (600 mg total) by mouth every 6 (six) hours.   prenatal multivitamin Tabs tablet Take 1 tablet by mouth daily at 12 noon.            Discharge Care Instructions        Start     Ordered   12/17/16 0000  ibuprofen (ADVIL,MOTRIN) 600 MG tablet  Every 6 hours     12/17/16 0953   12/17/16 0000  Diet - low sodium heart healthy     12/17/16 0953   12/17/16 0000  Discharge instructions    Comments:  Nothing in vagina for 6 weeks.  No sex, tampons, and douching.  Other instructions as in [facility].   12/17/16 0953   12/15/16 0000  OB RESULT CONSOLE Group B Strep    Comments:  This external order was created through the Results Console.   12/15/16 0802   12/15/16 0000  OB RESULTS CONSOLE GC/Chlamydia    Comments:  This external order was created through the Results Console.   12/15/16 0802   12/15/16 0000  OB RESULTS CONSOLE RPR    Comments:  This external order was created through the Results Console.    12/15/16 0802   12/15/16  0000  OB RESULTS CONSOLE HIV antibody    Comments:  This external order was created through the Results Console.    12/15/16 0802   12/15/16 0000  OB RESULTS CONSOLE Rubella Antibody    Comments:  This external order was created through the Results Console.    12/15/16 0802   12/15/16 0000  OB RESULTS CONSOLE Hepatitis B surface antigen    Comments:  This external order was created through the Results Console.    12/15/16 0802   12/15/16 0000  OB RESULTS CONSOLE ABO/Rh    Comments:  This external order was created through the Results Console.    12/15/16 0802   12/15/16 0000  OB RESULTS CONSOLE Antibody Screen    Comments:  This external order was created through the Results Console.    12/15/16 0802      Diet: routine diet  Activity: Advance as tolerated. Pelvic rest for 6 weeks.   Outpatient follow up:2 weeks Follow up Appt:No future appointments. Follow up Visit:No Follow-up on file.  Postpartum contraception: Undecided  Newborn Data: Live born female  Birth Weight: 7 lb 7.8 oz (3396 g) APGAR: 9, 10  Baby Feeding: Breast Disposition:home with mother   12/17/2016 Oliver PilaKathy W Klara Stjames, MD

## 2016-12-17 NOTE — Lactation Note (Signed)
This note was copied from a baby's chart. Lactation Consultation Note  Patient Name: Elizabeth Harvie HeckMelanie Kotlyar [id number]'JToday's Date: 12/17/2016 Reason for consult: Follow-up assessment;Infant weight loss;Term (6% weight loss )  Baby is 7139 hours old and has been consistent with feedings ( see doc flow sheets )  Baby hungry when LC present. LC reviewed basics of latching and depth at the breast.  And answered all moms questions ( breast feeding ) mom denies soreness, sore nipple and  Engorgement prevention and tx reviewed. LC instructed on the use hand pump and shells for ( areola edema)  Per mom has a DEBP ( Medela at home ) . And LC provided 2- #27 flanges for when the milk comes in.  Baby latched well with depth and fed 20 mins, multiple swallows , increased with breast compressions,  And nipple well rounded when the baby released. Per mom comfortable with feeding.  Mother informed of post-discharge support and given phone number to the lactation department, including services for phone call assistance; out-patient appointments; and breastfeeding support group. List of other breastfeeding resources in the community given in the handout. Encouraged mother to call for problems or concerns related to breastfeeding.   Maternal Data Has patient been taught Hand Expression?: Yes (mom demo hand expressing well , with several drops noted ) Does the patient have breastfeeding experience prior to this delivery?: Yes  Feeding Feeding Type: Breast Fed Length of feed: 20 min (multiple swallows, increased with breast compressions )  LATCH Score Latch: Grasps breast easily, tongue down, lips flanged, rhythmical sucking.  Audible Swallowing: Spontaneous and intermittent  Type of Nipple: Everted at rest and after stimulation  Comfort (Breast/Nipple): Soft / non-tender  Hold (Positioning): Assistance needed to correctly position infant at breast and maintain latch.  LATCH Score: 9  Interventions Interventions:  Breast feeding basics reviewed;Assisted with latch;Skin to skin;Breast massage;Hand express;Reverse pressure;Breast compression;Adjust position;Support pillows;Position options;Expressed milk;Shells;Hand pump  Lactation Tools Discussed/Used Tools: Shells;Flanges Flange Size: 27;Other (comment);24 (#24 F for no , and the #27 for when the milk comes in ) Shell Type: Inverted WIC Program: No Pump Review: Setup, frequency, and cleaning;Milk Storage Initiated by:: MAI  Date initiated:: 12/17/16   Consult Status Consult Status: Complete Date: 12/17/16    Kathrin GreathouseMargaret Ann Jaymian Bogart 12/17/2016, 9:48 AM

## 2016-12-21 ENCOUNTER — Telehealth (HOSPITAL_COMMUNITY): Payer: Self-pay | Admitting: Lactation Services

## 2016-12-21 NOTE — Telephone Encounter (Signed)
Elizabeth Williamson had a Lactation Outpatient appointment yesterday.  Baby is 506 days old today.  Instructed to pump after breastfeeding 4 times a day to stimulate her milk supply.  One breast is significantly a low producer, only expressed 2-3 ml. Baby is gaining well, as her other breast is producing well. Today, baby is nursing better on both breasts now.  Reinforced importance of continuing to pump 4 times a day and suggested she could place warm compresses, massage breasts prior to double pumping.   Elizabeth Williamson to attend Tuesday's Breastfeeding Support Group, weigh baby, and make another follow-up appointment if baby's weight isn't on target.  Elizabeth Williamson to call prn for any other questions.

## 2017-01-14 DIAGNOSIS — Z30017 Encounter for initial prescription of implantable subdermal contraceptive: Secondary | ICD-10-CM | POA: Diagnosis not present

## 2017-01-30 DIAGNOSIS — Z1389 Encounter for screening for other disorder: Secondary | ICD-10-CM | POA: Diagnosis not present

## 2017-01-30 DIAGNOSIS — Z3049 Encounter for surveillance of other contraceptives: Secondary | ICD-10-CM | POA: Diagnosis not present

## 2017-01-30 DIAGNOSIS — Z3009 Encounter for other general counseling and advice on contraception: Secondary | ICD-10-CM | POA: Diagnosis not present

## 2017-02-07 ENCOUNTER — Telehealth: Payer: Self-pay | Admitting: Family Medicine

## 2017-02-07 NOTE — Telephone Encounter (Signed)
Called pt time for cpe.  Reached Presenter, broadcastingvoice mail [email or web address].

## 2017-05-08 ENCOUNTER — Encounter (HOSPITAL_COMMUNITY): Payer: Self-pay | Admitting: Emergency Medicine

## 2017-05-08 ENCOUNTER — Other Ambulatory Visit: Payer: Self-pay

## 2017-05-08 ENCOUNTER — Emergency Department (HOSPITAL_COMMUNITY)
Admission: EM | Admit: 2017-05-08 | Discharge: 2017-05-08 | Disposition: A | Discharge status: Home / Self Care | Payer: 59 | Attending: Emergency Medicine | Admitting: Emergency Medicine

## 2017-05-08 DIAGNOSIS — J029 Acute pharyngitis, unspecified: Secondary | ICD-10-CM | POA: Insufficient documentation

## 2017-05-08 DIAGNOSIS — Z79899 Other long term (current) drug therapy: Secondary | ICD-10-CM | POA: Insufficient documentation

## 2017-05-08 DIAGNOSIS — J4520 Mild intermittent asthma, uncomplicated: Secondary | ICD-10-CM | POA: Insufficient documentation

## 2017-05-08 DIAGNOSIS — Z87891 Personal history of nicotine dependence: Secondary | ICD-10-CM | POA: Insufficient documentation

## 2017-05-08 LAB — RAPID STREP SCREEN (MED CTR MEBANE ONLY): Streptococcus, Group A Screen (Direct): NEGATIVE

## 2017-05-08 MED ORDER — PREDNISONE 20 MG PO TABS
40.0000 mg | ORAL_TABLET | Freq: Once | ORAL | Status: AC
  Administered 2017-05-08: 40 mg via ORAL
  Filled 2017-05-08: qty 2

## 2017-05-08 NOTE — ED Triage Notes (Signed)
Pt in from home with c/o sore throat, cough x 1.5 wks. Denies any fevers, states she has been taking Sudafed with no relief

## 2017-05-08 NOTE — Discharge Instructions (Signed)
Today your rapid strep test was negative.  Please make sure that you are staying well-hydrated.  Please take Ibuprofen (Advil, motrin) and Tylenol (acetaminophen) to relieve your pain.  You may take up to 600 MG (3 pills) of normal strength ibuprofen every 8 hours as needed.  In between doses of ibuprofen you make take tylenol, up to 1,000 mg (two extra strength pills).  Do not take more than 3,000 mg tylenol in a 24 hour period.  Please check all medication labels as many medications such as pain and cold medications may contain tylenol.  Do not drink alcohol while taking these medications.  Do not take other NSAID'S while taking ibuprofen (such as aleve or naproxen).  Please take ibuprofen with food to decrease stomach upset.

## 2017-05-08 NOTE — ED Provider Notes (Signed)
[facility] EMERGENCY DEPARTMENT Provider Note   CSN: [id number] Arrival date & time: 05/08/17  1615     History   Chief Complaint Chief Complaint  Patient presents with  . Sore Throat    HPI Elizabeth Williamson is a 27 y.o. female with a history of alopecia who presents today for evaluation of sore throat.  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  She reports that for the past week she has had a sore throat that waxes and wanes.  She has been trying Sudafed without significant relief.  She reports that her voice has been hoarse, denies shortness of breath, difficulty breathing, or drooling.  Reports that she is also taking Tylenol for her symptoms.  Denies any fevers at home.  HPI  Past Medical History:  Diagnosis Date  . Alopecia    got injections through dermatologist  . Migraine    rare    Patient Active Problem List   Diagnosis Date Noted  . Term pregnancy 12/15/2016  . SVD (spontaneous vaginal delivery) 12/15/2016  . Postpartum care following vaginal delivery 12/15/2016  . NSVD (normal spontaneous vaginal delivery) 10/14/2015  . Active labor 10/11/2015  . Group B Streptococcus carrier, antepartum 09/17/2015  . Asthma, mild intermittent 05/06/2015  . Allergic rhinitis 05/06/2015  . Supervision of normal first pregnancy 05/06/2015  . Panic attack 12/30/2013  . Gastroesophageal reflux disease without esophagitis 11/09/2013    Past Surgical History:  Procedure Laterality Date  . NO PAST SURGERIES      OB History    Gravida Para Term Preterm AB Living   3 2 2   1 2    SAB TAB Ectopic Multiple Live Births   1 0   0 2       Home Medications    Prior to Admission medications   Medication Sig Start Date End Date Taking? Authorizing Provider  albuterol (PROAIR HFA) 108 (90 BASE) MCG/ACT inhaler Inhale 1-2 puffs into the lungs every 4 (four) hours as needed. Patient not taking:  Reported on 11/16/2015 07/19/14   [name], MD  docusate sodium (COLACE) 100 MG capsule Take 100 mg by mouth daily as needed for mild constipation.    [provider]  ibuprofen (ADVIL,MOTRIN) 600 MG tablet Take 1 tablet (600 mg total) by mouth every 6 (six) hours. 12/17/16   Huel Cote, MD  Prenatal Vit-Fe Fumarate-FA (PRENATAL MULTIVITAMIN) TABS tablet Take 1 tablet by mouth daily at 12 noon.    [provider]    Family History Family History  Problem Relation Age of Onset  . Heart disease Brother        recently found to have enlarged heart (age 48)  . Diabetes Maternal Grandmother   . Cancer Maternal Grandfather        colon cancer (60's)  . Hypertension Paternal Grandmother   . Cancer Paternal Uncle        lung cancer (nonsmoker)    Social History Social History   Tobacco Use  . Smoking status: Former Smoker    Packs/day: 0.50    Years: 3.00    Pack years: 1.50    Types: Cigarettes    Last attempt to quit: 08/14/2013    Years since quitting: 3.7  . Smokeless tobacco: Never Used  . Tobacco comment: increased to 3 pack/week  Substance Use Topics  . Alcohol use: Yes    Alcohol/week: 0.0 oz  Comment: Occasional social drinker, every other weekend  . Drug use: No     Allergies   Patient has no known allergies.   Review of Systems Review of Systems  Constitutional: Negative for chills and fever.  HENT: Positive for postnasal drip, rhinorrhea (Started yesterday) and sore throat. Negative for ear discharge, ear pain and sinus pressure.        Right ear fullness  Respiratory: Positive for cough. Negative for shortness of breath.   Gastrointestinal: Negative for nausea and vomiting.  Skin: Negative for rash.  Neurological: Negative for headaches.  All other systems reviewed and are negative.    Physical Exam Updated Vital Signs BP 114/76 (BP Location: Right Arm)   Pulse 84   Temp 98.4 F (36.9 C) (Oral)   Resp 18   Ht 5\' 6"  (1.676 m)    Wt 81.6 kg (180 lb)   SpO2 100%   BMI 29.05 kg/m   Physical Exam  Constitutional: She is oriented to person, place, and time. She appears well-developed and well-nourished. No distress.  HENT:  Head: Normocephalic and atraumatic.  Right Ear: Tympanic membrane, external ear and ear canal normal.  Left Ear: Tympanic membrane, external ear and ear canal normal.  Nose: Mucosal edema and rhinorrhea present.  Mouth/Throat: Uvula is midline and mucous membranes are normal. No trismus in the jaw. No dental abscesses or uvula swelling. Posterior oropharyngeal erythema present. No oropharyngeal exudate. Tonsils are 2+ on the right. Tonsils are 2+ on the left. No tonsillar exudate.  Eyes: Conjunctivae are normal. No scleral icterus.  Neck: Normal range of motion. Neck supple.  Cardiovascular: Normal rate and regular rhythm.  Pulmonary/Chest: Effort normal and breath sounds normal. No respiratory distress. She has no wheezes.  Lymphadenopathy:    She has no cervical adenopathy.  Neurological: She is alert and oriented to person, place, and time.  Skin: Skin is warm and dry. She is not diaphoretic.  Nursing note and vitals reviewed.    ED Treatments / Results  Labs (all labs ordered are listed, but only abnormal results are displayed) Labs Reviewed  RAPID STREP SCREEN (NOT AT [facility])  CULTURE, GROUP A STREP [facility])    EKG  EKG Interpretation None       Radiology No results found.  Procedures Procedures (including critical care time)  Medications Ordered in ED Medications  predniSONE (DELTASONE) tablet 40 mg (40 mg Oral Given 05/08/17 1834)     Initial Impression / Assessment and Plan / ED Course  I have reviewed the triage vital signs and the nursing notes.  Pertinent labs & imaging results that were available during my care of the patient were reviewed by me and considered in my medical decision making (see chart for details).     Patients symptoms are consistent with URI,  likely viral etiology. Discussed that antibiotics are not indicated for viral infections. Pt will be discharged with symptomatic treatment.  Verbalizes understanding and is agreeable with plan. Pt is hemodynamically stable & in NAD prior to dc.   Final Clinical Impressions(s) / ED Diagnoses   Final diagnoses:  Sore throat    ED Discharge Orders    None       Norman ClayHammond, Kaia Depaolis W, PA-C 05/08/17 Delila [id number]    Long, Joshua G, MD 05/09/17 [phone]

## 2017-05-11 LAB — CULTURE, GROUP A STREP (THRC)

## 2017-05-22 ENCOUNTER — Ambulatory Visit: Payer: Self-pay | Admitting: Emergency Medicine

## 2017-05-22 VITALS — BP 108/61 | HR 98 | Temp 99.1°F | Resp 16

## 2017-05-22 DIAGNOSIS — J029 Acute pharyngitis, unspecified: Secondary | ICD-10-CM

## 2017-05-22 LAB — POCT RAPID STREP A (OFFICE): RAPID STREP A SCREEN: NEGATIVE

## 2017-05-22 MED ORDER — AMOXICILLIN 875 MG PO TABS: 875.0000 mg | ORAL_TABLET | Freq: Two times a day (BID) | ORAL | 0 refills | Status: DC

## 2017-05-22 MED ORDER — BENZONATATE 100 MG PO CAPS: 100.0000 mg | ORAL_CAPSULE | Freq: Three times a day (TID) | ORAL | 0 refills | Status: DC | PRN

## 2017-05-22 MED ORDER — FLUTICASONE PROPIONATE 50 MCG/ACT NA SUSP: 2.0000 | Freq: Every day | NASAL | 6 refills | Status: DC

## 2017-05-22 NOTE — Patient Instructions (Signed)

## 2017-05-22 NOTE — Progress Notes (Signed)
Subjective. Patient has a four-week history of sinus pressure and cough. She recently has had discomfort in her right ear and feels the glands on the right side of her neck are swollen. Her cough is productive of a yellow-green phlegm. Her nasal drainage also has a yellow-green color to it. She has a history of asthma when she was young but no recent problems. She is overall in good health and a nonsmoker. Strep screen done 05/08/2017 was negative she was seen in the emergency room and diagnosed viral illness. Objective. Patient is alert cooperative and in no distress. Nose is congested with redness bilaterally. Left TM is normal. Right TM appears to have fluid behind the drum. Neck exam reveals a tender right anterior cervical node. Chest revealed rhonchi no rales heard breath sounds symmetrical. Heart regular rate and rhythm no murmurs. Rapid strep negative. Assessment. Patient has a one-month history of head congestion and sinus pressure and a productive cough. She is a nonsmoker. Her lymph node has recently been swollen associated with her right-sided next pain. She has nexplanon for birth control. Plan. Amoxicillin 875 twice a day for 10 days. Flonase nasal spray. Advised Mucinex twice a day. Tessalon Perles for cough. Recheck in one week if not improving. Advised CBC and EBV titers if symptoms are persistent. .Marland Kitchen

## 2017-09-27 ENCOUNTER — Ambulatory Visit: Payer: Self-pay | Admitting: Family Medicine

## 2017-09-27 VITALS — BP 105/60 | HR 70 | Temp 98.3°F | Resp 16 | Ht 66.0 in | Wt 156.6 lb

## 2017-09-27 DIAGNOSIS — Z0000 Encounter for general adult medical examination without abnormal findings: Secondary | ICD-10-CM

## 2017-09-27 NOTE — Progress Notes (Signed)
Subjective: Annual wellness exam Patient presents for her annual wellness exam. Patient reports eating a healthy, well rounded diet and getting regular physical activity.  Patient regularly sees her primary care provider.  Patient denies any medical history or taking any medications other than a prenatal multivitamin. PCP: Dr. Lynelle DoctorKnapp. 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 Patient denies any other issues or concerns.   Review of Systems Unremarkable  Objective  Physical Exam General: Awake, alert and oriented. No acute distress. Well developed, hydrated and nourished. Appears stated age.  HEENT: Supple neck without adenopathy. Sclera is non-icteric. The ear canal is clear without discharge. The tympanic membrane is normal in appearance with normal landmarks and cone of light. Nasal mucosa is pink and moist. Oral mucosa is pink and moist. The pharynx is normal in appearance without tonsillar swelling or exudates.  Skin: Skin in warm, dry and intact without rashes or lesions. Appropriate color for ethnicity. Cardiac: Heart rate and rhythm are normal. No murmurs, gallops, or rubs are auscultated.  Respiratory: The chest wall is symmetric and without deformity. No signs of respiratory distress. Lung sounds are clear in all lobes bilaterally without rales, ronchi, or wheezes.  Abdomen: soft, non-tender; no masses,  no organomegaly. Neurological: The patient is awake, alert and oriented to person, place, and time with normal speech.  Memory is normal and thought processes intact. No gait abnormalities are appreciated.  Psychiatric: Appropriate mood and affect.   Assessment Annual wellness exam  Plan  Executive panel pending. Encouraged routine visits with primary care provider.  Encouraged patient to get regular exercise and eat a healthy, well-rounded diet.

## 2017-09-28 LAB — CMP12+LP+TP+TSH+6AC+CBC/D/PLT
A/G RATIO: 1.8 (ref 1.2–2.2)
ALK PHOS: 61 IU/L (ref 39–117)
ALT: 12 IU/L (ref 0–32)
AST: 14 IU/L (ref 0–40)
Albumin: 4.4 g/dL (ref 3.5–5.5)
BASOS: 1 %
BUN/Creatinine Ratio: 16 (ref 9–23)
BUN: 12 mg/dL (ref 6–20)
Basophils Absolute: 0 10*3/uL (ref 0.0–0.2)
Bilirubin Total: 0.2 mg/dL (ref 0.0–1.2)
CHOLESTEROL TOTAL: 187 mg/dL (ref 100–199)
CREATININE: 0.73 mg/dL (ref 0.57–1.00)
Calcium: 9.5 mg/dL (ref 8.7–10.2)
Chloride: 105 mmol/L (ref 96–106)
Chol/HDL Ratio: 2.2 ratio (ref 0.0–4.4)
EOS (ABSOLUTE): 0.2 10*3/uL (ref 0.0–0.4)
Eos: 6 %
Estimated CHD Risk: <0.5 times avg. (ref 0.0–1.0)
FREE THYROXINE INDEX: 1.7 (ref 1.2–4.9)
GFR calc Af Amer: 129 mL/min/{1.73_m2} (ref >59)
GFR, EST NON AFRICAN AMERICAN: 112 mL/min/{1.73_m2} (ref >59)
GGT: 5 IU/L (ref 0–60)
GLUCOSE: 72 mg/dL (ref 65–99)
Globulin, Total: 2.4 g/dL (ref 1.5–4.5)
HDL: 86 mg/dL (ref >39)
HEMATOCRIT: 38.7 % (ref 34.0–46.6)
Hemoglobin: 12.6 g/dL (ref 11.1–15.9)
IRON: 74 ug/dL (ref 27–159)
Immature Grans (Abs): 0 10*3/uL (ref 0.0–0.1)
Immature Granulocytes: 0 %
LDH: 153 IU/L (ref 119–226)
LDL Calculated: 94 mg/dL (ref 0–99)
LYMPHS ABS: 1.7 10*3/uL (ref 0.7–3.1)
Lymphs: 49 %
MCH: 27.8 pg (ref 26.6–33.0)
MCHC: 32.6 g/dL (ref 31.5–35.7)
MCV: 85 fL (ref 79–97)
MONOS ABS: 0.3 10*3/uL (ref 0.1–0.9)
Monocytes: 9 %
NEUTROS ABS: 1.2 10*3/uL — AB (ref 1.4–7.0)
Neutrophils: 35 %
POTASSIUM: 4.4 mmol/L (ref 3.5–5.2)
Phosphorus: 4.2 mg/dL (ref 2.5–4.5)
Platelets: 213 10*3/uL (ref 150–450)
RBC: 4.54 x10E6/uL (ref 3.77–5.28)
RDW: 13.5 % (ref 12.3–15.4)
SODIUM: 141 mmol/L (ref 134–144)
T3 UPTAKE RATIO: 25 % (ref 24–39)
T4, Total: 6.7 ug/dL (ref 4.5–12.0)
TOTAL PROTEIN: 6.8 g/dL (ref 6.0–8.5)
TSH: 0.719 u[IU]/mL (ref 0.450–4.500)
Triglycerides: 36 mg/dL (ref 0–149)
URIC ACID: 4 mg/dL (ref 2.5–7.1)
VLDL CHOLESTEROL CAL: 7 mg/dL (ref 5–40)
WBC: 3.5 10*3/uL (ref 3.4–10.8)

## 2017-09-30 NOTE — Progress Notes (Signed)
Dear Elizabeth Williamson, I wanted to let you know that your blood work came back and everything is normal.  Your absolute neutrophils count is slightly decreased, which is commonly found during wellness exams and is often normal when repeated. I'd like you to follow-up with your primary care provider regarding this to see if they would like to repeat this. Let me know if you have any questions.

## 2018-10-27 ENCOUNTER — Encounter: Payer: Self-pay | Admitting: Emergency Medicine

## 2018-10-27 ENCOUNTER — Other Ambulatory Visit: Payer: Self-pay

## 2018-10-27 ENCOUNTER — Ambulatory Visit
Admission: EM | Admit: 2018-10-27 | Discharge: 2018-10-27 | Disposition: A | Discharge status: Home / Self Care | Payer: Managed Care, Other (non HMO) | Attending: Physician Assistant | Admitting: Physician Assistant

## 2018-10-27 ENCOUNTER — Telehealth: Payer: Self-pay

## 2018-10-27 DIAGNOSIS — J45909 Unspecified asthma, uncomplicated: Secondary | ICD-10-CM

## 2018-10-27 MED ORDER — ALBUTEROL SULFATE HFA 108 (90 BASE) MCG/ACT IN AERS: 1.0000 | INHALATION_SPRAY | RESPIRATORY_TRACT | 1 refills | Status: DC | PRN

## 2018-10-27 NOTE — Telephone Encounter (Signed)
Patient called and stated that she had been exposed to cleaning chemicals on Saturday and feels like she irritated her lungs from breathing in the chemicals. She was advised to go to the ED for evaluation as we are unable to treat her for respiratory issues if needed. She gave verbal understanding.

## 2018-10-27 NOTE — ED Provider Notes (Signed)
[name]    CSN: [id number] Arrival date & time: 10/27/18  1420      History   Chief Complaint Chief Complaint  Patient presents with  . Asthma    HPI Elizabeth Williamson is a 28 y.o. female.   The history is provided by the patient. No language interpreter was used.  Asthma This is a recurrent problem. The current episode started yesterday. The problem occurs constantly. Nothing aggravates the symptoms. Nothing relieves the symptoms. She has tried nothing for the symptoms. The treatment provided no relief.  Pt reports she has not been diagnosed with asthma but has a history of wheezing when exposed to some irritants. Pt is out of albuterol   Past Medical History:  Diagnosis Date  . Alopecia    got injections through dermatologist  . Migraine    rare    Patient Active Problem List   Diagnosis Date Noted  . Term pregnancy 12/15/2016  . SVD (spontaneous vaginal delivery) 12/15/2016  . Postpartum care following vaginal delivery 12/15/2016  . NSVD (normal spontaneous vaginal delivery) 10/14/2015  . Active labor 10/11/2015  . Group B Streptococcus carrier, antepartum 09/17/2015  . Asthma, mild intermittent 05/06/2015  . Allergic rhinitis 05/06/2015  . Supervision of normal first pregnancy 05/06/2015  . Panic attack 12/30/2013  . Gastroesophageal reflux disease without esophagitis 11/09/2013    Past Surgical History:  Procedure Laterality Date  . NO PAST SURGERIES      OB History    Gravida  3   Para  2   Term  2   Preterm      AB  1   Living  2     SAB  1   TAB  0   Ectopic      Multiple  0   Live Births  2            Home Medications    Prior to Admission medications   Medication Sig Start Date End Date Taking? Authorizing Provider  albuterol (PROAIR HFA) 108 (90 Base) MCG/ACT inhaler Inhale 1-2 puffs into the lungs every 4 (four) hours as needed. 10/27/18   Elson AreasSofia, Patirica Longshore K, PA-C  docusate sodium (COLACE) 100 MG capsule  Take 100 mg by mouth daily as needed for mild constipation.    [provider]  fluticasone (FLONASE) 50 MCG/ACT nasal spray Place 2 sprays into both nostrils daily. 05/22/17   Collene Gobbleaub, Steven A, MD  ibuprofen (ADVIL,MOTRIN) 600 MG tablet Take 1 tablet (600 mg total) by mouth every 6 (six) hours. Patient not taking: Reported on 05/22/2017 12/17/16   Huel Coteichardson, Kathy, MD  Prenatal Vit-Fe Fumarate-FA (PRENATAL MULTIVITAMIN) TABS tablet Take 1 tablet by mouth daily at 12 noon.    [provider]    Family History Family History  Problem Relation Age of Onset  . Heart disease Brother        recently found to have enlarged heart (age 30)  . Diabetes Maternal Grandmother   . Cancer Maternal Grandfather        colon cancer (60's)  . Hypertension Paternal Grandmother   . Cancer Paternal Uncle        lung cancer (nonsmoker)    Social History Social History   Tobacco Use  . Smoking status: Former Smoker    Packs/day: 0.50    Years: 3.00    Pack years: 1.50    Types: Cigarettes    Quit date: 08/14/2013    Years since quitting: 5.2  .  Smokeless tobacco: Never Used  . Tobacco comment: increased to 3 pack/week  Substance Use Topics  . Alcohol use: Yes    Alcohol/week: 0.0 standard drinks    Comment: Occasional social drinker, every other weekend  . Drug use: No     Allergies   Patient has no known allergies.   Review of Systems Review of Systems  All other systems reviewed and are negative.    Physical Exam Triage Vital Signs ED Triage Vitals  Enc Vitals Group     BP 10/27/18 1455 123/82     Pulse Rate 10/27/18 1455 75     Resp 10/27/18 1455 16     Temp 10/27/18 1455 99.2 F (37.3 C)     Temp Source 10/27/18 1455 Oral     SpO2 10/27/18 1455 98 %     Weight --      Height --      Head Circumference --      Peak Flow --      Pain Score 10/27/18 1453 0     Pain Loc --      Pain Edu? --      Excl. in GC? --    No data found.  Updated Vital Signs BP  123/82 (BP Location: Left Arm)   Pulse 75   Temp 99.2 F (37.3 C) (Oral)   Resp 16   SpO2 98%   Visual Acuity Right Eye Distance:   Left Eye Distance:   Bilateral Distance:    Right Eye Near:   Left Eye Near:    Bilateral Near:     Physical Exam Vitals signs and nursing note reviewed.  Constitutional:      Appearance: She is well-developed.  HENT:     Head: Normocephalic.  Neck:     Musculoskeletal: Normal range of motion.  Cardiovascular:     Rate and Rhythm: Normal rate.  Pulmonary:     Effort: Pulmonary effort is normal.     Breath sounds: Normal breath sounds. No wheezing.  Abdominal:     General: There is no distension.  Musculoskeletal: Normal range of motion.  Neurological:     Mental Status: She is alert and oriented to person, place, and time.      UC Treatments / Results  Labs (all labs ordered are listed, but only abnormal results are displayed) Labs Reviewed - No data to display  EKG   Radiology No results found.  Procedures Procedures (including critical care time)  Medications Ordered in UC Medications - No data to display  Initial Impression / Assessment and Plan / UC Course  I have reviewed the triage vital signs and the nursing notes.  Pertinent labs & imaging results that were available during my care of the patient were reviewed by me and considered in my medical decision making (see chart for details).     MDM patient given prescription for albuterol inhaler she is advised to avoid chemical triggers. Final Clinical Impressions(s) / UC Diagnoses   Final diagnoses:  Mild asthma without complication, unspecified whether persistent     Discharge Instructions     Return if any problems.    ED Prescriptions    Medication Sig Dispense Auth. Provider   albuterol (PROAIR HFA) 108 (90 Base) MCG/ACT inhaler Inhale 1-2 puffs into the lungs every 4 (four) hours as needed. 18 g Elson AreasSofia, Salik Grewell K, [place]PA-C     Controlled Substance  Prescriptions [place] Controlled Substance Registry consulted? Not Applicable   Elson AreasSofia, Fawzi Melman K,  PA-C 10/27/18 1608

## 2018-10-27 NOTE — ED Triage Notes (Signed)
Pt presents to [facility] for assessment of asthma exacerbation after using cleaning products all weekend to clean.  Patient has a hx of asthma, and uses an albuterol inhaler, which she is currently out of.  Patient c/o some muscle aching in her back.

## 2018-10-27 NOTE — Discharge Instructions (Signed)
Return if any problems.

## 2018-10-27 NOTE — ED Notes (Signed)
Patient able to ambulate independently  

## 2018-11-01 ENCOUNTER — Other Ambulatory Visit: Payer: Self-pay

## 2018-11-01 ENCOUNTER — Ambulatory Visit
Admission: EM | Admit: 2018-11-01 | Discharge: 2018-11-01 | Disposition: A | Discharge status: Home / Self Care | Payer: Managed Care, Other (non HMO)

## 2018-11-01 DIAGNOSIS — R0789 Other chest pain: Secondary | ICD-10-CM

## 2018-11-01 DIAGNOSIS — J3089 Other allergic rhinitis: Secondary | ICD-10-CM

## 2018-11-01 MED ORDER — PREDNISONE 10 MG (21) PO TBPK: ORAL_TABLET | ORAL | 0 refills | Status: DC

## 2018-11-01 MED ORDER — IPRATROPIUM-ALBUTEROL 0.5-2.5 (3) MG/3ML IN SOLN
3.0000 mL | Freq: Once | RESPIRATORY_TRACT | Status: AC
  Administered 2018-11-01: 3 mL via RESPIRATORY_TRACT

## 2018-11-01 NOTE — Discharge Instructions (Signed)
It was very nice seeing you today in clinic. Thank you for entrusting me with your care.  ° °Please utilize the medications that we discussed. Your prescriptions have been called in to your pharmacy.  ° °Make arrangements to follow up with your regular doctor in 1 week for re-evaluation if not improving. If your symptoms/condition worsens, please seek follow up care either here or in the ER. Please remember, our [place] providers are "right here with you" when you need us.  ° °Again, it was my pleasure to take care of you today. Thank you for choosing our clinic. I hope that you start to feel better quickly.  ° °Nikala Walsworth, MSN, APRN, FNP-C, CEN °Advanced Practice Provider °[facility] ° °

## 2018-11-01 NOTE — ED Provider Notes (Signed)
[place]   Name: Elizabeth HammedMelanie C Hallum DOB: April 21, 1990 MRN: [id number] CSN: [id number] PCP: Joselyn ArrowKnapp, Eve, MD  Arrival date and time:  11/01/18 1026  Chief Complaint:  Muscle Pain   NOTE: Prior to seeing the patient today, I have reviewed the triage nursing documentation and vital signs. Clinical staff has updated patient's PMH/PSHx, current medication list, and drug allergies/intolerances to ensure comprehensive history available to assist in medical decision making.   History:   HPI: Elizabeth Williamson is a 28 y.o. female who presents today with complaints of pain in her upper back, slight shortness of breath, and chest tightness x 3 week. Patient was [facility] at [facility] on 10/27/2018 for the same. Patient advised provider that "smells" triggered her symptoms and that she had been around her mother who was using a lot of cleaning products. She was prescribed an albuterol MDI for possible asthma exacerbation. Patient advising that she felt "maybe a little better" after using the inhaler. She notes that she has only used it once since it was prescribed. Patient denies an actual diagnosis of asthma. She has only used an inhaler one other time in the past 4 years.   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 He has had recent SARS-CoV-2 (novel coronavirus) testing that was negative. Patient denies any associated cough, wheezing, sore throat, otalgia, and fevers. PMH (+) for seasonal allergies; takes daily cetirizine and fluticasone.    Past Medical History:  Diagnosis Date   Alopecia    got injections through dermatologist   Migraine    rare    Past Surgical History:  Procedure Laterality Date   NO PAST SURGERIES      Family History  Problem Relation Age of Onset   Heart disease Brother        recently found to have enlarged heart (age 30)   Diabetes Maternal Grandmother    Cancer Maternal Grandfather        colon cancer (60's)     Hypertension Paternal Grandmother    Cancer Paternal Uncle        lung cancer (nonsmoker)    Social History   Tobacco Use   Smoking status: Former Smoker    Packs/day: 0.50    Years: 3.00    Pack years: 1.50    Types: Cigarettes    Quit date: 08/14/2013    Years since quitting: 5.2   Smokeless tobacco: Never Used   Tobacco comment: increased to 3 pack/week  Substance Use Topics   Alcohol use: Yes    Alcohol/week: 0.0 standard drinks    Comment: Occasional social drinker, every other weekend   Drug use: No    Patient Active Problem List   Diagnosis Date Noted   Term pregnancy 12/15/2016   SVD (spontaneous vaginal delivery) 12/15/2016   Postpartum care following vaginal delivery 12/15/2016   NSVD (normal spontaneous vaginal delivery) 10/14/2015   Active labor 10/11/2015   Group B Streptococcus carrier, antepartum 09/17/2015   Asthma, mild intermittent 05/06/2015   Allergic rhinitis 05/06/2015   Supervision of normal first pregnancy 05/06/2015   Panic attack 12/30/2013   Gastroesophageal reflux disease without esophagitis 11/09/2013    Home Medications:    Current Meds  Medication Sig   etonogestrel (NEXPLANON) 68 MG IMPL implant 1 each by Subdermal route once.   Multiple Vitamin (MULTIVITAMIN WITH MINERALS) TABS tablet Take 1 tablet by mouth daily.    Allergies:   Patient has no known allergies.  Review of Systems (ROS): Review of Systems  Constitutional: Negative for chills and fever.  HENT: Negative for congestion, ear pain, postnasal drip, rhinorrhea, sinus pressure, sinus pain and sore throat.   Respiratory: Positive for chest tightness and shortness of breath. Negative for cough.   Cardiovascular: Negative for chest pain and palpitations.  Gastrointestinal: Negative for abdominal pain, diarrhea, nausea and vomiting.  Musculoskeletal: Positive for back pain. Negative for arthralgias, myalgias and neck pain.  Skin: Negative for color  change, pallor and rash.  Neurological: Negative for dizziness, seizures, syncope, weakness and headaches.  Hematological: Negative for adenopathy.     Vital Signs: Today's Vitals   11/01/18 1037 11/01/18 1038 11/01/18 1116  BP: 111/73    Pulse: 81    Resp: 16    Temp: 98.3 F (36.8 C)    TempSrc: Oral    SpO2: 99%    Weight:  158 lb (71.7 kg)   Height:  5\' 7"  (1.702 m)   PainSc:  4  4     Physical Exam: Physical Exam  Constitutional: She is oriented to person, place, and time and well-developed, well-nourished, and in no distress.  HENT:  Head: Normocephalic and atraumatic.  Mouth/Throat: Mucous membranes are normal.  Eyes: Pupils are equal, round, and reactive to light. EOM are normal.  Neck: Normal range of motion. Neck supple. No tracheal deviation present.  Cardiovascular: Normal rate, regular rhythm, normal heart sounds and intact distal pulses. Exam reveals no gallop and no friction rub.  No murmur heard. Pulmonary/Chest: Effort normal and breath sounds normal. No respiratory distress. She has no wheezes. She has no rales.  Musculoskeletal:     Cervical back: She exhibits pain (soreness). She exhibits normal range of motion, no tenderness and no spasm.  Lymphadenopathy:    She has no cervical adenopathy.  Neurological: She is alert and oriented to person, place, and time. Gait normal. GCS score is 15.  Skin: Skin is warm and dry. No rash noted.  Psychiatric: Mood, memory, affect and judgment normal.  Nursing note and vitals reviewed.   Urgent Care Treatments / Results:   LABS: PLEASE NOTE: all labs that were ordered this encounter are listed, however only abnormal results are displayed. Labs Reviewed - No data to display  EKG: -None  RADIOLOGY: No results found.  PROCEDURES: Procedures  MEDICATIONS RECEIVED THIS VISIT: Medications  ipratropium-albuterol (DUONEB) 0.5-2.5 (3) MG/3ML nebulizer solution 3 mL (3 mLs Nebulization Given 11/01/18 1103)     PERTINENT CLINICAL COURSE NOTES/UPDATES: Clinical Course as of Nov 01 1123  Sat Nov 01, 2018  1110 Patient improved following SVN. No longer feels tightness sensation in chest. Will proceed with discharge.    [BG]    Clinical Course User Index [BG] Verlee MonteGray, Cassidey Barrales E, NP   Initial Impression / Assessment and Plan / Urgent Care Course:  Pertinent labs & imaging results that were available during my care of the patient were personally reviewed by me and considered in my medical decision making (see lab/imaging section of note for values and interpretations).  Elizabeth Williamson is a 28 y.o. female who [facility] today with complaints of upper back pain and chest tightness.   Patient is well appearing overall in clinic today. She does not appear to be in any acute distress. Presenting symptoms (see HPI) and exam as documented above. Symptoms improved following Duoneb SVN x 1. Recent negative SARS-CoV-2 (novel coronavirus) testing. PMH (+) for seasonal allergies. She had been outside more and  also exposed to chemical fumes at her mother's prior to symptoms. Ongoing chest tightness will be treated with a 6 day prednisone taper. Patient encouraged to continue PRN use of prescribed MDI. Encouraged to avoid triggers and continue daily cetirizine and fluticasone. Steroids should help with muscle "soreness" as well, however may use APAP/IBU on a PRN basis as well.   Discussed follow up with primary care physician in 1 week for re-evaluation. I have reviewed the follow up and strict return precautions for any new or worsening symptoms. Patient is aware of symptoms that would be deemed urgent/emergent, and would thus require further evaluation either here or in the emergency department. At the time of discharge, she verbalized understanding and consent with the discharge plan as it was reviewed with her. All questions were fielded by provider and/or clinic staff prior to patient discharge.     Final Clinical Impressions / Urgent Care Diagnoses:   Final diagnoses:  Environmental and seasonal allergies  Chest tightness    New Prescriptions:  [place] Controlled Substance Registry consulted? Not Applicable  Meds ordered this encounter  Medications   ipratropium-albuterol (DUONEB) 0.5-2.5 (3) MG/3ML nebulizer solution 3 mL   predniSONE (STERAPRED UNI-PAK 21 TAB) 10 MG (21) TBPK tablet    Sig: 60 mg x 1 day, 50 mg x 1 day, 40 mg x 1 day, 30 mg x 1 day, 20 mg x 1 day, 10 mg x 1 day    Dispense:  21 tablet    Refill:  0    Recommended Follow up Care:  Patient encouraged to follow up with the following provider within the specified time frame, or sooner as dictated by the severity of her symptoms. As always, she was instructed that for any urgent/emergent care needs, she should seek care either here or in the emergency department for more immediate evaluation.  Follow-up Information    Rita Ohara, MD In 1 week.   Specialty: Family Medicine Why: General reassessment of symptoms if not improving Contact information: [address] [place] [address] [phone]         NOTE: This note was prepared using Dragon dictation software along with smaller phrase technology. Despite my best ability to proofread, there is the potential that transcriptional errors may still occur from this process, and are completely unintentional. ;   Karen Kitchens, NP 11/01/18 1220

## 2018-11-01 NOTE — ED Triage Notes (Signed)
Pt states a few weeks ago she started having pain across her upper back and then some slight SOB and slight chest tightness. Had COVID testing which was negative. Went to Urgent Care last week in [place] and was prescribed inhaler. Still having sx.

## 2018-11-27 ENCOUNTER — Other Ambulatory Visit: Payer: Self-pay

## 2018-11-27 DIAGNOSIS — Z20822 Contact with and (suspected) exposure to covid-19: Secondary | ICD-10-CM

## 2018-11-28 LAB — NOVEL CORONAVIRUS, NAA: SARS-CoV-2, NAA: NOT DETECTED

## 2018-12-20 ENCOUNTER — Encounter: Payer: Self-pay | Admitting: Physician Assistant

## 2018-12-20 ENCOUNTER — Other Ambulatory Visit: Payer: Self-pay

## 2018-12-20 ENCOUNTER — Ambulatory Visit
Admission: EM | Admit: 2018-12-20 | Discharge: 2018-12-20 | Disposition: A | Discharge status: Home / Self Care | Payer: Managed Care, Other (non HMO)

## 2018-12-20 DIAGNOSIS — K5900 Constipation, unspecified: Secondary | ICD-10-CM

## 2018-12-20 NOTE — ED Triage Notes (Signed)
Per pt she has been having constipation for about 2 weeks and did take something with a small relief more watery. Is able to move gas but her stomach is distended. Pt said some  discomfort and feels like she is full.No pain but some cramps.

## 2018-12-20 NOTE — Discharge Instructions (Signed)
No alarming signs on exam. Continue miralax daily for the next 2 weeks. You can add mineral oil enema to help with bowel movement. Keep hydrated, urine should be clear to pale yellow in color. Follow up with PCP as scheduled in 2 weeks for further evaluation if symptoms not improving. If significant abdominal pain, nausea/vomiting, not passing gas, go to the ED for further evaluation needed.

## 2018-12-20 NOTE — ED Provider Notes (Signed)
[name]    CSN: [id number] Arrival date & time: 12/20/18  0802      History   Chief Complaint Chief Complaint  Patient presents with  . Constipation    HPI Elizabeth Williamson is a 28 y.o. female.   28 year old female comes in for 2-week history of constipation.  States first tried an over-the-counter laxative with minimal bowel movement.  She then tried mag citrate with small watery stools.  After mag citrate, she has had increase in abdominal cramping.  Denies nausea or vomiting.  Denies URI symptoms such as cough, congestion, sore throat.  Denies fever, chills, body aches.  She recently changed her diet to cut down on dairy and gluten, and increase in protein and vegetables.  She has increased water intake, and added on a probiotic.  Prior to diet change, she usually moves her bowels every 4 days without any straining.  Denies history of abdominal surgeries.     Past Medical History:  Diagnosis Date  . Alopecia    got injections through dermatologist  . Migraine    rare    Patient Active Problem List   Diagnosis Date Noted  . Term pregnancy 12/15/2016  . SVD (spontaneous vaginal delivery) 12/15/2016  . Postpartum care following vaginal delivery 12/15/2016  . NSVD (normal spontaneous vaginal delivery) 10/14/2015  . Active labor 10/11/2015  . Group B Streptococcus carrier, antepartum 09/17/2015  . Asthma, mild intermittent 05/06/2015  . Allergic rhinitis 05/06/2015  . Supervision of normal first pregnancy 05/06/2015  . Panic attack 12/30/2013  . Gastroesophageal reflux disease without esophagitis 11/09/2013    Past Surgical History:  Procedure Laterality Date  . NO PAST SURGERIES      OB History    Gravida  3   Para  2   Term  2   Preterm      AB  1   Living  2     SAB  1   TAB  0   Ectopic      Multiple  0   Live Births  2            Home Medications    Prior to Admission medications   Medication Sig Start Date End  Date Taking? Authorizing Provider  albuterol (PROAIR HFA) 108 (90 Base) MCG/ACT inhaler Inhale 1-2 puffs into the lungs every 4 (four) hours as needed. 10/27/18   Fransico Meadow, PA-C  etonogestrel (NEXPLANON) 68 MG IMPL implant 1 each by Subdermal route once.    [provider]  fluticasone (FLONASE) 50 MCG/ACT nasal spray Place 2 sprays into both nostrils daily. 05/22/17   Darlyne Russian, MD  Multiple Vitamin (MULTIVITAMIN WITH MINERALS) TABS tablet Take 1 tablet by mouth daily.    [provider]  predniSONE (STERAPRED UNI-PAK 21 TAB) 10 MG (21) TBPK tablet 60 mg x 1 day, 50 mg x 1 day, 40 mg x 1 day, 30 mg x 1 day, 20 mg x 1 day, 10 mg x 1 day 11/01/18   Karen Kitchens, NP    Family History Family History  Problem Relation Age of Onset  . Heart disease Brother        recently found to have enlarged heart (age 62)  . Diabetes Maternal Grandmother   . Cancer Maternal Grandfather        colon cancer (60's)  . Hypertension Paternal Grandmother   . Cancer Paternal Uncle        lung cancer (nonsmoker)  Social History Social History   Tobacco Use  . Smoking status: Former Smoker    Packs/day: 0.50    Years: 3.00    Pack years: 1.50    Types: Cigarettes    Quit date: 08/14/2013    Years since quitting: 5.3  . Smokeless tobacco: Never Used  . Tobacco comment: increased to 3 pack/week  Substance Use Topics  . Alcohol use: Yes    Alcohol/week: 0.0 standard drinks    Comment: Occasional social drinker, every other weekend  . Drug use: No     Allergies   Patient has no known allergies.   Review of Systems Review of Systems  Reason unable to perform ROS: See HPI as above.     Physical Exam Triage Vital Signs ED Triage Vitals  Enc Vitals Group     BP 12/20/18 0812 112/72     Pulse Rate 12/20/18 0812 81     Resp 12/20/18 0812 16     Temp 12/20/18 0812 98.6 F (37 C)     Temp Source 12/20/18 0812 Oral     SpO2 12/20/18 0812 98 %     Weight --       Height --      Head Circumference --      Peak Flow --      Pain Score 12/20/18 0817 0     Pain Loc --      Pain Edu? --      Excl. in GC? --    No data found.  Updated Vital Signs BP 112/72 (BP Location: Right Arm)   Pulse 81   Temp 98.6 F (37 C) (Oral)   Resp 16   SpO2 98%   Physical Exam Constitutional:      General: She is not in acute distress.    Appearance: She is well-developed. She is not ill-appearing, toxic-appearing or diaphoretic.  HENT:     Head: Normocephalic and atraumatic.  Eyes:     Conjunctiva/sclera: Conjunctivae normal.     Pupils: Pupils are equal, round, and reactive to light.  Cardiovascular:     Rate and Rhythm: Normal rate and regular rhythm.     Heart sounds: Normal heart sounds. No murmur. No friction rub. No gallop.   Pulmonary:     Effort: Pulmonary effort is normal.     Breath sounds: Normal breath sounds. No wheezing or rales.  Abdominal:     General: Bowel sounds are normal.     Palpations: Abdomen is soft.     Tenderness: There is no abdominal tenderness. There is no right CVA tenderness, left CVA tenderness, guarding or rebound.  Skin:    General: Skin is warm and dry.  Neurological:     Mental Status: She is alert and oriented to person, place, and time.  Psychiatric:        Behavior: Behavior normal.        Judgment: Judgment normal.      UC Treatments / Results  Labs (all labs ordered are listed, but only abnormal results are displayed) Labs Reviewed - No data to display  EKG   Radiology No results found.  Procedures Procedures (including critical care time)  Medications Ordered in UC Medications - No data to display  Initial Impression / Assessment and Plan / UC Course  I have reviewed the triage vital signs and the nursing notes.  Pertinent labs & imaging results that were available during my care of the patient were reviewed by me and  considered in my medical decision making (see chart for details).     Will start patient on miralax, mineral oil enema. Push fluids. Follow up with PCP if symptoms not improving.   Final Clinical Impressions(s) / UC Diagnoses   Final diagnoses:  Constipation, unspecified constipation type   ED Prescriptions    None        Belinda FisherYu, Amy V, PA-C 12/20/18 1053

## 2018-12-31 ENCOUNTER — Other Ambulatory Visit: Payer: Self-pay

## 2018-12-31 ENCOUNTER — Encounter: Payer: Self-pay | Admitting: Family Medicine

## 2018-12-31 ENCOUNTER — Ambulatory Visit: Payer: Managed Care, Other (non HMO) | Admitting: Family Medicine

## 2018-12-31 VITALS — BP 117/68 | HR 83 | Temp 98.9°F | Resp 17 | Ht 67.0 in | Wt 157.4 lb

## 2018-12-31 DIAGNOSIS — Z0000 Encounter for general adult medical examination without abnormal findings: Secondary | ICD-10-CM

## 2018-12-31 DIAGNOSIS — Z131 Encounter for screening for diabetes mellitus: Secondary | ICD-10-CM

## 2018-12-31 DIAGNOSIS — L639 Alopecia areata, unspecified: Secondary | ICD-10-CM

## 2018-12-31 DIAGNOSIS — Z0001 Encounter for general adult medical examination with abnormal findings: Secondary | ICD-10-CM

## 2018-12-31 DIAGNOSIS — L659 Nonscarring hair loss, unspecified: Secondary | ICD-10-CM

## 2018-12-31 DIAGNOSIS — R6889 Other general symptoms and signs: Secondary | ICD-10-CM

## 2018-12-31 DIAGNOSIS — Z13220 Encounter for screening for lipoid disorders: Secondary | ICD-10-CM

## 2018-12-31 NOTE — Patient Instructions (Signed)
° ° ° °  If you have lab work done today you will be contacted with your lab results within the next 2 weeks.  If you have not heard from us then please contact us. The fastest way to get your results is to register for My Chart. ° ° °IF you received an x-ray today, you will receive an invoice from [facility]. Please contact [facility] at [phone] with questions or concerns regarding your invoice.  ° °IF you received labwork today, you will receive an invoice from [facility]. Please contact [facility] at [phone] with questions or concerns regarding your invoice.  ° °Our billing staff will not be able to assist you with questions regarding bills from these companies. ° °You will be contacted with the lab results as soon as they are available. The fastest way to get your results is to activate your My Chart account. Instructions are located on the last page of this paperwork. If you have not heard from us regarding the results in 2 weeks, please contact this office. °  ° ° ° °
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
## 2019-01-01 LAB — CBC
Hematocrit: 36.8 % (ref 34.0–46.6)
Hemoglobin: 11.6 g/dL (ref 11.1–15.9)
MCH: 28.4 pg (ref 26.6–33.0)
MCHC: 31.5 g/dL (ref 31.5–35.7)
MCV: 90 fL (ref 79–97)
Platelets: 214 10*3/uL (ref 150–450)
RBC: 4.08 x10E6/uL (ref 3.77–5.28)
RDW: 12.1 % (ref 11.7–15.4)
WBC: 4.7 10*3/uL (ref 3.4–10.8)

## 2019-01-01 LAB — LIPID PANEL
Chol/HDL Ratio: 1.8 ratio (ref 0.0–4.4)
Cholesterol, Total: 186 mg/dL (ref 100–199)
HDL: 101 mg/dL (ref >39)
LDL Chol Calc (NIH): 75 mg/dL (ref 0–99)
Triglycerides: 48 mg/dL (ref 0–149)
VLDL Cholesterol Cal: 10 mg/dL (ref 5–40)

## 2019-01-01 LAB — BASIC METABOLIC PANEL
BUN/Creatinine Ratio: 14 (ref 9–23)
BUN: 10 mg/dL (ref 6–20)
CO2: 22 mmol/L (ref 20–29)
Calcium: 9.6 mg/dL (ref 8.7–10.2)
Chloride: 106 mmol/L (ref 96–106)
Creatinine, Ser: 0.72 mg/dL (ref 0.57–1.00)
GFR calc Af Amer: 130 mL/min/{1.73_m2} (ref >59)
GFR calc non Af Amer: 113 mL/min/{1.73_m2} (ref >59)
Glucose: 101 mg/dL — ABNORMAL HIGH (ref 65–99)
Potassium: 4 mmol/L (ref 3.5–5.2)
Sodium: 142 mmol/L (ref 134–144)

## 2019-01-01 LAB — TSH: TSH: 0.86 u[IU]/mL (ref 0.450–4.500)

## 2019-01-01 LAB — HEMOGLOBIN A1C
Est. average glucose Bld gHb Est-mCnc: 103 mg/dL
Hgb A1c MFr Bld: 5.2 % (ref 4.8–5.6)

## 2019-01-01 LAB — VITAMIN D 25 HYDROXY (VIT D DEFICIENCY, FRACTURES): Vit D, 25-Hydroxy: 30.1 ng/mL (ref 30.0–100.0)

## 2019-01-02 ENCOUNTER — Encounter: Payer: Self-pay | Admitting: Family Medicine

## 2019-02-03 ENCOUNTER — Telehealth: Payer: Self-pay | Admitting: Family Medicine

## 2019-02-03 NOTE — Telephone Encounter (Signed)
Faxed respnse from [facility] dermatology referral 10.20.2020  Regarding this pt to [place] queue FR

## 2019-03-31 ENCOUNTER — Ambulatory Visit
Admission: EM | Admit: 2019-03-31 | Discharge: 2019-03-31 | Disposition: A | Discharge status: Home / Self Care | Payer: Managed Care, Other (non HMO) | Attending: Family Medicine | Admitting: Family Medicine

## 2019-03-31 ENCOUNTER — Other Ambulatory Visit: Payer: Self-pay

## 2019-03-31 DIAGNOSIS — Z7189 Other specified counseling: Secondary | ICD-10-CM

## 2019-03-31 DIAGNOSIS — J069 Acute upper respiratory infection, unspecified: Secondary | ICD-10-CM

## 2019-03-31 DIAGNOSIS — Z20822 Contact with and (suspected) exposure to covid-19: Secondary | ICD-10-CM

## 2019-03-31 DIAGNOSIS — Z9189 Other specified personal risk factors, not elsewhere classified: Secondary | ICD-10-CM

## 2019-03-31 NOTE — Discharge Instructions (Addendum)
It was very nice seeing you today in clinic. Thank you for entrusting me with your care.   Rest and make sure that you are staying well hydrated. Continue the Zyrtec and Sudafed. May find benefit in adding the Flonase. May use Tylenol and/or Ibuprofen as needed for pain/fever.   You were tested for SARS-CoV-2 (novel coronavirus) today. Testing is performed by an outside lab ([facility]) and has variable turn around times ranging between 2-5 days. Current recommendations from the the CDC and [place] DHHS require that you remain out of work in order to quarantine at home until negative test results are have been received. In the event that your test results are positive, you will be contacted with further directives. These measures are being implemented out of an abundance of caution to prevent transmission and spread during the current SARS-CoV-2 pandemic.  Make arrangements to follow up with your regular doctor in 1 week for re-evaluation if not improving. If your symptoms/condition worsens, please seek follow up care either here or in the ER. Please remember, our [place] providers are "right here with you" when you need [place].   Again, it was my pleasure to take care of you today. Thank you for choosing our clinic. I hope that you start to feel better quickly.   Honor Loh, MSN, APRN, FNP-C, CEN Advanced Practice Provider [facility]

## 2019-03-31 NOTE — ED Triage Notes (Signed)
Patient complains of body aches, fatigue, sinus pressure x yesterday.

## 2019-04-01 NOTE — ED Provider Notes (Signed)
[place], [place]   Name: Elizabeth Williamson DOB: 1991/01/08 MRN: [id number] CSN: [id number] PCP: Doristine Bosworth, MD  Arrival date and time:  03/31/19 1743  Chief Complaint:  Generalized Body Aches, Fatigue, Sinus Problem, and COVID exposure   NOTE: Prior to seeing the patient today, I have reviewed the triage nursing documentation and vital signs. Clinical staff has updated patient's PMH/PSHx, current medication list, and drug allergies/intolerances to ensure comprehensive history available to assist in medical decision making.   History:   HPI: Elizabeth Williamson is a 28 y.o. female who presents today with complaints of fatigue, generalized myalgias, and sinus pressure that started with acute onset yesterday. She denies any cough, shortness of breath, or wheezing. PMH (+) for seasonal allergic rhinitis; takes cetirizine daily. Patient denies that she has experienced any nausea, vomiting, diarrhea, or abdominal pain. She is eating and drinking well. Patient denies any perceived alterations to her sense of taste or smell. Patient presents out of concerns for her on personal health in the setting of the current SARS-CoV-2 (novel coronavirus) pandemic. Patient is a Engineer, civil (consulting) at the [employer]. She reports that she is often assigned to test patient's who present with symptoms concerning for SARS-CoV-2 . Patient has a history of asthma, placing her at increased of complications should she contract the SARS-CoV-2 virus. Following the development of her symptoms yesterday, coupled with the fact that no one else is her home has experienced a similar symptom constellation, she decided to come in to be tested for the virus. She has not been tested for SARS-CoV-2 (novel coronavirus) in the past 14 days; last tested negative about 2 months ago per her report. Patient has been vaccinated for influenza this season. In efforts to conservatively manage her symptoms at home, the patient notes that she  has used pseudoephedrine, which has helped to improve her symptoms some.     Past Medical History:  Diagnosis Date  . Alopecia    got injections through dermatologist  . Migraine    rare    Past Surgical History:  Procedure Laterality Date  . NO PAST SURGERIES      Family History  Problem Relation Age of Onset  . Heart disease Brother        recently found to have enlarged heart (age 9)  . Diabetes Maternal Grandmother   . Cancer Maternal Grandfather        colon cancer (60's)  . Hypertension Paternal Grandmother   . Cancer Paternal Uncle        lung cancer (nonsmoker)    Social History   Tobacco Use  . Smoking status: Former Smoker    Packs/day: 0.50    Years: 3.00    Pack years: 1.50    Types: Cigarettes    Quit date: 08/14/2013    Years since quitting: 5.6  . Smokeless tobacco: Never Used  . Tobacco comment: increased to 3 pack/week  Substance Use Topics  . Alcohol use: Yes    Alcohol/week: 0.0 standard drinks    Comment: Occasional social drinker, every other weekend  . Drug use: No    Patient Active Problem List   Diagnosis Date Noted  . Term pregnancy 12/15/2016  . SVD (spontaneous vaginal delivery) 12/15/2016  . Postpartum care following vaginal delivery 12/15/2016  . NSVD (normal spontaneous vaginal delivery) 10/14/2015  . Active labor 10/11/2015  . Group B Streptococcus carrier, antepartum 09/17/2015  . Asthma, mild intermittent 05/06/2015  . Allergic rhinitis 05/06/2015  .  Supervision of normal first pregnancy 05/06/2015  . Panic attack 12/30/2013  . Gastroesophageal reflux disease without esophagitis 11/09/2013    Home Medications:    Current Meds  Medication Sig  . albuterol (PROAIR HFA) 108 (90 Base) MCG/ACT inhaler Inhale 1-2 puffs into the lungs every 4 (four) hours as needed.  . etonogestrel (NEXPLANON) 68 MG IMPL implant 1 each by Subdermal route once.  . Multiple Vitamin (MULTIVITAMIN WITH MINERALS) TABS tablet Take 1 tablet by mouth  daily.  Marland Kitchen PREBIOTIC PRODUCT PO Take by mouth.  . Probiotic Product (PROBIOTIC-10 ULTIMATE PO) Take by mouth.    Allergies:   Patient has no known allergies.  Review of Systems (ROS): Review of Systems  Constitutional: Positive for fatigue. Negative for fever.  HENT: Positive for congestion and sinus pressure. Negative for ear pain, postnasal drip, rhinorrhea, sinus pain, sneezing and sore throat.   Eyes: Negative for pain, discharge and redness.  Respiratory: Negative for cough, chest tightness and shortness of breath.   Cardiovascular: Negative for chest pain and palpitations.  Gastrointestinal: Negative for abdominal pain, diarrhea, nausea and vomiting.  Musculoskeletal: Positive for myalgias. Negative for arthralgias, back pain and neck pain.  Skin: Negative for color change, pallor and rash.  Allergic/Immunologic: Positive for environmental allergies (seasonal).  Neurological: Negative for dizziness, syncope, weakness and headaches.  Hematological: Negative for adenopathy.     Vital Signs: Today's Vitals   03/31/19 1813 03/31/19 1815 03/31/19 1842  BP:  122/80   Pulse:  80   Resp:  17   Temp:  98.8 F (37.1 C)   TempSrc:  Oral   SpO2:  98%   Weight: 155 lb (70.3 kg)    Height:  (1.676 m)    PainSc: 3   3     Physical Exam: Physical Exam  Constitutional: She is oriented to person, place, and time and well-developed, well-nourished, and in no distress.  HENT:  Head: Normocephalic and atraumatic.  Right Ear: Hearing normal. Tympanic membrane is not perforated and not bulging. Right ear middle ear effusion: mild; serous.  Left Ear: Hearing normal. Tympanic membrane is not perforated and not bulging. Left ear middle ear effusion: mild; serous.  Nose: Mucosal edema and sinus tenderness (pressure; maxillary and frontal sinuses) present. No rhinorrhea.  Mouth/Throat: Uvula is midline, oropharynx is clear and moist and mucous membranes are normal.  Eyes: Pupils are  equal, round, and reactive to light.  Cardiovascular: Normal rate, regular rhythm, normal heart sounds and intact distal pulses.  Pulmonary/Chest: Effort normal and breath sounds normal.  No cough or increased WOB. SPO2 98% on RA.   Neurological: She is alert and oriented to person, place, and time. Gait normal.  Skin: Skin is warm and dry. No rash noted. She is not diaphoretic.  Psychiatric: Mood, memory, affect and judgment normal.  Nursing note and vitals reviewed.   Urgent Care Treatments / Results:  LABS: PLEASE NOTE: all labs that were ordered this encounter are listed, however only abnormal results are displayed. Labs Reviewed  NOVEL CORONAVIRUS, NAA (HOSP ORDER, SEND-OUT TO REF LAB; TAT 18-24 HRS)    EKG: -None  RADIOLOGY: No results found.  PROCEDURES: Procedures  MEDICATIONS RECEIVED THIS VISIT: Medications - No data to display  PERTINENT CLINICAL COURSE NOTES/UPDATES:   Initial Impression / Assessment and Plan / Urgent Care Course:  Pertinent labs & imaging results that were available during my care of the patient were personally reviewed by me and considered in my medical decision making (see  lab/imaging section of note for values and interpretations).  Elizabeth Williamson is a 28 y.o. female who presents to [facility] today with complaints of Generalized Body Aches, Fatigue, Sinus Problem, and COVID exposure   Patient overall well appearing and in no acute distress today in clinic. Presenting symptoms (see HPI) and exam as documented above. She presents with symptoms associated with SARS-CoV-2 (novel coronavirus). Discussed typical symptom constellation. Reviewed potential for infection and need for testing given her increased risk of occupational exposure to the SARS-CoV-2 virus. Patient amenable to being tested. SARS-CoV-2 swab collected by certified clinical staff. Discussed variable turn around times associated with testing, as swabs are being processed at  [facility], and have been taking between 2-5 days to come back. She was advised to self quarantine, per [facility] DHHS guidelines, until negative results received. These measures are being implemented out of an abundance of caution to prevent transmission and spread during the current SARS-CoV-2 pandemic.  Presenting symptoms consistent with acute viral illness. Until ruled out with confirmatory lab testing, SARS-CoV-2 remains part of the differential. Her testing is pending at this time. I discussed with her that her symptoms are felt to be viral in nature, thus antibiotics would not offer her any relief or improve his symptoms any faster than conservative symptomatic management. Patient to continue cetirizine and pseudoephedrine per package instructions. Recommended adding fluticasone nasal spray to help with congestion. Discussed supportive care measures at home during acute phase of illness. Patient to rest as much as possible. She was encouraged to ensure adequate hydration (water and ORS) to prevent dehydration and electrolyte derangements. Patient may use APAP and/or IBU on an as needed basis for pain/fever.   Current clinical condition warrants patient being out of work in order to quarantine while waiting for testing results. She was provided with the appropriate documentation to provide to her place of employment that will allow for her to RTW on 04/13/2019 with no restrictions. RTW is contingent on her SARS-CoV-2 test results being reviewed as negative.   Discussed follow up with primary care physician in 1 week for re-evaluation. I have reviewed the follow up and strict return precautions for any new or worsening symptoms. Patient is aware of symptoms that would be deemed urgent/emergent, and would thus require further evaluation either here or in the emergency department. At the time of discharge, she verbalized understanding and consent with the discharge plan as it was reviewed with her. All questions  were fielded by provider and/or clinic staff prior to patient discharge.    Final Clinical Impressions / Urgent Care Diagnoses:   Final diagnoses:  Viral URI  At increased risk of exposure to COVID-19 virus  Encounter for laboratory testing for COVID-19 virus  Advice given about COVID-19 virus infection    New Prescriptions:  [place] Controlled Substance Registry consulted? Not Applicable  No orders of the defined types were placed in this encounter.   Recommended Follow up Care:  Patient encouraged to follow up with the following provider within the specified time frame, or sooner as dictated by the severity of her symptoms. As always, she was instructed that for any urgent/emergent care needs, she should seek care either here or in the emergency department for more immediate evaluation.  Follow-up Information    Forrest Moron, MD In 1 week.   Specialty: Internal Medicine Why: General reassessment of symptoms if not improving Contact information: [address] [place] [id number] [phone]  NOTE: This note was prepared using Scientist, clinical (histocompatibility and immunogenetics)Dragon dictation software along with smaller Lobbyistphrase technology. Despite my best ability to proofread, there is the potential that transcriptional errors may still occur from this process, and are completely unintentional.    Verlee MonteGray, Ariez Neilan E, NP 04/01/19 2052

## 2019-04-02 LAB — NOVEL CORONAVIRUS, NAA (HOSP ORDER, SEND-OUT TO REF LAB; TAT 18-24 HRS): SARS-CoV-2, NAA: NOT DETECTED

## 2019-04-17 HISTORY — PX: HERNIA REPAIR: SHX51

## 2019-06-19 ENCOUNTER — Encounter: Payer: Self-pay | Admitting: Family Medicine

## 2019-06-19 DIAGNOSIS — J302 Other seasonal allergic rhinitis: Secondary | ICD-10-CM

## 2019-06-19 MED ORDER — FLUTICASONE PROPIONATE 50 MCG/ACT NA SUSP: 2.0000 | Freq: Every day | NASAL | 6 refills | Status: DC

## 2019-07-31 ENCOUNTER — Other Ambulatory Visit: Payer: Self-pay

## 2019-07-31 ENCOUNTER — Ambulatory Visit: Payer: Managed Care, Other (non HMO) | Admitting: Adult Health Nurse Practitioner

## 2019-07-31 ENCOUNTER — Encounter: Payer: Self-pay | Admitting: Adult Health Nurse Practitioner

## 2019-07-31 VITALS — BP 107/69 | HR 78 | Temp 98.7°F | Ht 66.0 in | Wt 164.2 lb

## 2019-07-31 DIAGNOSIS — K5900 Constipation, unspecified: Secondary | ICD-10-CM

## 2019-07-31 DIAGNOSIS — M6208 Separation of muscle (nontraumatic), other site: Secondary | ICD-10-CM

## 2019-07-31 DIAGNOSIS — K429 Umbilical hernia without obstruction or gangrene: Secondary | ICD-10-CM | POA: Diagnosis not present

## 2019-07-31 NOTE — Progress Notes (Signed)
Chief Complaint  Patient presents with  . Belly button pain    Pt stated that for the past week her belly button has been tender to the touch adn she is haviing less bowel movements than normal. This has been going onn for the past 2wks and she is nery gassey.    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  She exercises frequently, has increased her fiber intake, drinks a lot of water and sometimes only has bowel movements 3 times a week.  She is noticing since she had children she is having increased bloating, discomfort, and difficulty having a bowel movement.  She would take MiraLAX daily which is not necessarily improved her symptoms.  Increased fibrous material in her diet, without much improvement.  She did try the smooth move tea last night was unable to have a bowel movement.  Has not seen GI for evaluation.  Problem List    Problem List: 2021-04: Constipation 7829-56: Umbilical hernia without obstruction and without gangrene 2018-09: Term pregnancy 2018-09: SVD (spontaneous vaginal delivery) 2018-09: Postpartum care following vaginal delivery 2017-06: NSVD (normal spontaneous vaginal delivery) 2017-06: Active labor 2017-06: Group B Streptococcus carrier, antepartum 2017-01: Asthma, mild intermittent 2017-01: Allergic rhinitis 2017-01: Supervision of normal first pregnancy 2015-09: Panic attack 2015-07: Gastroesophageal reflux disease without esophagitis 2014-02: Paresthesia of left arm   Allergies   has No Known Allergies.  Medications    Current Outpatient Medications:  .  albuterol (PROAIR HFA) 108 (90 Base) MCG/ACT inhaler, Inhale 1-2 puffs into the lungs every 4 (four) hours as needed., Disp: 18 g, Rfl: 1 .  etonogestrel (NEXPLANON) 68 MG IMPL implant, 1 each by Subdermal route once., Disp: , Rfl:  .  fluticasone (FLONASE) 50 MCG/ACT nasal spray, Place 2 sprays into both nostrils daily., Disp: 16 g, Rfl: 6 .  Multiple  Vitamin (MULTIVITAMIN WITH MINERALS) TABS tablet, Take 1 tablet by mouth daily., Disp: , Rfl:  .  PREBIOTIC PRODUCT PO, Take by mouth., Disp: , Rfl:  .  Probiotic Product (PROBIOTIC-10 ULTIMATE PO), Take by mouth., Disp: , Rfl:    Review of Systems    Constitutional: Negative for activity change, appetite change, chills and fever.  HENT: Negative for congestion, nosebleeds, trouble swallowing and voice change.   Respiratory: Negative for cough, shortness of breath and wheezing.   Cardiac:  Negative for chest pain, pressure, syncope  Gastrointestinal: Negative for diarrhea, nausea and vomiting.  Genitourinary: Negative for difficulty urinating, dysuria, flank pain and hematuria.  Musculoskeletal: Negative for back pain, joint swelling and neck pain.  Neurological: Negative for dizziness, speech difficulty, light-headedness and numbness.  See HPI. All other review of systems negative.     Physical Exam:    height is 5\' 6"  (1.676 m) and weight is 164 lb 3.2 oz (74.5 kg). Her temporal temperature is 98.7 F (37.1 C). Her blood pressure is 107/69 and her pulse is 78. Her oxygen saturation is 96%.  General appearance: alert, well appearing, and in no distress, oriented to person, place, and time and anxious. Chest: clear to auscultation, no wheezes, rales or rhonchi, symmetric air entry.  CVS exam: normal rate, regular rhythm, normal S1, S2, no murmurs, rubs, clicks or gallops. Skin exam - normal coloration and turgor, no rashes, no suspicious skin lesions noted. Mental Status: normal mood, behavior, speech, dress, motor activity, and thought processes, anxious. GI: Significant for what feels like an umbilical hernia, diastasis recti noted on exam. Lab /Imaging Review  Assessment & Plan:  Elizabeth Williamson is a 29 y.o. female    1. Constipation, unspecified constipation type   2. Umbilical hernia without obstruction and without gangrene    Orders Placed This Encounter  Procedures   . US Abdomen Limited   No orders of the defined types were placed in this encounter. Will follow up after abdominal ultrasound is completed.  In the interim I have encouraged the patient to increase her MiraLAX to twice daily and or add Metamucil but honestly I think that the Metamucil will cause her more bloating which is uncomfortable for her at this time.  May use senna laxatives in the interim to be able to have a bowel movement.  Follow-up after ultrasound to determine next step.  She is in line with this plan.   Elyse Jarvis, NP

## 2019-07-31 NOTE — Patient Instructions (Signed)
° ° ° °  If you have lab work done today you will be contacted with your lab results within the next 2 weeks.  If you have not heard from us then please contact us. The fastest way to get your results is to register for My Chart. ° ° °IF you received an x-ray today, you will receive an invoice from [facility]. Please contact [facility] at [phone] with questions or concerns regarding your invoice.  ° °IF you received labwork today, you will receive an invoice from [facility]. Please contact [facility] at [phone] with questions or concerns regarding your invoice.  ° °Our billing staff will not be able to assist you with questions regarding bills from these companies. ° °You will be contacted with the lab results as soon as they are available. The fastest way to get your results is to activate your My Chart account. Instructions are located on the last page of this paperwork. If you have not heard from us regarding the results in 2 weeks, please contact this office. °  ° ° ° °

## 2019-08-03 ENCOUNTER — Other Ambulatory Visit: Payer: Self-pay | Admitting: Adult Health Nurse Practitioner

## 2019-08-03 ENCOUNTER — Ambulatory Visit
Admission: RE | Admit: 2019-08-03 | Discharge: 2019-08-03 | Disposition: A | Discharge status: Home / Self Care | Payer: Managed Care, Other (non HMO) | Source: Ambulatory Visit | Attending: Adult Health Nurse Practitioner | Admitting: Adult Health Nurse Practitioner

## 2019-08-03 DIAGNOSIS — K429 Umbilical hernia without obstruction or gangrene: Secondary | ICD-10-CM

## 2019-08-03 DIAGNOSIS — K5900 Constipation, unspecified: Secondary | ICD-10-CM

## 2019-08-03 IMAGING — US US ABDOMEN LIMITED
1 series · 6 of 6 positions shown · non-contrast
Comparison: None.

CLINICAL DATA: Umbilical pain with bloating

EXAM:
ULTRASOUND ABDOMEN LIMITED

[Series 1: us abdomen limited · 0.11mm/px · 6 of 6 slices shown]
[im 1/6]
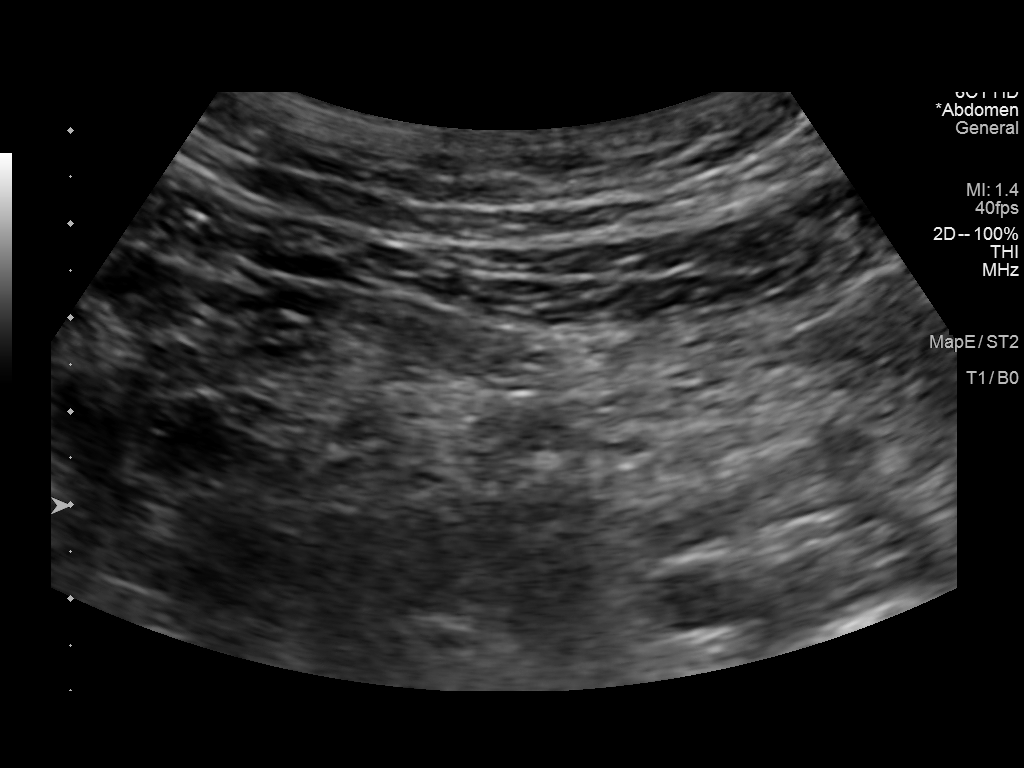
[im 2/6]
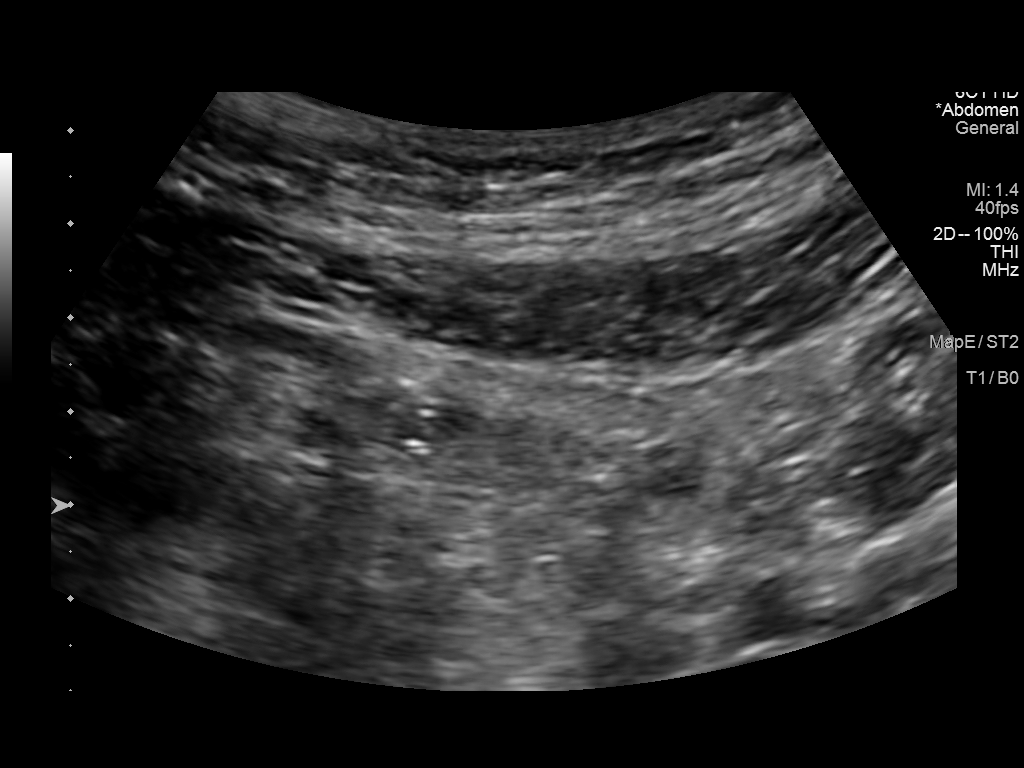
[im 3/6]
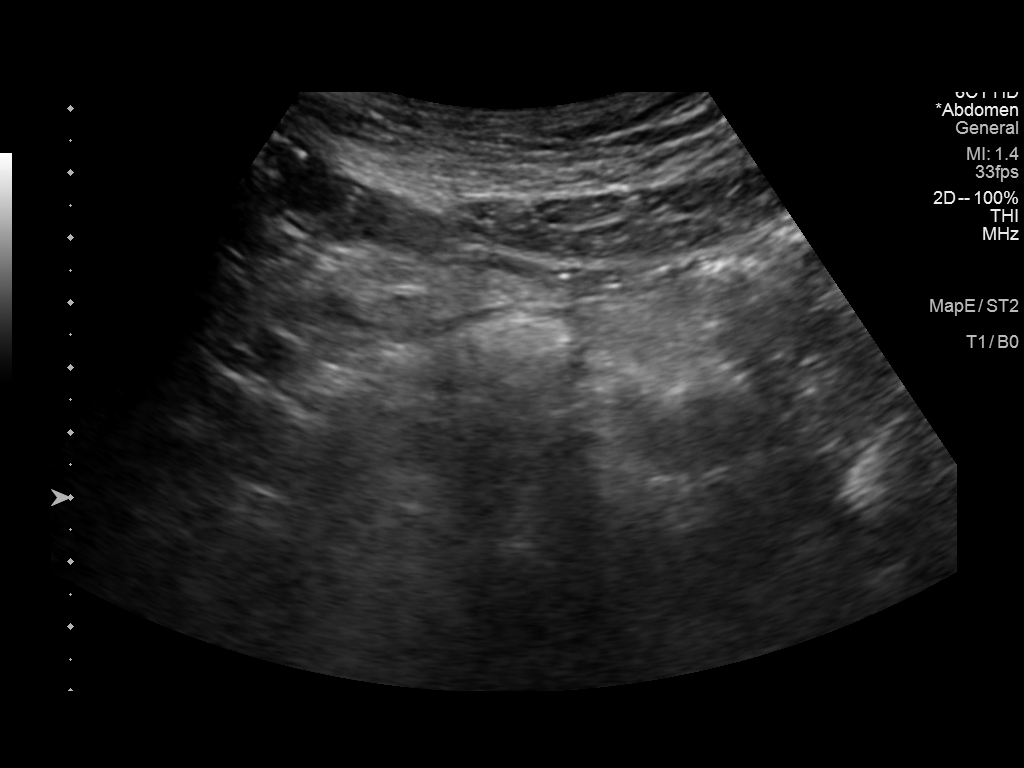
[im 4/6]
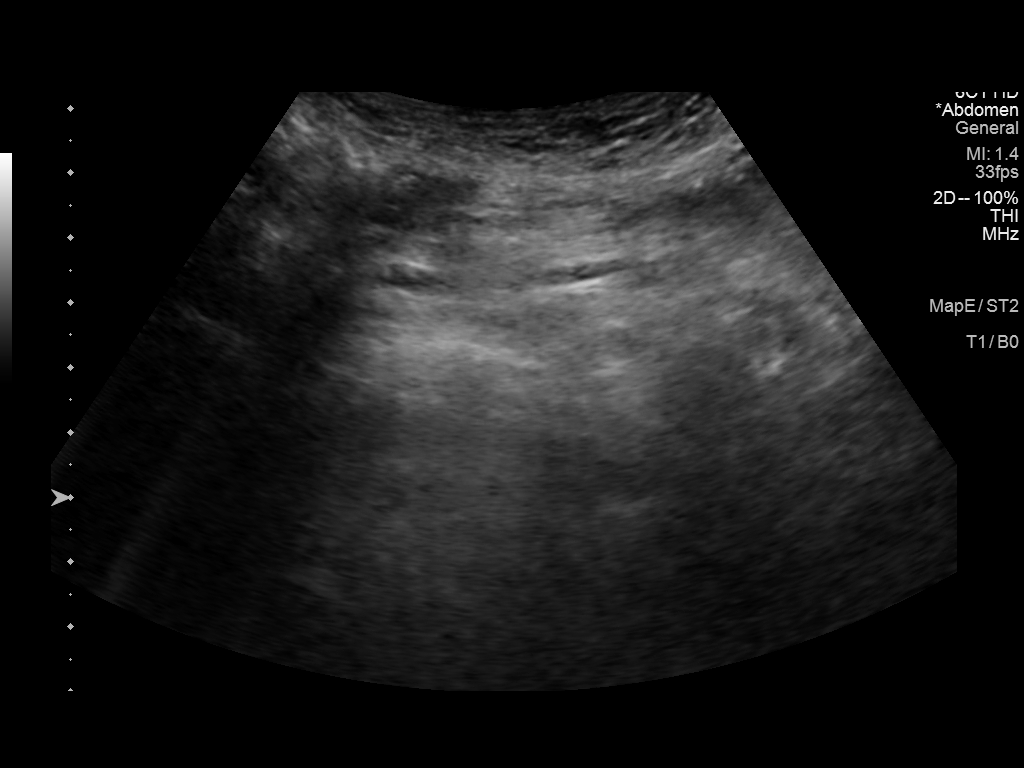
[im 5/6]
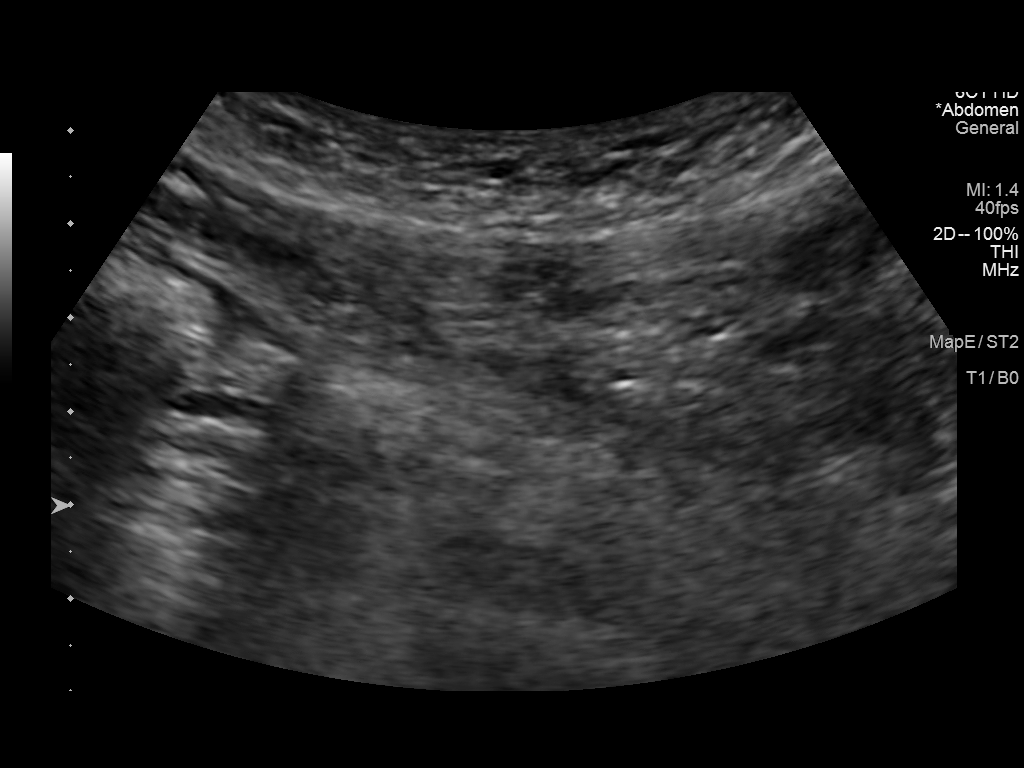
[im 6/6]
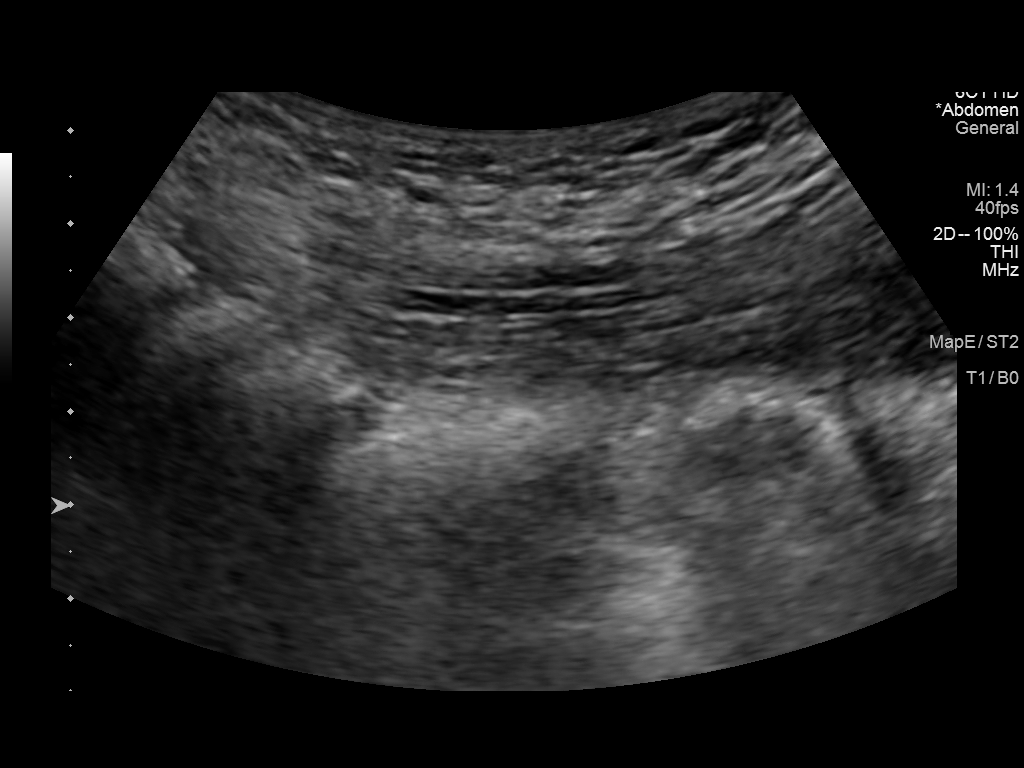

[6 of 6 positions shown; findings below may reference images not displayed]

FINDINGS: Targeted sonography of the umbilical region is performed. No cyst or
solid mass is visualized. Negative for bowel containing hernia in
the region of concern.
IMPRESSION: Negative. No sonographic correlate for umbilical pain. Further
management will need to be based on clinical grounds

## 2019-08-05 ENCOUNTER — Other Ambulatory Visit: Payer: Self-pay

## 2019-08-05 ENCOUNTER — Ambulatory Visit (INDEPENDENT_AMBULATORY_CARE_PROVIDER_SITE_OTHER): Payer: Managed Care, Other (non HMO) | Admitting: Adult Health Nurse Practitioner

## 2019-08-05 ENCOUNTER — Encounter: Payer: Self-pay | Admitting: Adult Health Nurse Practitioner

## 2019-08-05 VITALS — BP 123/73 | HR 80 | Temp 98.4°F | Ht 66.0 in | Wt 159.0 lb

## 2019-08-05 DIAGNOSIS — M6208 Separation of muscle (nontraumatic), other site: Secondary | ICD-10-CM

## 2019-08-05 DIAGNOSIS — M545 Low back pain: Secondary | ICD-10-CM

## 2019-08-05 LAB — POCT URINALYSIS DIP (MANUAL ENTRY)
Bilirubin, UA: NEGATIVE
Blood, UA: NEGATIVE
Glucose, UA: NEGATIVE mg/dL
Ketones, POC UA: NEGATIVE mg/dL
Leukocytes, UA: NEGATIVE
Nitrite, UA: NEGATIVE
Protein Ur, POC: NEGATIVE mg/dL
Spec Grav, UA: 1.02 (ref 1.010–1.025)
Urobilinogen, UA: 1 E.U./dL
pH, UA: >=9 — AB (ref 5.0–8.0)

## 2019-08-05 NOTE — Patient Instructions (Signed)
° ° ° °  If you have lab work done today you will be contacted with your lab results within the next 2 weeks.  If you have not heard from us then please contact us. The fastest way to get your results is to register for My Chart. ° ° °IF you received an x-ray today, you will receive an invoice from [facility]. Please contact [facility] at [phone] with questions or concerns regarding your invoice.  ° °IF you received labwork today, you will receive an invoice from [facility]. Please contact [facility] at [phone] with questions or concerns regarding your invoice.  ° °Our billing staff will not be able to assist you with questions regarding bills from these companies. ° °You will be contacted with the lab results as soon as they are available. The fastest way to get your results is to activate your My Chart account. Instructions are located on the last page of this paperwork. If you have not heard from us regarding the results in 2 weeks, please contact this office. °  ° ° ° °

## 2019-08-06 NOTE — Progress Notes (Signed)
.  cv Subjective:    Patient presents after ultrasound which was negative for hernia.  Here to discuss diastasis recti.  >5 finger breadth separation.  Had a worse day after initially being seen with significant bloating, discomfort, and constipation.  We discussed that due to the degree of separation between abdominal muscles, there is likely pinching or slow transit in the intestine which can be better managed but mechanically will likely not improve until addressed surgically.  She would like to see plastic surgeon Jonna Munro for evaluation as she has experience with this specialist.     PMH: NONE The following portions of the patient's history were reviewed and updated as appropriate: allergies, current medications, past family history, past medical history, past social history, past surgical history and problem list.  Review of Systems Pertinent items noted in HPI and remainder of comprehensive ROS otherwise negative.    Objective:     General:  alert, cooperative and no distress  Neck:  no asymmetry, masses, or scars, supple without significant adenopathy  Lungs:  clear to auscultation bilaterally  Heart:  regular rate and rhythm, S1, S2 normal, no murmur, click, rub or gallop  Abdomen: Diminished bowel sounds and mild discomfort, wearing a binder for compression      Assessment:   1. Diastasis recti      Plan:    1. Reviewed the literature from up to date on potential surgical options which include laproscopic as well as mesh and suture repair.  Surgeon will determine appropriate.  Discussed questions to ask.   2.  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  Will f/u once Plastics evaluates.  She is inline with this plan.   A total of 35 minutes were spent  face-to-face with the patient during this encounter and over half of that time was spent on counseling and coordination of care.  Elyse Jarvis, NP

## 2019-08-21 ENCOUNTER — Encounter: Payer: Self-pay | Admitting: Emergency Medicine

## 2019-08-21 ENCOUNTER — Ambulatory Visit
Admission: EM | Admit: 2019-08-21 | Discharge: 2019-08-21 | Disposition: A | Discharge status: Home / Self Care | Payer: Managed Care, Other (non HMO) | Attending: Urgent Care | Admitting: Urgent Care

## 2019-08-21 ENCOUNTER — Other Ambulatory Visit: Payer: Self-pay

## 2019-08-21 ENCOUNTER — Ambulatory Visit (INDEPENDENT_AMBULATORY_CARE_PROVIDER_SITE_OTHER)
Admit: 2019-08-21 | Discharge: 2019-08-21 | Disposition: A | Discharge status: Home / Self Care | Payer: Managed Care, Other (non HMO)

## 2019-08-21 ENCOUNTER — Telehealth: Payer: Self-pay | Admitting: Family Medicine

## 2019-08-21 DIAGNOSIS — R1033 Periumbilical pain: Secondary | ICD-10-CM | POA: Insufficient documentation

## 2019-08-21 DIAGNOSIS — K5901 Slow transit constipation: Secondary | ICD-10-CM

## 2019-08-21 LAB — CBC
HCT: 39.5 % (ref 36.0–46.0)
Hemoglobin: 12.6 g/dL (ref 12.0–15.0)
MCH: 28.4 pg (ref 26.0–34.0)
MCHC: 31.9 g/dL (ref 30.0–36.0)
MCV: 89 fL (ref 80.0–100.0)
Platelets: 194 10*3/uL (ref 150–400)
RBC: 4.44 MIL/uL (ref 3.87–5.11)
RDW: 12.1 % (ref 11.5–15.5)
WBC: 4.9 10*3/uL (ref 4.0–10.5)
nRBC: 0 % (ref 0.0–0.2)

## 2019-08-21 LAB — BASIC METABOLIC PANEL
Anion gap: 6 (ref 5–15)
BUN: 12 mg/dL (ref 6–20)
CO2: 25 mmol/L (ref 22–32)
Calcium: 8.8 mg/dL — ABNORMAL LOW (ref 8.9–10.3)
Chloride: 107 mmol/L (ref 98–111)
Creatinine, Ser: 0.57 mg/dL (ref 0.44–1.00)
GFR calc Af Amer: >60 mL/min (ref >60)
GFR calc non Af Amer: >60 mL/min (ref >60)
Glucose, Bld: 97 mg/dL (ref 70–99)
Potassium: 4.1 mmol/L (ref 3.5–5.1)
Sodium: 138 mmol/L (ref 135–145)

## 2019-08-21 LAB — URINALYSIS, COMPLETE (UACMP) WITH MICROSCOPIC
Bacteria, UA: NONE SEEN
Bilirubin Urine: NEGATIVE
Glucose, UA: NEGATIVE mg/dL
Hgb urine dipstick: NEGATIVE
Ketones, ur: NEGATIVE mg/dL
Leukocytes,Ua: NEGATIVE
Nitrite: NEGATIVE
Protein, ur: NEGATIVE mg/dL
RBC / HPF: NONE SEEN RBC/hpf (ref 0–5)
Specific Gravity, Urine: 1.01 (ref 1.005–1.030)
pH: 6.5 (ref 5.0–8.0)

## 2019-08-21 LAB — PREGNANCY, URINE: Preg Test, Ur: NEGATIVE

## 2019-08-21 IMAGING — CT CT ABD-PELV W/ CM
1 of 2 series · 15 of 32 positions shown, 19 images · IV contrast (APPLIED)
Comparison: Ultrasound [DATE]

CLINICAL DATA: Periumbilical pain

EXAM:
CT ABDOMEN AND PELVIS WITH CONTRAST
TECHNIQUE: Multidetector CT imaging of the abdomen and pelvis was performed
using the standard protocol following bolus administration of
intravenous contrast.
CONTRAST:  100mL OMNIPAQUE IOHEXOL 300 MG/ML  SOLN

[Series 2: axial st · axial · 0.64mm/px · z∈[-856,-436]mm · 15 of 92 slices shown, 19 images]
[im 4/92  soft-tissue]
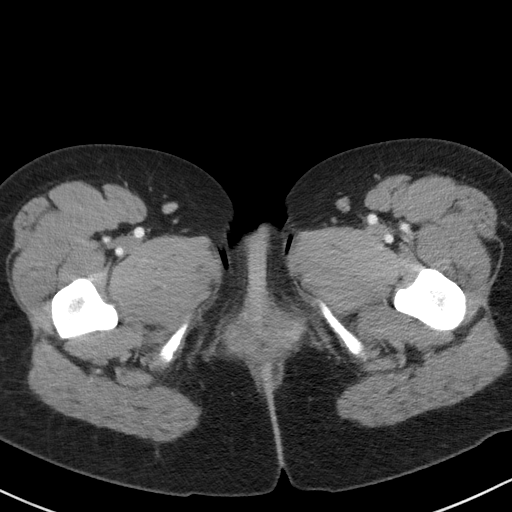
[im 4/92  bone]
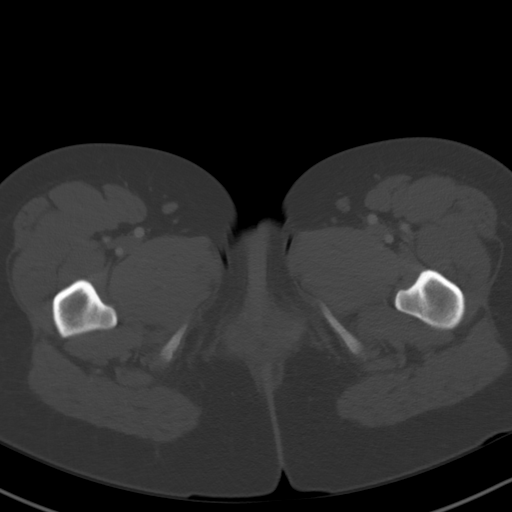
[im 11/92  soft-tissue]
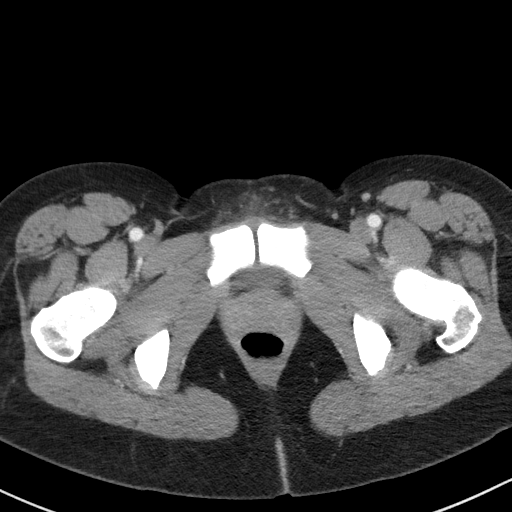
[im 19/92  soft-tissue]
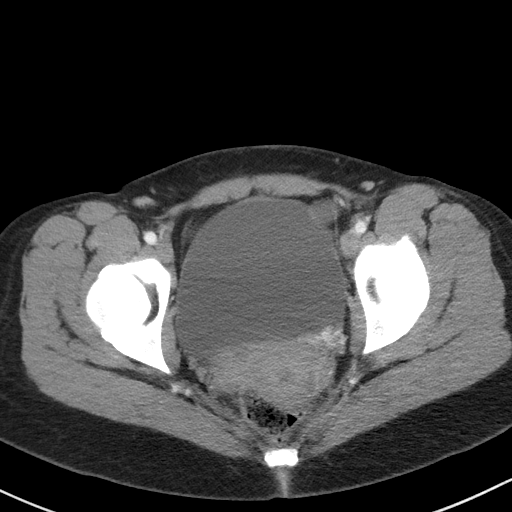
[im 26/92  soft-tissue]
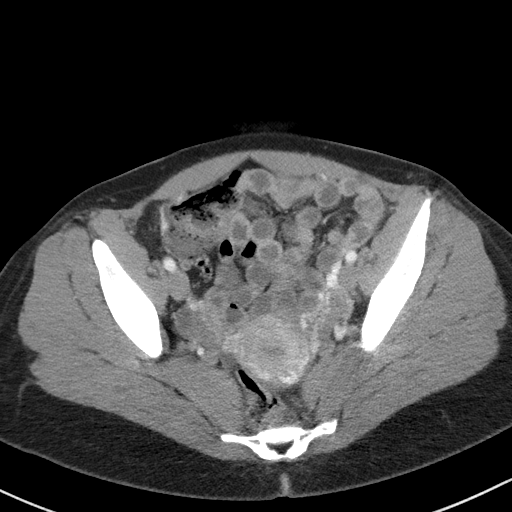
[im 33/92  soft-tissue]
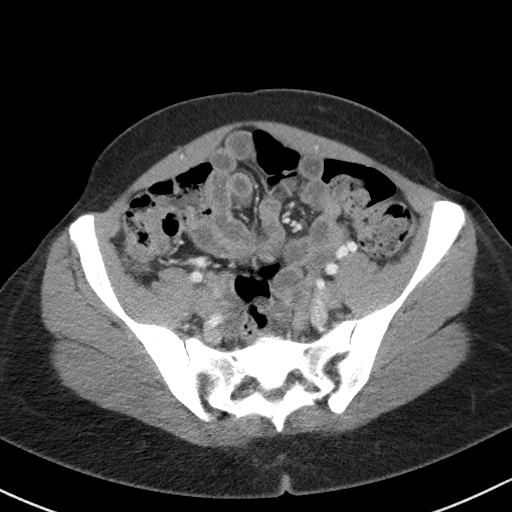
[im 41/92  soft-tissue]
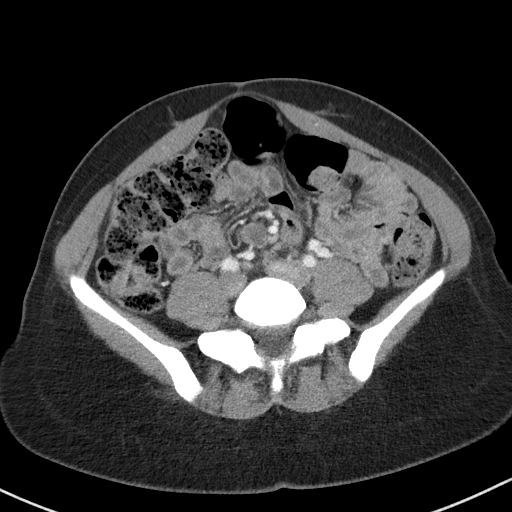
[im 48/92  soft-tissue]
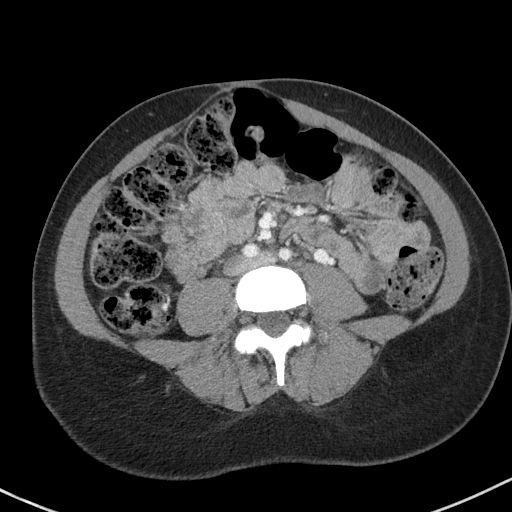
[im 51/92  soft-tissue]
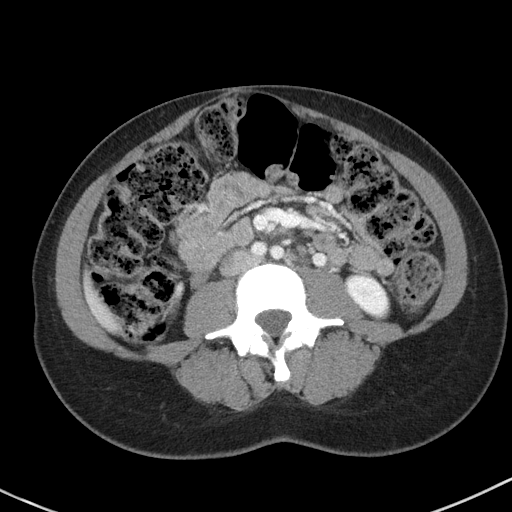
[im 59/92  soft-tissue]
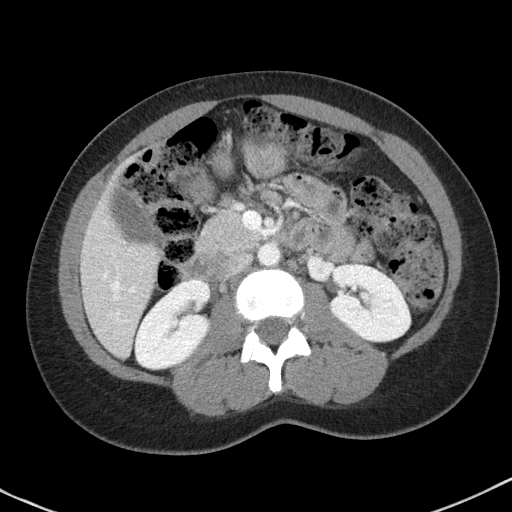
[im 59/92  bone]
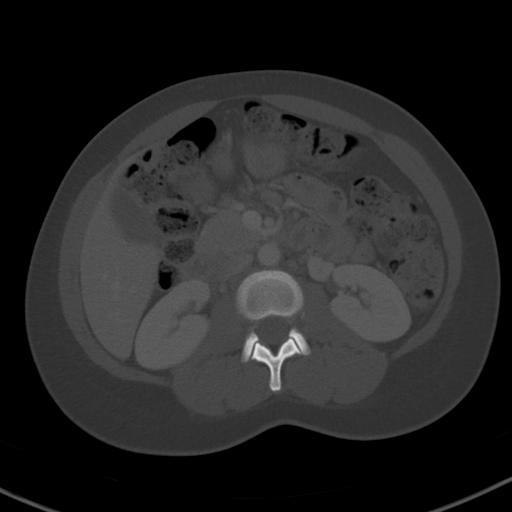
[im 66/92  soft-tissue]
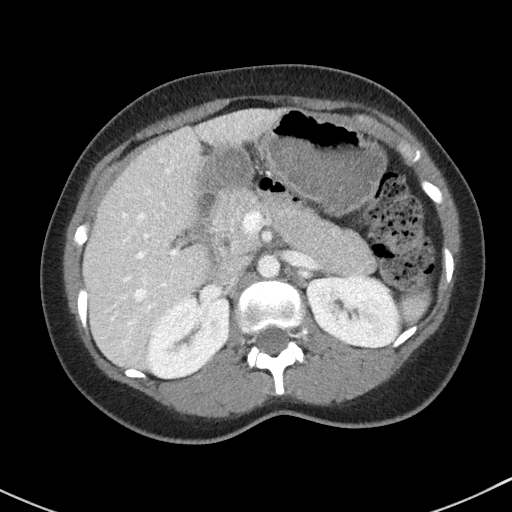
[im 73/92  soft-tissue]
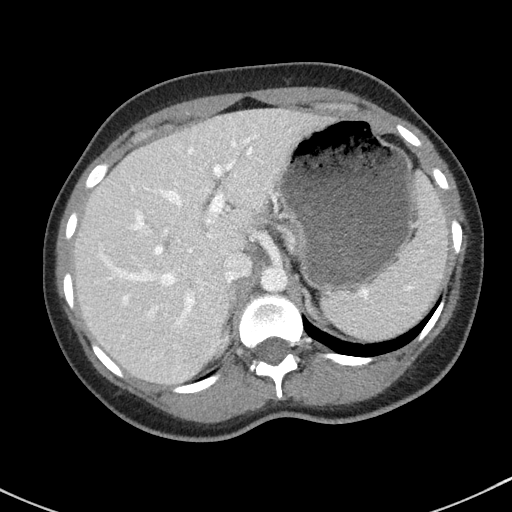
[im 77/92  lung]
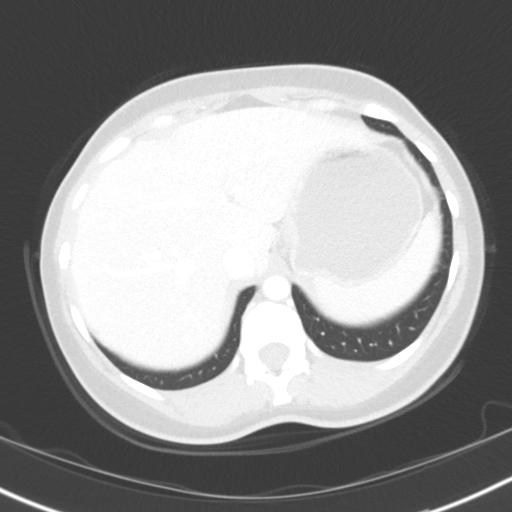
[im 81/92  soft-tissue]
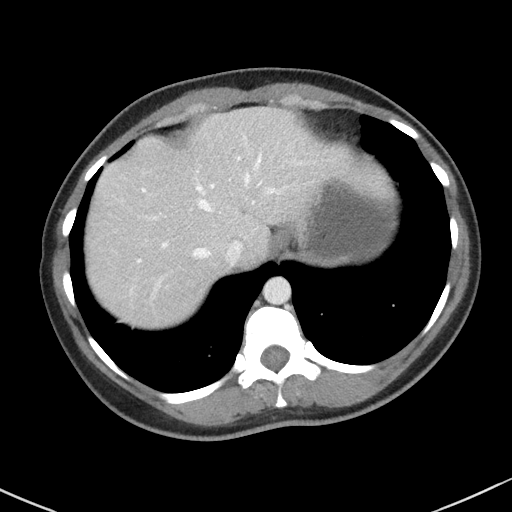
[im 81/92  lung]
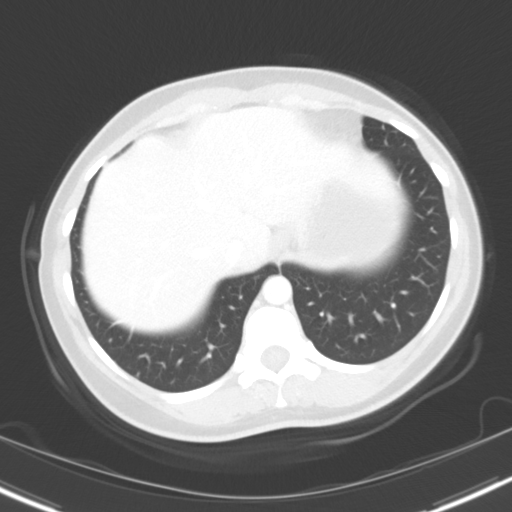
[im 84/92  lung]
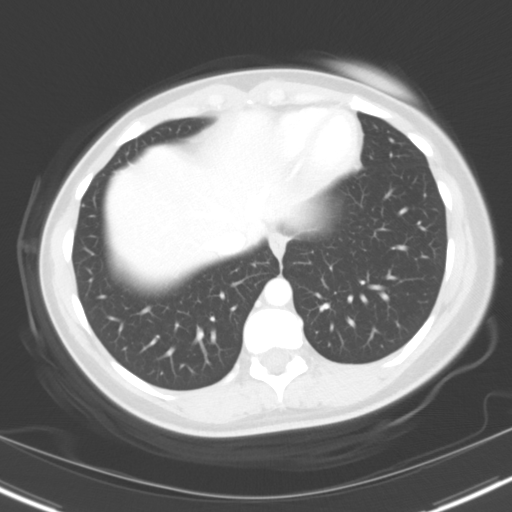
[im 88/92  soft-tissue]
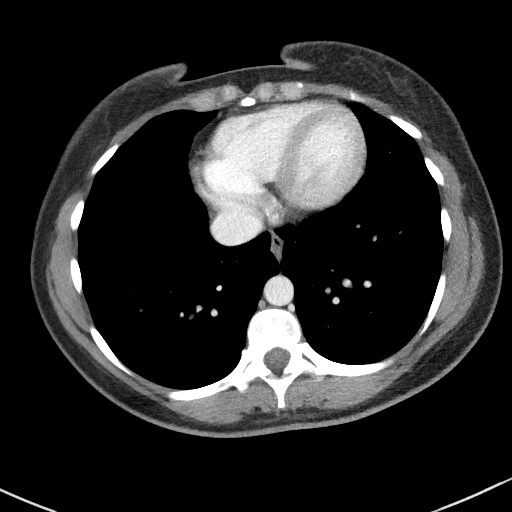
[im 88/92  lung]
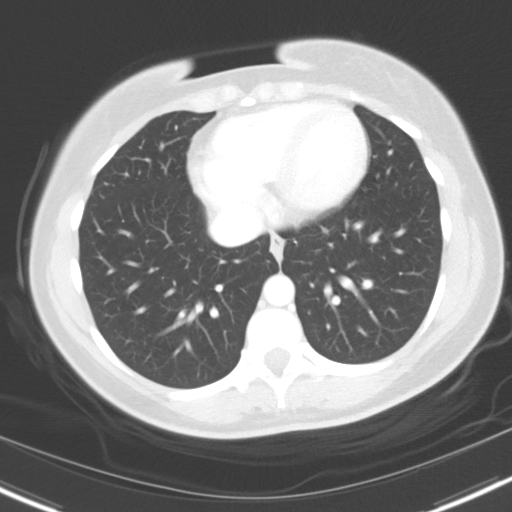

[15 of 32 positions shown; findings below may reference images not displayed]

FINDINGS: Lower chest: No acute abnormality.

Hepatobiliary: No focal liver abnormality is seen. No gallstones,
gallbladder wall thickening, or biliary dilatation.

Pancreas: Unremarkable. No pancreatic ductal dilatation or
surrounding inflammatory changes.

Spleen: Normal in size without focal abnormality.

Adrenals/Urinary Tract: Adrenal glands are unremarkable. Kidneys are
normal, without renal calculi, focal lesion, or hydronephrosis.
Bladder is unremarkable.

Stomach/Bowel: Stomach is within normal limits. Appendix appears
normal. No evidence of bowel wall thickening, distention, or
inflammatory changes. Large quantity of stool throughout the colon.

Vascular/Lymphatic: No significant vascular findings are present. No
enlarged abdominal or pelvic lymph nodes.

Reproductive: Uterus and bilateral adnexa are unremarkable.

Other: Negative for free air or free fluid.

Musculoskeletal: No acute or significant osseous findings.
IMPRESSION: 1. No CT evidence for acute intra-abdominal or pelvic abnormality.
2. Large volume of stool throughout the colon, possible
constipation.

## 2019-08-21 MED ORDER — IOHEXOL 300 MG/ML  SOLN
100.0000 mL | Freq: Once | INTRAMUSCULAR | Status: AC | PRN
  Administered 2019-08-21: 17:00:00 100 mL via INTRAVENOUS

## 2019-08-21 MED ORDER — LACTULOSE 20 GM/30ML PO SOLN: 20.0000 g | Freq: Two times a day (BID) | ORAL | 0 refills | Status: DC | PRN

## 2019-08-21 NOTE — Telephone Encounter (Signed)
Pt called and stated she has seen NP Colette Ribas the last couple appt visits and stated she would like provider to give her a call regarding if she needs to get extra x-rays done because she is still experiencing the lower back pain. [phone] Please advise.

## 2019-08-21 NOTE — Telephone Encounter (Signed)
Pt is requesting xray due to bk pain she is experiencing from ab separation surgery. US done on abdomen listed in imaging. Pt says she has been set up for plastic surgery appt, possibly waiting on a cancellation to get in quicker. Will call bk if the pain becomes is unbearable

## 2019-08-21 NOTE — ED Notes (Signed)
CT approved [id number]

## 2019-08-21 NOTE — ED Triage Notes (Signed)
Patient states that she has been seeing her PCP for abdominal pain.  Patient states that her abdominal pain has gotten worse and that her PCP had her have an US done.  Patient states that her last bowel movement was on Wed.  And states she had soft stool.  Patient states that this morning her pain came back and feels more distended.  Patient states that she does not have a bowel movement unless she takes MOM and a suppository.  Patient states that stool softeners does not help.

## 2019-08-21 NOTE — Discharge Instructions (Addendum)
It was very nice seeing you today in clinic. Thank you for entrusting me with your care.   Please utilize the medications that we discussed. Your prescriptions has been called in to your pharmacy.   Make arrangements to follow up with your regular doctor in 1 week for re-evaluation if not improving. If your symptoms/condition worsens, please seek follow up care either here or in the ER. Please remember, our [facility] providers are "right here with you" when you need us.   Again, it was my pleasure to take care of you today. Thank you for choosing our clinic. I hope that you start to feel better quickly.   Matylda Fehring, MSN, APRN, FNP-C, CEN Advanced Practice Provider [facility] 

## 2019-08-22 ENCOUNTER — Telehealth: Payer: Self-pay | Admitting: Urgent Care

## 2019-08-22 NOTE — Telephone Encounter (Signed)
    Date: 08/22/2019  Name: DARCELLE HERRADA DOB: June 27, 1990 MRN: [id number]  Re: Referral  Patient seen in clinic on 08/21/2019 by MD for abdominal pain and constipation issues. She requested to see GI. Referral entered, however inadvertently went into "pend" status.  New referral entered today for patient to be seen by Dr. Servando Snare at [facility].  Forwarded last office note and recent CT to gastroenterologist for review.  Patient advised during her clinic visit to expect a call from GI early next week.  She was advised to contact the practice if she has not received an appointment by Wednesday.  I assured patient that [facility] would make every effort possible to expeditiously see her and offer suggestions regarding her current issues.  Orders Placed This Encounter  Procedures  . Ambulatory referral to Gastroenterology    Referral Priority:   Routine    Referral Type:   Consultation    Referral Reason:   Specialty Services Required    Referred to Provider:   Midge Minium, MD    Number of Visits Requested:   1   Quentin Mulling, MSN, APRN, FNP-C, CEN Advanced Practice Provider [facility] 08/22/2019 5:02 PM

## 2019-08-22 NOTE — ED Provider Notes (Signed)
[place], [place]   Name: Elizabeth Williamson DOB: [birth year] MRN: [id number] CSN: [id number] PCP: Doristine Bosworth, MD  Arrival date and time:  08/21/19 1336  Chief Complaint:  Abdominal Pain  NOTE: Prior to seeing the patient today, I have reviewed the triage nursing documentation and vital signs. Clinical staff has updated patient's PMH/PSHx, current medication list, and drug allergies/intolerances to ensure comprehensive history available to assist in medical decision making.   History:   HPI: Elizabeth Williamson is a 29 y.o. female who presents today with complaints of diffuse abdominal pain and pain in her lower back that has been persistent for the last 3 weeks.  Patient notes worsening of her symptoms today.  Patient has been seen by her PCP recently for the same; notes reviewed.  Patient underwent ultrasound imaging of her abdomen that resulted as negative.  Patient has been scheduled with plastic surgery to discuss surgical interventions for her postpartum diastasis recti.  Patient reports a "weird sensation" in her abdomen.  She has been experiencing constipation and nausea.  Patient currently prescribed MiraLAX twice daily, however notes that this intervention is relatively ineffective.  She is only able to achieve an adequate bowel movement with MOM or use of a suppository.  Patient notes that her bowel movements are intermittently "like shoestrings".  She is unable to bear down to have an adequate bowel movement due to her diastases recti.  Patient presents to clinic today an abdominal binder.  She is scheduled to see plastic surgery next week for further evaluation.  Past Medical History:  Diagnosis Date  . Alopecia    got injections through dermatologist  . Migraine    rare    Past Surgical History:  Procedure Laterality Date  . NO PAST SURGERIES      Family History  Problem Relation Age of Onset  . Heart disease Brother        recently found to have enlarged heart (age 60)  .  Diabetes Maternal Grandmother   . Cancer Maternal Grandfather        colon cancer (60's)  . Hypertension Paternal Grandmother   . Cancer Paternal Uncle        lung cancer (nonsmoker)    Social History   Tobacco Use  . Smoking status: Former Smoker    Packs/day: 0.50    Years: 3.00    Pack years: 1.50    Types: Cigarettes    Quit date: 08/14/2013    Years since quitting: 6.0  . Smokeless tobacco: Never Used  . Tobacco comment: increased to 3 pack/week  Substance Use Topics  . Alcohol use: Yes    Alcohol/week: 0.0 standard drinks    Comment: Occasional social drinker, every other weekend  . Drug use: No    Patient Active Problem List   Diagnosis Date Noted  . Constipation 07/31/2019  . Diastasis recti 07/31/2019  . Term pregnancy 12/15/2016  . SVD (spontaneous vaginal delivery) 12/15/2016  . Postpartum care following vaginal delivery 12/15/2016  . NSVD (normal spontaneous vaginal delivery) 10/14/2015  . Active labor 10/11/2015  . Group B Streptococcus carrier, antepartum 09/17/2015  . Asthma, mild intermittent 05/06/2015  . Allergic rhinitis 05/06/2015  . Supervision of normal first pregnancy 05/06/2015  . Gastroesophageal reflux disease without esophagitis 11/09/2013    Home Medications:    Current Meds  Medication Sig  . albuterol (PROAIR HFA) 108 (90 Base) MCG/ACT inhaler Inhale 1-2 puffs into the lungs every 4 (four) hours as needed.  Marland Kitchen  etonogestrel (NEXPLANON) 68 MG IMPL implant 1 each by Subdermal route once.  . fluticasone (FLONASE) 50 MCG/ACT nasal spray Place 2 sprays into both nostrils daily.  . Multiple Vitamin (MULTIVITAMIN WITH MINERALS) TABS tablet Take 1 tablet by mouth daily.  . Probiotic Product (PROBIOTIC-10 ULTIMATE PO) Take by mouth.    Allergies:   Patient has no known allergies.  Review of Systems (ROS):  Review of systems NEGATIVE unless otherwise noted in narrative H&P section.   Vital Signs: Today's Vitals   08/21/19 1409 08/21/19  1410 08/21/19 1412 08/21/19 1724  BP:   111/77   Pulse:   94   Resp:   14   Temp:   98.5 F (36.9 C)   TempSrc:   Oral   SpO2:   99%   Weight:  158 lb (71.7 kg)    Height:  5\' 7"  (1.702 m)    PainSc: 6    6     Physical Exam: Physical Exam  Constitutional: She is oriented to person, place, and time and well-developed, well-nourished, and in no distress.  HENT:  Head: Normocephalic and atraumatic.  Nose: Nose normal.  Mouth/Throat: Uvula is midline, oropharynx is clear and moist and mucous membranes are normal.  Eyes: Pupils are equal, round, and reactive to light.  Cardiovascular: Normal rate, regular rhythm, normal heart sounds and intact distal pulses.  Pulmonary/Chest: Effort normal and breath sounds normal.  Abdominal: Normal appearance. She exhibits no distension. Bowel sounds are not hypoactive. There is generalized abdominal tenderness. There is no rebound, no guarding and no CVA tenderness.  Abdominal pain with increased severity to periumbilical area.  Neurological: She is alert and oriented to person, place, and time. Gait normal.  Skin: Skin is warm and dry. No rash noted. She is not diaphoretic.  Psychiatric: Memory, affect and judgment normal. Her mood appears anxious.  Nursing note and vitals reviewed.   Urgent Care Treatments / Results:   Orders Placed This Encounter  Procedures  . CT ABDOMEN PELVIS W CONTRAST  . Urinalysis, Complete w Microscopic  . Pregnancy, urine  . CBC  . Basic metabolic panel    LABS: PLEASE NOTE: all labs that were ordered this encounter are listed, however only abnormal results are displayed. Labs Reviewed  URINALYSIS, COMPLETE (UACMP) WITH MICROSCOPIC - Abnormal; Notable for the following components:      Result Value   Color, Urine STRAW (*)    All other components within normal limits  BASIC METABOLIC PANEL - Abnormal; Notable for the following components:   Calcium 8.8 (*)    All other components within normal limits   PREGNANCY, URINE  CBC    EKG: -None  RADIOLOGY: CT ABDOMEN PELVIS W CONTRAST  Result Date: 08/21/2019 CLINICAL DATA:  Periumbilical pain EXAM: CT ABDOMEN AND PELVIS WITH CONTRAST TECHNIQUE: Multidetector CT imaging of the abdomen and pelvis was performed using the standard protocol following bolus administration of intravenous contrast. CONTRAST:  10/21/2019 OMNIPAQUE IOHEXOL 300 MG/ML  SOLN COMPARISON:  Ultrasound 08/03/2019 FINDINGS: Lower chest: No acute abnormality. Hepatobiliary: No focal liver abnormality is seen. No gallstones, gallbladder wall thickening, or biliary dilatation. Pancreas: Unremarkable. No pancreatic ductal dilatation or surrounding inflammatory changes. Spleen: Normal in size without focal abnormality. Adrenals/Urinary Tract: Adrenal glands are unremarkable. Kidneys are normal, without renal calculi, focal lesion, or hydronephrosis. Bladder is unremarkable. Stomach/Bowel: Stomach is within normal limits. Appendix appears normal. No evidence of bowel wall thickening, distention, or inflammatory changes. Large quantity of stool throughout the colon. Vascular/Lymphatic:  No significant vascular findings are present. No enlarged abdominal or pelvic lymph nodes. Reproductive: Uterus and bilateral adnexa are unremarkable. Other: Negative for free air or free fluid. Musculoskeletal: No acute or significant osseous findings. IMPRESSION: 1. No CT evidence for acute intra-abdominal or pelvic abnormality. 2. Large volume of stool throughout the colon, possible constipation. Electronically Signed   By: Donavan Foil M.D.   On: 08/21/2019 16:56    PROCEDURES: Procedures  MEDICATIONS RECEIVED THIS VISIT: Medications - No data to display  PERTINENT CLINICAL COURSE NOTES/UPDATES:   Initial Impression / Assessment and Plan / Urgent Care Course:  Pertinent labs & imaging results that were available during my care of the patient were personally reviewed by me and considered in my medical  decision making (see lab/imaging section of note for values and interpretations).  LIYAT FAULKENBERRY is a 29 y.o. female who presents to [facility] today with complaints of Abdominal Pain  Patient is well appearing overall in clinic today. She does not appear to be in any acute distress. Presenting symptoms (see HPI) and exam as documented above.  Patient with 3-week history of abdominal pain and worsening lower back pain.  She has had ultrasound imaging of her abdomen that was negative.  Patient with significant constipation and nausea despite conservative interventions.  Will pursue further work-up as follows:   WBC 4.9.  Hemoglobin 12.6, hematocrit 39.5, MCV 89.0, MCH 28.4, and platelets 194,000.   BMP unremarkable.  UA negative for infection.  Urine hCG negative for pregnancy.   CT imaging of the abdomen and pelvis revealed a large volume of stool throughout the colon.  Diastasis recti not commented on by radiologist.  Work-up results reviewed in detail with patient.  Discussed unremarkable labs (blood and urine).  Discussed significant constipation despite use of interventions prescribed by her PCP.  Reviewed with patient that constipation is likely due to her weak abdominal muscles resulting from her diastasis recti. Will change to an osmotic laxative using lactulose 20 g twice daily PRN constipation.  Patient requesting a referral to gastroenterology; will send referral to [facility].  Patient to be seen in consult by plastic surgery next week as planned.  In the interim, patient encouraged to maintain adequate hydration and dietary fiber intake.  She was encouraged to follow-up with her PCP for any immediate needs.    I have reviewed the follow up and strict return precautions for any new or worsening symptoms. Patient is aware of symptoms that would be deemed urgent/emergent, and would thus require further evaluation either here or in the emergency department. At the time of  discharge, she verbalized understanding and consent with the discharge plan as it was reviewed with her. All questions were fielded by provider and/or clinic staff prior to patient discharge.    Final Clinical Impressions / Urgent Care Diagnoses:   Final diagnoses:  Slow transit constipation  Periumbilical abdominal pain    New Prescriptions:  [place] Controlled Substance Registry consulted? Not Applicable  Meds ordered this encounter  Medications  . Lactulose 20 GM/30ML SOLN    Sig: Take 30 mLs (20 g total) by mouth 2 (two) times daily as needed.    Dispense:  300 mL    Refill:  0    Recommended Follow up Care:  Patient encouraged to follow up with the following provider within the specified time frame, or sooner as dictated by the severity of her symptoms. As always, she was instructed that for any urgent/emergent care needs,  she should seek care either here or in the emergency department for more immediate evaluation.  Follow-up Information    Doristine Bosworth, MD In 1 week.   Specialty: Internal Medicine Why: General reassessment of symptoms if not improving Contact information: [address] [place] [address] [phone]         NOTE: This note was prepared using Dragon dictation software along with smaller phrase technology. Despite my best ability to proofread, there is the potential that transcriptional errors may still occur from this process, and are completely unintentional.    Verlee Monte, NP 08/22/19 1700

## 2019-09-23 ENCOUNTER — Ambulatory Visit: Payer: Managed Care, Other (non HMO) | Admitting: Physician Assistant

## 2019-09-23 ENCOUNTER — Other Ambulatory Visit: Payer: Self-pay

## 2019-09-23 VITALS — BP 106/64 | HR 83 | Resp 16 | Ht 67.0 in | Wt 163.0 lb

## 2019-09-23 DIAGNOSIS — Z008 Encounter for other general examination: Secondary | ICD-10-CM

## 2019-09-23 NOTE — Progress Notes (Signed)
Subjective:    Patient ID: Elizabeth Williamson, female    DOB: November 24, 1990, 29 y.o.   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  Has had issues with diastasis recti since carrying  9'8" first preg and 8 ' second preg- both with NSD, . Umbilical hernia    Hx GERD Reports abdominal exercises and weight management have been effective until the last few months -  Reports onset of difficulty with constipation and increased abdominal discomfort with bearing down with same.  Back pain increasing Has used high fiber diet, increased fluids , Miralax, with varying success Longstanding umbilical hernia becoming more uncomfortable Recent ER evaluation : Dx slow transit constipation  Undergoing consultations Anticipating abdominoplasty near future  PCP leaving practice- has seen another and will transfer care within the same practice she anticipates  Review of Systems Past hx smoker  D/C 08/2013 Asthma - albuterol Seasonal allergies - fluticasone Nexplanon- no additional pregnancies planned    Objective:   Physical Exam Vitals reviewed.  Constitutional:      General: She is not in acute distress.    Appearance: Normal appearance.     Comments: overweight  HENT:     Head: Normocephalic and atraumatic.     Right Ear: Tympanic membrane and ear canal normal.     Left Ear: Tympanic membrane normal.     Ears:     Comments: Small amount cerumen left canal non-obscuring  Qtip precautions reviewed    Nose: Nose normal.     Mouth/Throat:     Mouth: Mucous membranes are moist.     Comments: DDS q 6 months Eyes:     Extraocular Movements: Extraocular movements intact.  Cardiovascular:     Rate and Rhythm: Normal rate and regular rhythm.     Pulses: Normal pulses.  Pulmonary:     Effort: Pulmonary effort is normal.     Breath sounds: Normal breath sounds.  Abdominal:    General: Bowel sounds are normal. There is no distension.     Palpations: Abdomen is soft.     Tenderness: There is no abdominal tenderness.     Hernia: A hernia is present.     Comments: diastasis present- fingertip to 2-3 cm superior,  Umbilical hernia, reduces without pain   Genitourinary:    Comments: defer Musculoskeletal:        General: Normal range of motion.     Cervical back: Normal range of motion.     Comments: ambulatory  On/off table without assistance Abdominal muscles palpated with patient- relaxed with with head lift Umbilical hernia palpated with patient  Toe walk, heel walk,squat return without hesitation     Skin:    General: Skin is warm and dry.     Capillary Refill: Capillary refill takes less than 2 seconds.  Neurological:     General: No focal deficit present.     Mental Status: She is alert.  Psychiatric:        Mood and Affect: Mood normal.        Behavior: Behavior normal.       Assessment & Plan:  Screening exam completed Labs are pending, will be reported  Questions fielded, recommendations made Encourage 30 minutes per day brisk walk- for stress relief, relaxation and cardiovascular support  Good posture; abdominal exercises reviewed to maximize strength  Follow up with PCP and consultants as indicated

## 2019-09-24 LAB — LIPID PANEL
Chol/HDL Ratio: 1.8 ratio (ref 0.0–4.4)
Cholesterol, Total: 196 mg/dL (ref 100–199)
HDL: 107 mg/dL (ref >39)
LDL Chol Calc (NIH): 81 mg/dL (ref 0–99)
Triglycerides: 36 mg/dL (ref 0–149)
VLDL Cholesterol Cal: 8 mg/dL (ref 5–40)

## 2019-09-24 LAB — GLUCOSE, RANDOM: Glucose: 90 mg/dL (ref 65–99)

## 2019-09-30 ENCOUNTER — Other Ambulatory Visit: Payer: Self-pay

## 2019-09-30 ENCOUNTER — Ambulatory Visit (INDEPENDENT_AMBULATORY_CARE_PROVIDER_SITE_OTHER): Payer: Managed Care, Other (non HMO) | Admitting: Gastroenterology

## 2019-09-30 ENCOUNTER — Encounter: Payer: Self-pay | Admitting: Gastroenterology

## 2019-09-30 VITALS — BP 110/65 | HR 84 | Temp 97.8°F | Ht 67.0 in | Wt 165.6 lb

## 2019-09-30 DIAGNOSIS — K5904 Chronic idiopathic constipation: Secondary | ICD-10-CM

## 2019-09-30 NOTE — Progress Notes (Signed)
Gastroenterology Consultation  Referring Provider:     Forrest Moron, MD Primary Care Physician:  Forrest Moron, MD Primary Gastroenterologist:  Dr. Allen Norris     Reason for Consultation:     Constipation        HPI:   Elizabeth Williamson is a 29 y.o. y/o female referred for consultation & management of constipation by Dr. Nolon Rod, Arlie Solomons, MD.  This patient comes in with a long standing history of constipation.  The patient states she has been having issues with constipation with 1 bowel movement on average a week for many years.  She states it started to get worse after her last child was born 2 years ago.  She also has abdominal wall diathesis and is seeking repair of this by surgery.  She reports that she has soft stools and never really has hard stools but it can be more than a week before she has a bowel movement.  The patient was told to take MiraLAX but started to have no increased frequency of bowel movements but when she did have a bowel movement it was much more watery. The patient denies any rectal bleeding unexplained weight loss fevers chills nausea vomiting family history of colon cancer or family history of colon polyps.   Past Medical History:  Diagnosis Date   Alopecia    got injections through dermatologist   Migraine    rare    Past Surgical History:  Procedure Laterality Date   NO PAST SURGERIES      Prior to Admission medications   Medication Sig Start Date End Date Taking? Authorizing Provider  albuterol (PROAIR HFA) 108 (90 Base) MCG/ACT inhaler Inhale 1-2 puffs into the lungs every 4 (four) hours as needed. 10/27/18  Yes Fransico Meadow, PA-C  etonogestrel (NEXPLANON) 68 MG IMPL implant 1 each by Subdermal route once.   Yes [provider]  fluticasone (FLONASE) 50 MCG/ACT nasal spray Place 2 sprays into both nostrils daily. 06/19/19  Yes Forrest Moron, MD  Multiple Vitamin (MULTIVITAMIN WITH MINERALS) TABS tablet Take 1 tablet by mouth daily.    Yes [provider]  PREBIOTIC PRODUCT PO Take by mouth.   Yes [provider]  Probiotic Product (PROBIOTIC-10 ULTIMATE PO) Take by mouth.   Yes [provider]  Lactulose 20 GM/30ML SOLN Take 30 mLs (20 g total) by mouth 2 (two) times daily as needed. Patient not taking: Reported on 09/23/2019 08/21/19   Karen Kitchens, NP    Family History  Problem Relation Age of Onset   Heart disease Brother        recently found to have enlarged heart (age 70)   Diabetes Maternal Grandmother    Cancer Maternal Grandfather        colon cancer (60's)   Hypertension Paternal Grandmother    Cancer Paternal Uncle        lung cancer (nonsmoker)     Social History   Tobacco Use   Smoking status: Former Smoker    Packs/day: 0.50    Years: 3.00    Pack years: 1.50    Types: Cigarettes    Quit date: 08/14/2013    Years since quitting: 6.1   Smokeless tobacco: Never Used   Tobacco comment: increased to 3 pack/week  Vaping Use   Vaping Use: Never used  Substance Use Topics   Alcohol use: Yes    Alcohol/week: 0.0 standard drinks    Comment: Occasional social drinker, every  other weekend   Drug use: No    Allergies as of 09/30/2019   (No Known Allergies)    Review of Systems:    All systems reviewed and negative except where noted in HPI.   Physical Exam:  BP 110/65    Pulse 84    Temp 97.8 F (36.6 C) (Temporal)    Ht 5\' 7"  (1.702 m)    Wt 165 lb 9.6 oz (75.1 kg)    BMI 25.94 kg/m  No LMP recorded. Patient has had an implant. General:   Alert,  Well-developed, well-nourished, pleasant and cooperative in NAD Head:  Normocephalic and atraumatic. Eyes:  Sclera clear, no icterus.   Conjunctiva pink. Ears:  Normal auditory acuity. Neck:  Supple; no masses or thyromegaly. Lungs:  Respirations even and unlabored.  Clear throughout to auscultation.   No wheezes, crackles, or rhonchi. No acute distress. Heart:  Regular rate and rhythm; no murmurs, clicks,  rubs, or gallops. Abdomen:  Normal bowel sounds.  No bruits.  Soft, non-tender and non-distended without masses, hepatosplenomegaly or hernias noted.  No guarding or rebound tenderness.  Negative Carnett sign.   Rectal:  Deferred.  Pulses:  Normal pulses noted. Extremities:  No clubbing or edema.  No cyanosis. Neurologic:  Alert and oriented x3;  grossly normal neurologically. Skin:  Intact without significant lesions or rashes.  No jaundice. Lymph Nodes:  No significant cervical adenopathy. Psych:  Alert and cooperative. Normal mood and affect.  Imaging Studies: No results found.  Assessment and Plan:   Elizabeth Williamson is a 29 y.o. y/o female who comes in today with a history of chronic constipation with some bloating but no rectal bleeding or worry symptoms.  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  The patient has been given samples of incest 72 mcg and 145 mcg.  The patient will try the lower dose and if it does not help she will go to the higher dose.  The patient has been told to stop if she gets diarrhea.  She has also been told to add Citrucel to her daily routine to better help her completely evacuate her stools and decrease her symptoms.  The patient has been explained the plan and agrees with it.    38, MD. Midge Minium    Note: This dictation was prepared with Dragon dictation along with smaller phrase technology. Any transcriptional errors that result from this process are unintentional.

## 2019-10-13 ENCOUNTER — Other Ambulatory Visit: Payer: Self-pay

## 2019-10-13 MED ORDER — LINACLOTIDE 72 MCG PO CAPS: 72.0000 ug | ORAL_CAPSULE | Freq: Every day | ORAL | 6 refills | Status: DC

## 2019-12-18 ENCOUNTER — Ambulatory Visit: Payer: Managed Care, Other (non HMO) | Admitting: Physician Assistant

## 2019-12-18 ENCOUNTER — Other Ambulatory Visit: Payer: Self-pay

## 2019-12-18 VITALS — BP 110/70 | HR 79 | Temp 97.9°F | Resp 15 | Ht 66.0 in | Wt 168.0 lb

## 2019-12-18 DIAGNOSIS — J328 Other chronic sinusitis: Secondary | ICD-10-CM

## 2019-12-18 NOTE — Patient Instructions (Signed)
Please increase fluids. Please use Tylenol every four hours for fever, aching. please wash hands frequently , and use your mask. Use Prednisone 20 milligrams , two tablets they one through 5 with food. Continue your Zyrtec as previously ordered.

## 2019-12-18 NOTE — Progress Notes (Signed)
   Subjective:    Patient ID: Elizabeth Williamson, female    DOB: 1990/07/02, 29 y.o.   MRN: [id number]  HPI  Pt is a 29 y/o female who presents to the clinic with c/o sinus congestion and right ear pain. This started 1 week ago. She has pressure behind the eyes. Pain and pressure of the ears, right greater than left. No c/o fever, chills, n/v, diarrhea, or rash. Pt has received both COVID vaccine doses.    Review of Systems Sinus headache, right ear pain. All other systems are negative.    Objective:   Physical Exam B/P 110/70. P. 70. R. 15. T. 97.9. Pulse ox. 99%  ears: tympanic membranes are visualized. There's no redness or bulging present. Have the external auditory canals are clear. Mouth and throat: the oral  area is clear. There is mild increased redness of the posterior throat. The uvula is in the midline . The airway is open. Speech is understandable. Weston Brass: full range of motion noted. No cervical lymph adenopathy. Lungs clear. Heart: regular rate and rhythm. No murmurs, galops, or rub. Abdomen: soft with good bowel sounds. Extremities: full range of motion of upper and lower extremities .       Assessment & Plan:

## 2019-12-23 NOTE — Addendum Note (Signed)
Addended by: Janne Napoleon on: 12/23/2019 04:55 PM   Modules accepted: Level of Service

## 2020-07-12 ENCOUNTER — Ambulatory Visit (INDEPENDENT_AMBULATORY_CARE_PROVIDER_SITE_OTHER): Payer: Managed Care, Other (non HMO)

## 2020-07-12 ENCOUNTER — Encounter: Payer: Self-pay | Admitting: Emergency Medicine

## 2020-07-12 ENCOUNTER — Other Ambulatory Visit: Payer: Self-pay

## 2020-07-12 ENCOUNTER — Ambulatory Visit
Admission: EM | Admit: 2020-07-12 | Discharge: 2020-07-12 | Disposition: A | Discharge status: Home / Self Care | Payer: Managed Care, Other (non HMO) | Attending: Family Medicine | Admitting: Family Medicine

## 2020-07-12 DIAGNOSIS — R110 Nausea: Secondary | ICD-10-CM

## 2020-07-12 DIAGNOSIS — M545 Low back pain, unspecified: Secondary | ICD-10-CM

## 2020-07-12 LAB — URINALYSIS, COMPLETE (UACMP) WITH MICROSCOPIC
Bilirubin Urine: NEGATIVE
Glucose, UA: NEGATIVE mg/dL
Ketones, ur: NEGATIVE mg/dL
Leukocytes,Ua: NEGATIVE
Nitrite: NEGATIVE
Protein, ur: NEGATIVE mg/dL
Specific Gravity, Urine: 1.025 (ref 1.005–1.030)
pH: 6.5 (ref 5.0–8.0)

## 2020-07-12 LAB — BASIC METABOLIC PANEL
Anion gap: 5 (ref 5–15)
BUN: 12 mg/dL (ref 6–20)
CO2: 27 mmol/L (ref 22–32)
Calcium: 9.5 mg/dL (ref 8.9–10.3)
Chloride: 105 mmol/L (ref 98–111)
Creatinine, Ser: 0.69 mg/dL (ref 0.44–1.00)
GFR, Estimated: >60 mL/min (ref >60)
Glucose, Bld: 83 mg/dL (ref 70–99)
Potassium: 4.4 mmol/L (ref 3.5–5.1)
Sodium: 137 mmol/L (ref 135–145)

## 2020-07-12 LAB — CBC WITH DIFFERENTIAL/PLATELET
Abs Immature Granulocytes: 0.01 10*3/uL (ref 0.00–0.07)
Basophils Absolute: 0 10*3/uL (ref 0.0–0.1)
Basophils Relative: 1 %
Eosinophils Absolute: 0.2 10*3/uL (ref 0.0–0.5)
Eosinophils Relative: 5 %
HCT: 40.3 % (ref 36.0–46.0)
Hemoglobin: 12.9 g/dL (ref 12.0–15.0)
Immature Granulocytes: 0 %
Lymphocytes Relative: 50 %
Lymphs Abs: 2 10*3/uL (ref 0.7–4.0)
MCH: 27.9 pg (ref 26.0–34.0)
MCHC: 32 g/dL (ref 30.0–36.0)
MCV: 87 fL (ref 80.0–100.0)
Monocytes Absolute: 0.3 10*3/uL (ref 0.1–1.0)
Monocytes Relative: 8 %
Neutro Abs: 1.4 10*3/uL — ABNORMAL LOW (ref 1.7–7.7)
Neutrophils Relative %: 36 %
Platelets: 262 10*3/uL (ref 150–400)
RBC: 4.63 MIL/uL (ref 3.87–5.11)
RDW: 12.1 % (ref 11.5–15.5)
WBC: 4 10*3/uL (ref 4.0–10.5)
nRBC: 0 % (ref 0.0–0.2)

## 2020-07-12 LAB — PREGNANCY, URINE: Preg Test, Ur: NEGATIVE

## 2020-07-12 IMAGING — CR DG ABDOMEN 1V
2 series · 2 of 2 positions shown · non-contrast
Comparison: CT abdomen and pelvis [DATE]

CLINICAL DATA: Low back pain for 2 weeks.  Nausea.

EXAM:
ABDOMEN - 1 VIEW

[abdomen kub (1 of 2)]
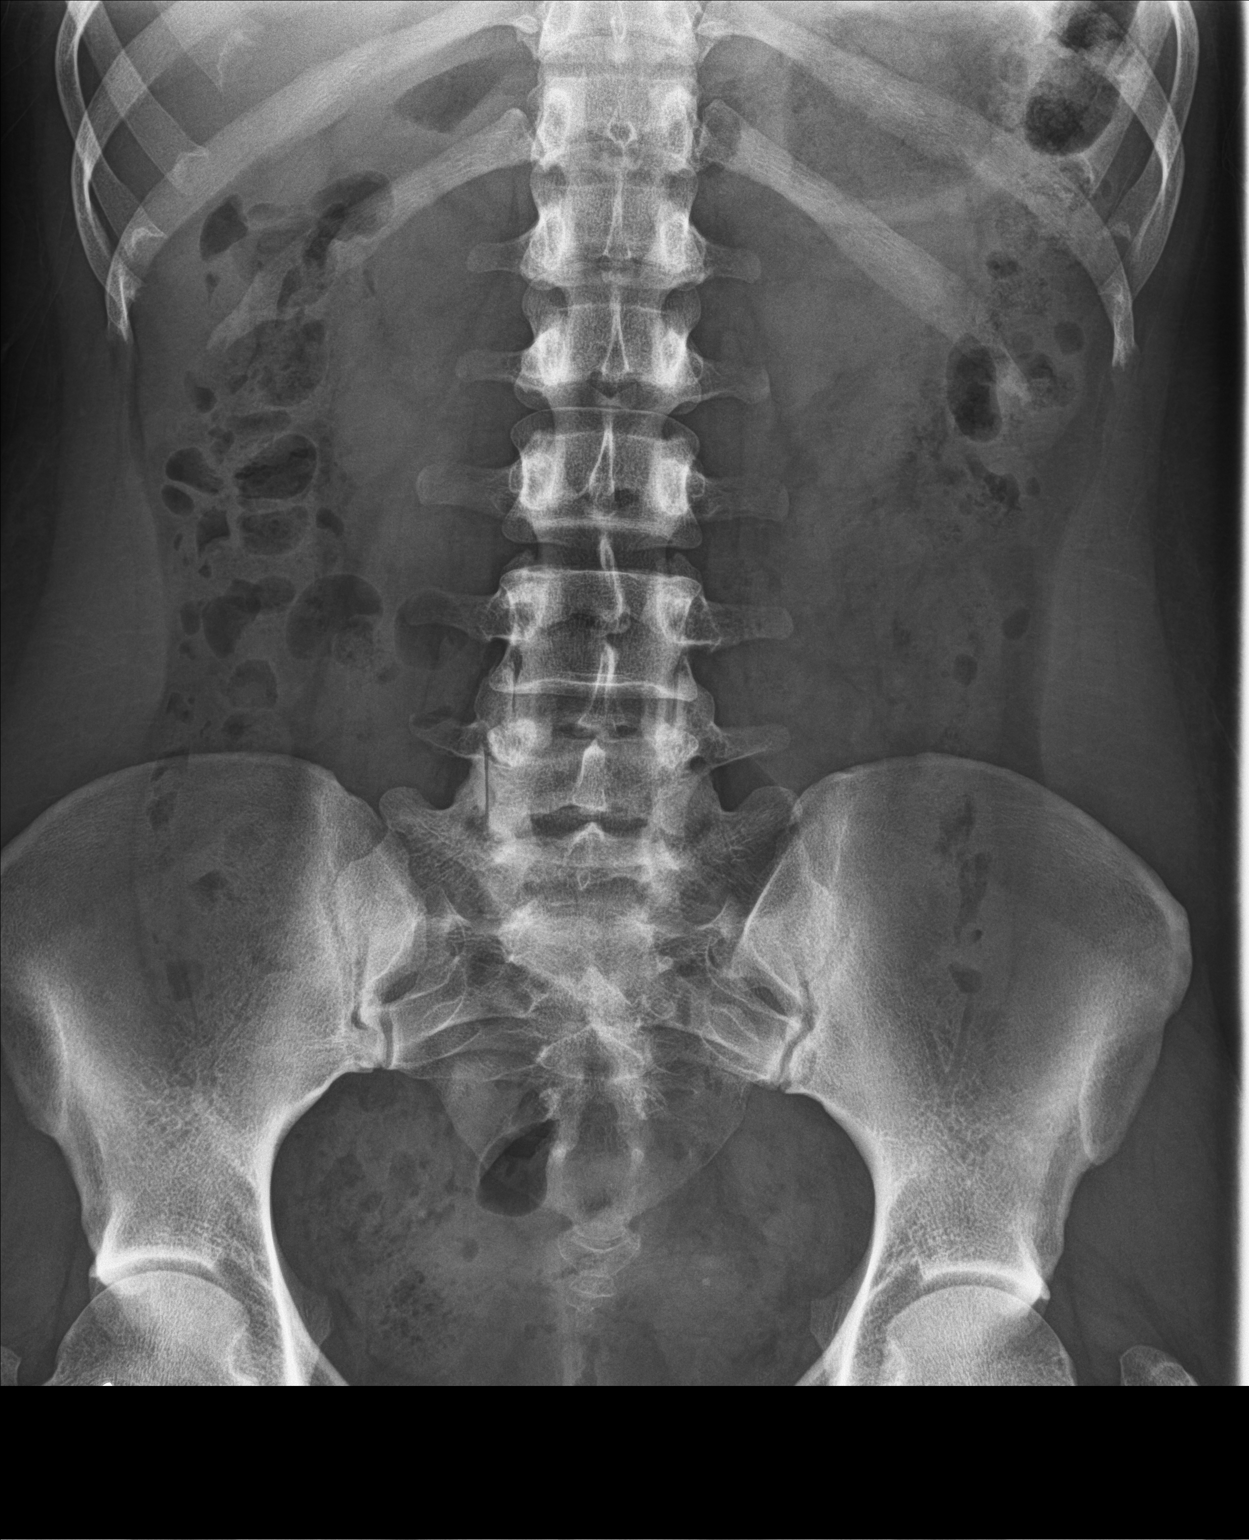

[abdomen kub (2 of 2)]
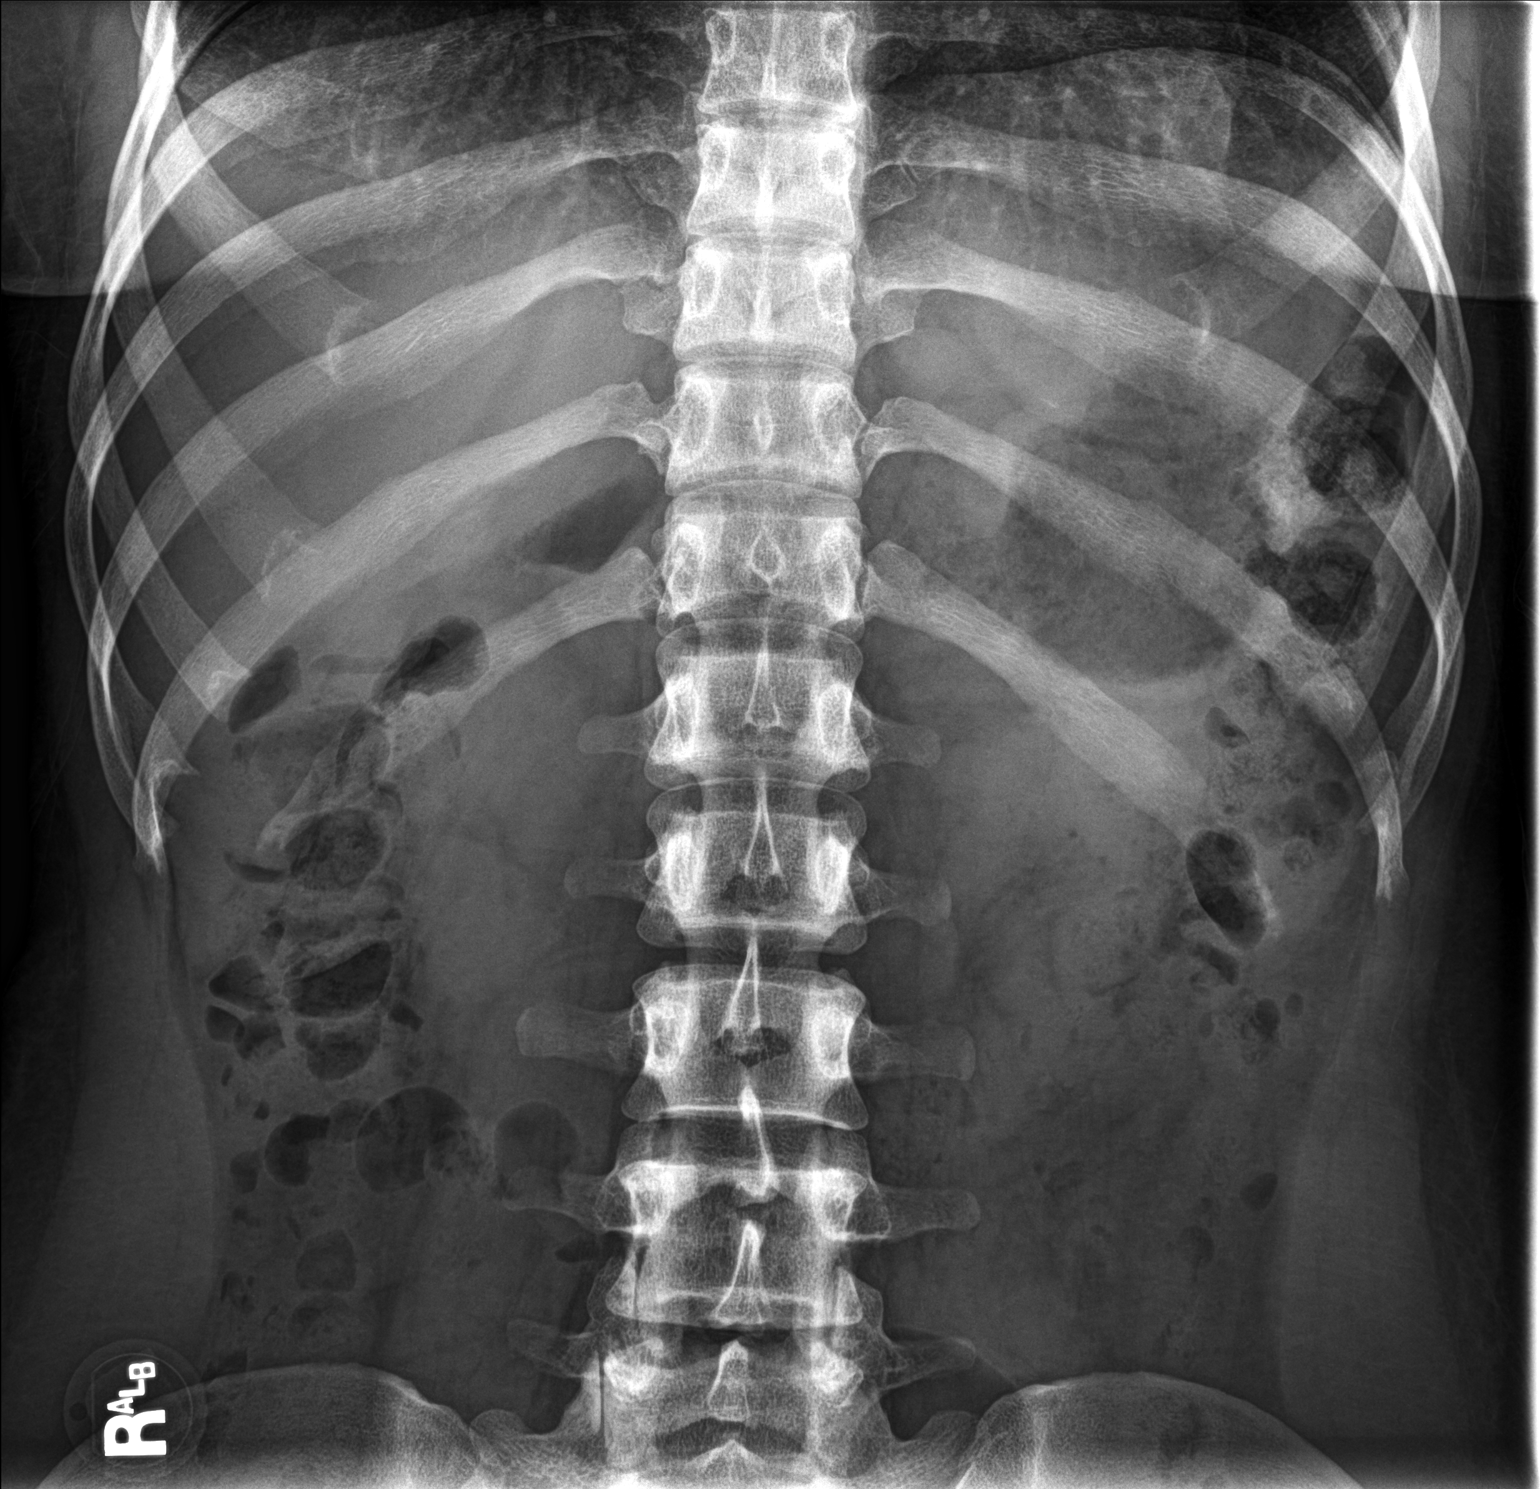

[2 of 2 positions shown; findings below may reference images not displayed]

FINDINGS: Scattered gas and a small to moderate amount of stool are present
throughout the colon. No dilated loops of bowel are seen to suggest
obstruction. Punctate calcifications in the pelvis are compatible
with phleboliths. No acute osseous abnormality is seen.
IMPRESSION: Negative.

## 2020-07-12 MED ORDER — CYCLOBENZAPRINE HCL 10 MG PO TABS: 10.0000 mg | ORAL_TABLET | Freq: Three times a day (TID) | ORAL | 0 refills | Status: AC | PRN

## 2020-07-12 MED ORDER — DICLOFENAC SODIUM 75 MG PO TBEC: 75.0000 mg | DELAYED_RELEASE_TABLET | Freq: Two times a day (BID) | ORAL | 0 refills | Status: AC

## 2020-07-12 MED ORDER — ONDANSETRON HCL 4 MG PO TABS: 4.0000 mg | ORAL_TABLET | Freq: Three times a day (TID) | ORAL | 0 refills | Status: AC | PRN

## 2020-07-12 NOTE — ED Provider Notes (Signed)
[facility]    CSN: [id number] Arrival date & time: 07/12/20  1131      History   Chief Complaint Chief Complaint  Patient presents with  . Back Pain  . Nausea    HPI Elizabeth Williamson is a 30 y.o. female presenting for midline aching back pain that is constant x2 weeks and worsening over the past 5 days.  Patient denies any injury.  She says she does workout regularly but denies any heavy weight lifting.  She has continued to work out.  She says nothing makes the pain worse.  No radiation of pain down the legs.  No associated numbness, weakness or tingling.  Denies any increased pain with rotation, flexion or extension of the back.  No increased pain with sitting, standing or walking.  Has taken ibuprofen, Tylenol and use a heating pad all with minimal improvement in the pain.  Admits to some associated nausea and fatigue as well.  Patient does have Nexplanon and says that her periods are generally irregular she has had some spotting off and on since having it placed in January 2022.  Denies any vaginal discharge, dysuria, urinary frequency urgency.  Denies similar problem in the past.  No history of kidney stones.  Patient does have a history of constipation but states she has been growing normally with the last bowel movement yesterday.  She has had multiple abdominal surgeries for hernias.  No other concerns.  HPI  Past Medical History:  Diagnosis Date  . Alopecia    got injections through dermatologist  . Migraine    rare    Patient Active Problem List   Diagnosis Date Noted  . Constipation 07/31/2019  . Diastasis recti 07/31/2019  . Term pregnancy 12/15/2016  . SVD (spontaneous vaginal delivery) 12/15/2016  . Postpartum care following vaginal delivery 12/15/2016  . NSVD (normal spontaneous vaginal delivery) 10/14/2015  . Active labor 10/11/2015  . Group B Streptococcus carrier, antepartum 09/17/2015  . Asthma, mild intermittent 05/06/2015  . Allergic rhinitis  05/06/2015  . Supervision of normal first pregnancy 05/06/2015  . Gastroesophageal reflux disease without esophagitis 11/09/2013    Past Surgical History:  Procedure Laterality Date  . NO PAST SURGERIES      OB History    Gravida  3   Para  2   Term  2   Preterm      AB  1   Living  2     SAB  1   IAB  0   Ectopic      Multiple  0   Live Births  2            Home Medications    Prior to Admission medications   Medication Sig Start Date End Date Taking? Authorizing Provider  albuterol (PROAIR HFA) 108 (90 Base) MCG/ACT inhaler Inhale 1-2 puffs into the lungs every 4 (four) hours as needed. 10/27/18  Yes Cheron Schaumann K, PA-C  cyclobenzaprine (FLEXERIL) 10 MG tablet Take 1 tablet (10 mg total) by mouth 3 (three) times daily as needed for up to 7 days for muscle spasms. 07/12/20 07/19/20 Yes Shirlee Latch, PA-C  diclofenac (VOLTAREN) 75 MG EC tablet Take 1 tablet (75 mg total) by mouth 2 (two) times daily for 15 days. 07/12/20 07/27/20 Yes Shirlee Latch, PA-C  etonogestrel (NEXPLANON) 68 MG IMPL implant 1 each by Subdermal route once.   Yes [provider]  fluticasone (FLONASE) 50 MCG/ACT nasal spray Place 2 sprays  into both nostrils daily. 06/19/19  Yes Doristine Bosworth, MD  linaclotide (LINZESS) 72 MCG capsule Take 1 capsule (72 mcg total) by mouth daily before breakfast. 10/13/19  Yes Midge Minium, MD  ondansetron (ZOFRAN) 4 MG tablet Take 1 tablet (4 mg total) by mouth every 8 (eight) hours as needed for up to 5 days for nausea or vomiting. 07/12/20 07/17/20 Yes Shirlee Latch, PA-C  Lactulose 20 GM/30ML SOLN Take 30 mLs (20 g total) by mouth 2 (two) times daily as needed. Patient not taking: Reported on 09/23/2019 08/21/19   Verlee Monte, NP  Multiple Vitamin (MULTIVITAMIN WITH MINERALS) TABS tablet Take 1 tablet by mouth daily.    [provider]  PREBIOTIC PRODUCT PO Take by mouth. Patient not taking: Reported on 12/18/2019    [provider]   Probiotic Product (PROBIOTIC-10 ULTIMATE PO) Take by mouth. Patient not taking: Reported on 12/18/2019    [provider]    Family History Family History  Problem Relation Age of Onset  . Heart disease Brother        recently found to have enlarged heart (age 55)  . Diabetes Maternal Grandmother   . Cancer Maternal Grandfather        colon cancer (60's)  . Hypertension Paternal Grandmother   . Cancer Paternal Uncle        lung cancer (nonsmoker)    Social History Social History   Tobacco Use  . Smoking status: Former Smoker    Packs/day: 0.50    Years: 3.00    Pack years: 1.50    Types: Cigarettes    Quit date: 08/14/2013    Years since quitting: 6.9  . Smokeless tobacco: Never Used  . Tobacco comment: increased to 3 pack/week  Vaping Use  . Vaping Use: Never used  Substance Use Topics  . Alcohol use: Yes    Alcohol/week: 0.0 standard drinks    Comment: Occasional social drinker, every other weekend  . Drug use: No     Allergies   Patient has no known allergies.   Review of Systems Review of Systems  Constitutional: Positive for fatigue. Negative for chills and fever.  Gastrointestinal: Positive for nausea. Negative for abdominal pain, diarrhea and vomiting.  Genitourinary: Negative for decreased urine volume, dysuria, flank pain, frequency, hematuria, pelvic pain, urgency, vaginal bleeding, vaginal discharge and vaginal pain.  Musculoskeletal: Positive for back pain.  Skin: Negative for rash.  Neurological: Negative for dizziness, weakness and headaches.     Physical Exam Triage Vital Signs ED Triage Vitals  Enc Vitals Group     BP 07/12/20 1151 117/81     Pulse Rate 07/12/20 1151 85     Resp 07/12/20 1151 18     Temp 07/12/20 1151 98.3 F (36.8 C)     Temp Source 07/12/20 1151 Oral     SpO2 07/12/20 1151 99 %     Weight 07/12/20 1149 167 lb 15.9 oz (76.2 kg)     Height 07/12/20 1149 5\' 6"  (1.676 m)     Head Circumference --      Peak Flow  --      Pain Score 07/12/20 1148 6     Pain Loc --      Pain Edu? --      Excl. in GC? --    No data found.  Updated Vital Signs BP 117/81 (BP Location: Left Arm)   Pulse 85   Temp 98.3 F (36.8 C) (Oral)  Resp 18   Ht 5\' 6"  (1.676 m)   Wt 167 lb 15.9 oz (76.2 kg)   SpO2 99%   BMI 27.11 kg/m       Physical Exam Vitals and nursing note reviewed.  Constitutional:      General: She is not in acute distress.    Appearance: Normal appearance. She is not ill-appearing or toxic-appearing.  HENT:     Head: Normocephalic and atraumatic.  Eyes:     General: No scleral icterus.       Right eye: No discharge.        Left eye: No discharge.     Conjunctiva/sclera: Conjunctivae normal.  Cardiovascular:     Rate and Rhythm: Normal rate and regular rhythm.     Heart sounds: Normal heart sounds.  Pulmonary:     Effort: Pulmonary effort is normal. No respiratory distress.     Breath sounds: Normal breath sounds.  Abdominal:     Palpations: Abdomen is soft.     Tenderness: There is no abdominal tenderness. There is no right CVA tenderness or left CVA tenderness.  Musculoskeletal:     Cervical back: Neck supple.     Lumbar back: Normal. No tenderness or bony tenderness. Normal range of motion. Negative right straight leg raise test and negative left straight leg raise test.  Skin:    General: Skin is dry.  Neurological:     General: No focal deficit present.     Mental Status: She is alert. Mental status is at baseline.     Motor: No weakness.     Gait: Gait normal.  Psychiatric:        Mood and Affect: Mood normal.        Behavior: Behavior normal.        Thought Content: Thought content normal.      UC Treatments / Results  Labs (all labs ordered are listed, but only abnormal results are displayed) Labs Reviewed  URINALYSIS, COMPLETE (UACMP) WITH MICROSCOPIC - Abnormal; Notable for the following components:      Result Value   Hgb urine dipstick MODERATE (*)     Bacteria, UA FEW (*)    All other components within normal limits  CBC WITH DIFFERENTIAL/PLATELET - Abnormal; Notable for the following components:   Neutro Abs 1.4 (*)    All other components within normal limits  PREGNANCY, URINE  BASIC METABOLIC PANEL    EKG   Radiology DG Abdomen 1 View  Result Date: 07/12/2020 CLINICAL DATA:  Low back pain for 2 weeks.  Nausea. EXAM: ABDOMEN - 1 VIEW COMPARISON:  CT abdomen and pelvis 08/21/2019 FINDINGS: Scattered gas and a small to moderate amount of stool are present throughout the colon. No dilated loops of bowel are seen to suggest obstruction. Punctate calcifications in the pelvis are compatible with phleboliths. No acute osseous abnormality is seen. IMPRESSION: Negative. Electronically Signed   By: 10/21/2019 M.D.   On: 07/12/2020 13:10    Procedures Procedures (including critical care time)  Medications Ordered in UC Medications - No data to display  Initial Impression / Assessment and Plan / UC Course  I have reviewed the triage vital signs and the nursing notes.  Pertinent labs & imaging results that were available during my care of the patient were reviewed by me and considered in my medical decision making (see chart for details).   30 year old female presenting for 2-week history of lower back pain with associated nausea and fatigue.  Vital  signs are all normal and stable.  She is overall well-appearing.  Exam is within normal limits.  Unable to reproduce any pain and she has good range of motion of her back.  No abdominal tenderness.  UA obtained today shows moderate blood.  Patient states that she is having some vaginal spotting.  CBC and BMP are within normal limits. Pregnancy test negative.  KUB obtained today to assess for possible renal stone, constipation or other contributing cause for her back pain.  Independently interpreted imaging today. X-rays within normal limits.  Reviewed this with patient.  Suspect  musculoskeletal etiology.  Supportive and conservative care at this time with diclofenac sodium and cyclobenzaprine.  Also advised she can take Tylenol.  Discussed stretches.  I did send Zofran for the nausea.  She may be having nausea because of the pain.  Reviewed ED red flag signs and symptoms for back pain.  Advised to follow-up with PCP or [facility] if still having the back pain in 2 weeks.   Final Clinical Impressions(s) / UC Diagnoses   Final diagnoses:  Acute midline low back pain without sciatica  Nausea     Discharge Instructions     BACK PAIN: Stressed avoiding painful activities . RICE (REST, ICE, COMPRESSION, ELEVATION) guidelines reviewed. May alternate ice and heat. Consider use of muscle rubs, Salonpas patches, etc. Use medications as directed including muscle relaxers if prescribed. Take anti-inflammatory medications as prescribed or OTC NSAIDs/Tylenol.  F/u with PCP in 7-10 days for reexamination, and please feel free to call or return to the urgent care at any time for any questions or concerns you may have and we will be happy to help you!   BACK PAIN RED FLAGS: If the back pain acutely worsens or there are any red flag symptoms such as numbness/tingling, leg weakness, saddle anesthesia, or loss of bowel/bladder control, go immediately to the ER. Follow up with us as scheduled or sooner if the pain does not begin to resolve or if it worsens before the follow up      ED Prescriptions    Medication Sig Dispense Auth. Provider   diclofenac (VOLTAREN) 75 MG EC tablet Take 1 tablet (75 mg total) by mouth 2 (two) times daily for 15 days. 30 tablet Eusebio FriendlyEaves, Valerian Jewel B, PA-C   ondansetron (ZOFRAN) 4 MG tablet Take 1 tablet (4 mg total) by mouth every 8 (eight) hours as needed for up to 5 days for nausea or vomiting. 12 tablet Eusebio FriendlyEaves, Pat Elicker B, PA-C   cyclobenzaprine (FLEXERIL) 10 MG tablet Take 1 tablet (10 mg total) by mouth 3 (three) times daily as needed for up to 7 days for muscle  spasms. 20 tablet Gareth MorganEaves, Nial Hawe B, PA-C     PDMP not reviewed this encounter.   Shirlee Latchaves, Derl Abalos B, PA-C 07/12/20 1327

## 2020-07-12 NOTE — Discharge Instructions (Signed)

## 2020-07-12 NOTE — ED Triage Notes (Signed)
Pt c/o lower back pain, and nausea. Started about 2 weeks ago. Denies dysuria or vaginal discharge.

## 2021-06-24 ENCOUNTER — Ambulatory Visit
Admission: EM | Admit: 2021-06-24 | Discharge: 2021-06-24 | Disposition: A | Discharge status: Home / Self Care | Payer: BC Managed Care – PPO | Attending: Internal Medicine | Admitting: Internal Medicine

## 2021-06-24 ENCOUNTER — Other Ambulatory Visit: Payer: Self-pay

## 2021-06-24 DIAGNOSIS — J020 Streptococcal pharyngitis: Secondary | ICD-10-CM

## 2021-06-24 LAB — POCT RAPID STREP A (OFFICE): Rapid Strep A Screen: POSITIVE — AB

## 2021-06-24 MED ORDER — AMOXICILLIN 500 MG PO CAPS: 500.0000 mg | ORAL_CAPSULE | Freq: Two times a day (BID) | ORAL | 0 refills | Status: AC

## 2021-06-24 NOTE — ED Triage Notes (Signed)
Patient presents to Urgent Care with complaints of intermittent headache, fatigue, and sore throat since weds. Treating symptoms with ibuprofen.  ? ?Denies fever.  ?

## 2021-06-24 NOTE — ED Provider Notes (Signed)
?[name] ? ? ? ?CSN: [id number] ?Arrival date & time: 06/24/21  [id number] ? ? ?  ? ?History   ?Chief Complaint ?Chief Complaint  ?Patient presents with  ? Sore Throat  ? ? ?HPI ?Elizabeth Williamson is a 31 y.o. female.  ? ?Patient presents with 4-day history of headache, fatigue, sore throat.  Patient reports that her daughter has hand, foot, mouth disease.  Denies any known fevers.  Denies any associated upper respiratory symptoms or cough.  Denies chest pain, shortness of breath, nausea, vomiting, diarrhea, abdominal pain.  Patient has been taking ibuprofen. Patient denies rash.  ? ? ?Sore Throat ? ? ?Past Medical History:  ?Diagnosis Date  ? Alopecia   ? got injections through dermatologist  ? Migraine   ? rare  ? ? ?Patient Active Problem List  ? Diagnosis Date Noted  ? Constipation 07/31/2019  ? Diastasis recti 07/31/2019  ? Term pregnancy 12/15/2016  ? SVD (spontaneous vaginal delivery) 12/15/2016  ? Postpartum care following vaginal delivery 12/15/2016  ? NSVD (normal spontaneous vaginal delivery) 10/14/2015  ? Active labor 10/11/2015  ? Group B Streptococcus carrier, antepartum 09/17/2015  ? Asthma, mild intermittent 05/06/2015  ? Allergic rhinitis 05/06/2015  ? Supervision of normal first pregnancy 05/06/2015  ? Gastroesophageal reflux disease without esophagitis 11/09/2013  ? ? ?Past Surgical History:  ?Procedure Laterality Date  ? NO PAST SURGERIES    ? ? ?OB History   ? ? Gravida  ?3  ? Para  ?2  ? Term  ?2  ? Preterm  ?   ? AB  ?1  ? Living  ?2  ?  ? ? SAB  ?1  ? IAB  ?0  ? Ectopic  ?   ? Multiple  ?0  ? Live Births  ?2  ?   ?  ?  ? ? ? ?Home Medications   ? ?Prior to Admission medications   ?Medication Sig Start Date End Date Taking? Authorizing Provider  ?amoxicillin (AMOXIL) 500 MG capsule Take 1 capsule (500 mg total) by mouth 2 (two) times daily for 10 days. 06/24/21 07/04/21 Yes Anzal Bartnick, Acie FredricksonHaley E, FNP  ?albuterol (PROAIR HFA) 108 (90 Base) MCG/ACT inhaler Inhale 1-2 puffs into the lungs every 4  (four) hours as needed. 10/27/18   Elson AreasSofia, Leslie K, PA-C  ?etonogestrel (NEXPLANON) 68 MG IMPL implant 1 each by Subdermal route once.    [provider]  ?fluticasone (FLONASE) 50 MCG/ACT nasal spray Place 2 sprays into both nostrils daily. 06/19/19   Doristine BosworthStallings, Zoe A, MD  ?Lactulose 20 GM/30ML SOLN Take 30 mLs (20 g total) by mouth 2 (two) times daily as needed. ?Patient not taking: Reported on 09/23/2019 08/21/19   Verlee MonteGray, Bryan E, NP  ?linaclotide Karlene Einstein(LINZESS) 72 MCG capsule Take 1 capsule (72 mcg total) by mouth daily before breakfast. 10/13/19   Midge MiniumWohl, Darren, MD  ?Multiple Vitamin (MULTIVITAMIN WITH MINERALS) TABS tablet Take 1 tablet by mouth daily.    [provider]  ?PREBIOTIC PRODUCT PO Take by mouth. ?Patient not taking: Reported on 12/18/2019    [provider]  ?Probiotic Product (PROBIOTIC-10 ULTIMATE PO) Take by mouth. ?Patient not taking: Reported on 12/18/2019    [provider]  ? ? ?Family History ?Family History  ?Problem Relation Age of Onset  ? Heart disease Brother   ?     recently found to have enlarged heart (age 33)  ? Diabetes Maternal Grandmother   ? Cancer Maternal Grandfather   ?  colon cancer (60's)  ? Hypertension Paternal Grandmother   ? Cancer Paternal Uncle   ?     lung cancer (nonsmoker)  ? ? ?Social History ?Social History  ? ?Tobacco Use  ? Smoking status: Former  ?  Packs/day: 0.50  ?  Years: 3.00  ?  Pack years: 1.50  ?  Types: Cigarettes  ?  Quit date: 08/14/2013  ?  Years since quitting: 7.8  ? Smokeless tobacco: Never  ? Tobacco comments:  ?  increased to 3 pack/week  ?Vaping Use  ? Vaping Use: Never used  ?Substance Use Topics  ? Alcohol use: Yes  ?  Alcohol/week: 0.0 standard drinks  ?  Comment: Occasional social drinker, every other weekend  ? Drug use: No  ? ? ? ?Allergies   ?Patient has no known allergies. ? ? ?Review of Systems ?Review of Systems ?Per HPI ? ?Physical Exam ?Triage Vital Signs ?ED Triage Vitals [06/24/21 0848]  ?Enc Vitals Group  ?    BP 104/65  ?   Pulse Rate 83  ?   Resp 16  ?   Temp 98.5 ?F (36.9 ?C)  ?   Temp Source Oral  ?   SpO2 97 %  ?   Weight   ?   Height   ?   Head Circumference   ?   Peak Flow   ?   Pain Score   ?   Pain Loc   ?   Pain Edu?   ?   Excl. in GC?   ? ?No data found. ? ?Updated Vital Signs ?BP 104/65 (BP Location: Right Arm)   Pulse 83   Temp 98.5 ?F (36.9 ?C) (Oral)   Resp 16   SpO2 97%  ? ?Visual Acuity ?Right Eye Distance:   ?Left Eye Distance:   ?Bilateral Distance:   ? ?Right Eye Near:   ?Left Eye Near:    ?Bilateral Near:    ? ?Physical Exam ?Constitutional:   ?   General: She is not in acute distress. ?   Appearance: Normal appearance. She is not toxic-appearing or diaphoretic.  ?HENT:  ?   Head: Normocephalic and atraumatic.  ?   Right Ear: Tympanic membrane and ear canal normal.  ?   Left Ear: Tympanic membrane and ear canal normal.  ?   Nose: Nose normal. No congestion.  ?   Mouth/Throat:  ?   Mouth: Mucous membranes are moist.  ?   Pharynx: Posterior oropharyngeal erythema present.  ?Eyes:  ?   Extraocular Movements: Extraocular movements intact.  ?   Conjunctiva/sclera: Conjunctivae normal.  ?   Pupils: Pupils are equal, round, and reactive to light.  ?Cardiovascular:  ?   Rate and Rhythm: Normal rate and regular rhythm.  ?   Pulses: Normal pulses.  ?   Heart sounds: Normal heart sounds.  ?Pulmonary:  ?   Effort: Pulmonary effort is normal. No respiratory distress.  ?   Breath sounds: Normal breath sounds. No wheezing.  ?Abdominal:  ?   General: Abdomen is flat. Bowel sounds are normal.  ?   Palpations: Abdomen is soft.  ?Musculoskeletal:     ?   General: Normal range of motion.  ?   Cervical back: Normal range of motion.  ?Skin: ?   General: Skin is warm and dry.  ?Neurological:  ?   General: No focal deficit present.  ?   Mental Status: She is alert and oriented to person, place, and time. Mental  status is at baseline.  ?Psychiatric:     ?   Mood and Affect: Mood normal.     ?   Behavior: Behavior normal.   ? ? ? ?UC Treatments / Results  ?Labs ?(all labs ordered are listed, but only abnormal results are displayed) ?Labs Reviewed  ?POCT RAPID STREP A (OFFICE) - Abnormal; Notable for the following components:  ?    Result Value  ? Rapid Strep A Screen Positive (*)   ? All other components within normal limits  ? ? ?EKG ? ? ?Radiology ?No results found. ? ?Procedures ?Procedures (including critical care time) ? ?Medications Ordered in UC ?Medications - No data to display ? ?Initial Impression / Assessment and Plan / UC Course  ?I have reviewed the triage vital signs and the nursing notes. ? ?Pertinent labs & imaging results that were available during my care of the patient were reviewed by me and considered in my medical decision making (see chart for details). ? ?  ? ?Rapid strep test was positive.  Will treat with amoxicillin antibiotic.  Discussed supportive care and symptom management with patient.  No signs of peritonsillar abscess on exam.  Discussed return precautions.  Patient verbalized understanding and was agreeable with plan. ?Final Clinical Impressions(s) / UC Diagnoses  ? ?Final diagnoses:  ?Strep pharyngitis  ? ? ? ?Discharge Instructions   ? ?  ?You have strep throat which is being treated with an antibiotic.  Please follow-up if symptoms persist or worsen. ? ? ? ? ?ED Prescriptions   ? ? Medication Sig Dispense Auth. Provider  ? amoxicillin (AMOXIL) 500 MG capsule Take 1 capsule (500 mg total) by mouth 2 (two) times daily for 10 days. 20 capsule Ervin Knack E, [place]  ? ?  ? ?PDMP not reviewed this encounter. ?  ?Gustavus Bryant, [place] ?06/24/21 7867 ? ?

## 2021-06-24 NOTE — Discharge Instructions (Signed)
You have strep throat which is being treated with an antibiotic.  Please follow-up if symptoms persist or worsen. ?

## 2021-08-07 ENCOUNTER — Emergency Department (HOSPITAL_COMMUNITY): Payer: BC Managed Care – PPO

## 2021-08-07 ENCOUNTER — Other Ambulatory Visit: Payer: Self-pay

## 2021-08-07 ENCOUNTER — Emergency Department (HOSPITAL_COMMUNITY)
Admission: EM | Admit: 2021-08-07 | Discharge: 2021-08-07 | Disposition: A | Discharge status: Home / Self Care | Payer: BC Managed Care – PPO | Attending: Emergency Medicine | Admitting: Emergency Medicine

## 2021-08-07 DIAGNOSIS — R202 Paresthesia of skin: Secondary | ICD-10-CM | POA: Insufficient documentation

## 2021-08-07 DIAGNOSIS — R200 Anesthesia of skin: Secondary | ICD-10-CM | POA: Diagnosis present

## 2021-08-07 LAB — CBC
HCT: 37.8 % (ref 36.0–46.0)
Hemoglobin: 12.2 g/dL (ref 12.0–15.0)
MCH: 28.4 pg (ref 26.0–34.0)
MCHC: 32.3 g/dL (ref 30.0–36.0)
MCV: 88.1 fL (ref 80.0–100.0)
Platelets: 239 10*3/uL (ref 150–400)
RBC: 4.29 MIL/uL (ref 3.87–5.11)
RDW: 12.3 % (ref 11.5–15.5)
WBC: 6.5 10*3/uL (ref 4.0–10.5)
nRBC: 0 % (ref 0.0–0.2)

## 2021-08-07 LAB — BASIC METABOLIC PANEL
Anion gap: 7 (ref 5–15)
BUN: 11 mg/dL (ref 6–20)
CO2: 22 mmol/L (ref 22–32)
Calcium: 9.3 mg/dL (ref 8.9–10.3)
Chloride: 109 mmol/L (ref 98–111)
Creatinine, Ser: 0.73 mg/dL (ref 0.44–1.00)
GFR, Estimated: >60 mL/min (ref >60)
Glucose, Bld: 136 mg/dL — ABNORMAL HIGH (ref 70–99)
Potassium: 4.3 mmol/L (ref 3.5–5.1)
Sodium: 138 mmol/L (ref 135–145)

## 2021-08-07 IMAGING — MR MR HEAD WO/W CM
14 of 17 series · 32 of 48 positions shown · IV contrast (gadavist)
Comparison: None.

CLINICAL DATA: Pins and needle sensation to the left side of body
lower back pain for the past year.

Neuro deficit with acute stroke or demyelinating disease suspected.
EXAM:
MRI HEAD WITHOUT AND WITH CONTRAST
MRI CERVICAL SPINE WITHOUT AND WITH CONTRAST
TECHNIQUE: Multiplanar, multiecho pulse sequences of the brain and surrounding
structures, and cervical spine, to include the craniocervical
junction and cervicothoracic junction, were obtained without and
with intravenous contrast.
CONTRAST:  7.5mL GADAVIST GADOBUTROL 1 MMOL/ML IV SOLN

[Series 5: DWI · axial · 3.0mm · 0.88mm/px · z∈[-71,+74]mm · 5 of 100 slices shown (1 of 4)]
[im 1/100]
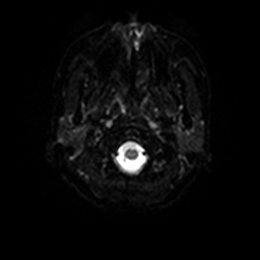
[im 25/100]
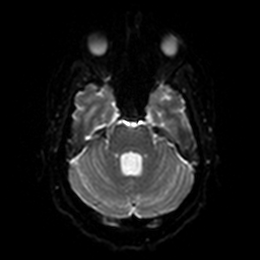
[im 50/100]
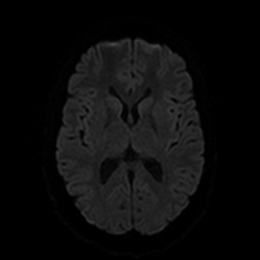
[im 75/100]
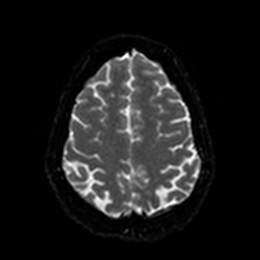
[im 100/100]
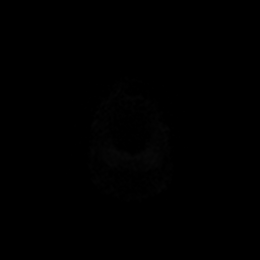

[Series 6: DWI · axial · 3.0mm · 0.88mm/px · z∈[-71,+74]mm · 2 of 50 slices shown (2 of 4)]
[im 1/50]
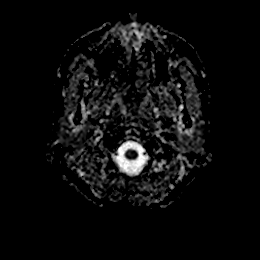
[im 50/50]
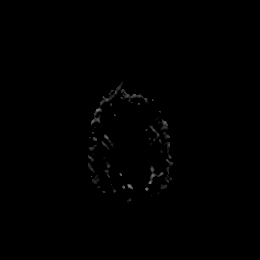

[Series 7: DWI · coronal · 4.0mm · 0.88mm/px · 3 of 64 slices shown (3 of 4)]
[im 1/64]
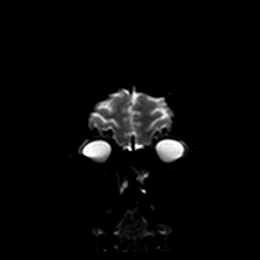
[im 32/64]
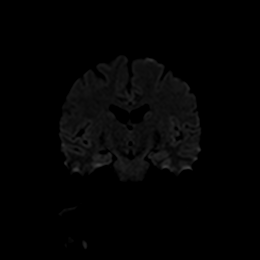
[im 64/64]
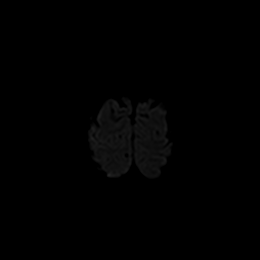

[Series 8: DWI · coronal · 4.0mm · 0.88mm/px · 1 of 32 slices shown (4 of 4)]
[im 1/32]
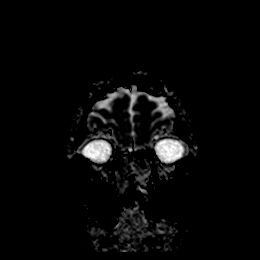

[Series 9: T1 · sagittal · 5.0mm · 0.78mm/px · 1 of 23 slices shown]
[im 1/23]
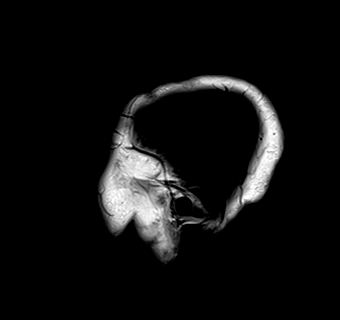

[Series 10: T2 · axial · 5.0mm · 0.72mm/px · 1 of 27 slices shown (1 of 2)]
[im 1/27]
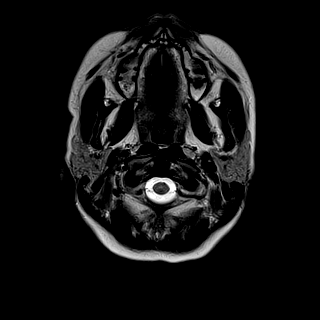

[Series 11: FLAIR · axial · 5.0mm · 0.45mm/px · 1 of 27 slices shown]
[im 1/27]
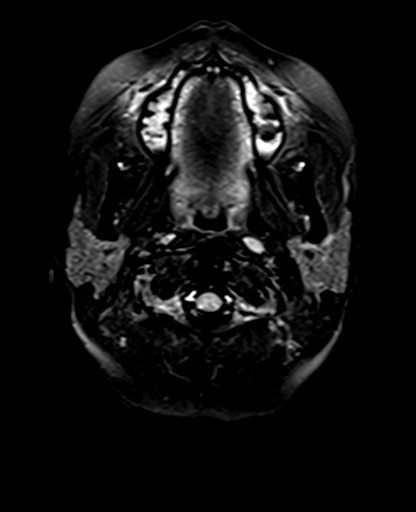

[Series 13: pha_images · axial · 3.0mm · 0.90mm/px · z∈[-85,+89]mm · 3 of 60 slices shown]
[im 1/60]
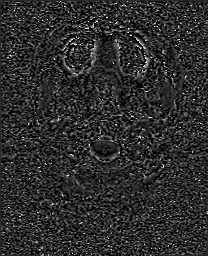
[im 30/60]
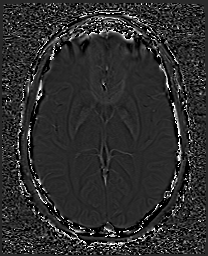
[im 60/60]
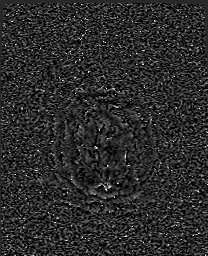

[Series 14: swi_images · axial · 3.0mm · 0.90mm/px · z∈[-85,+89]mm · 3 of 60 slices shown]
[im 1/60]
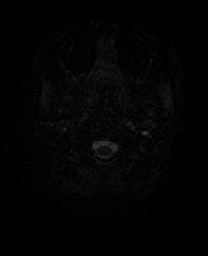
[im 30/60]
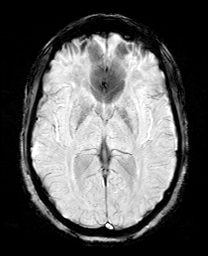
[im 60/60]
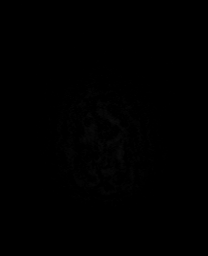

[Series 16: t2_space_dark-fluid_sag_p2_ns-ir · sagittal · 1.0mm · 0.49mm/px · 8 of 192 slices shown]
[im 1/192]
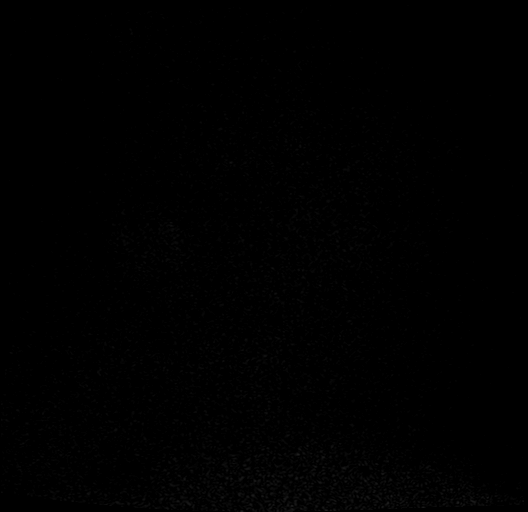
[im 24/192]
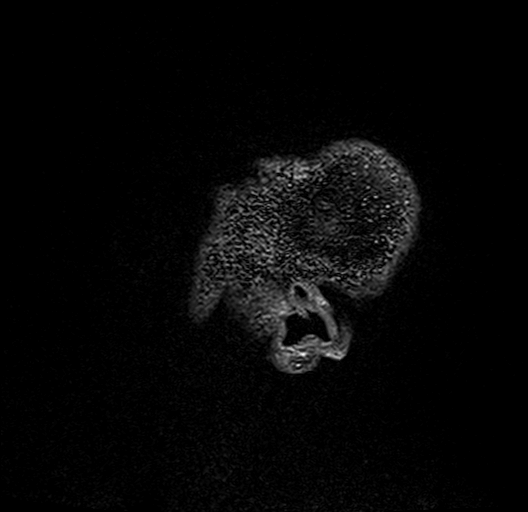
[im 48/192]
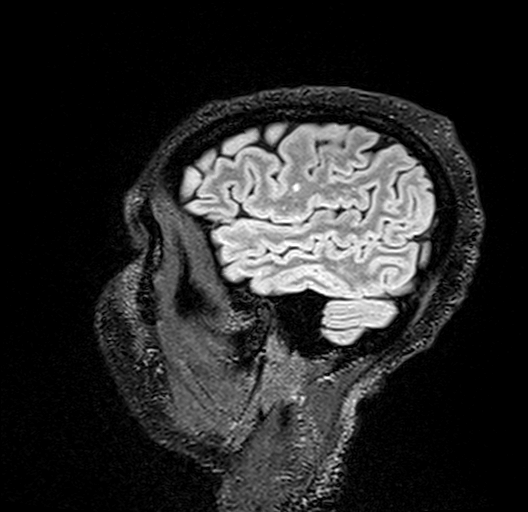
[im 72/192]
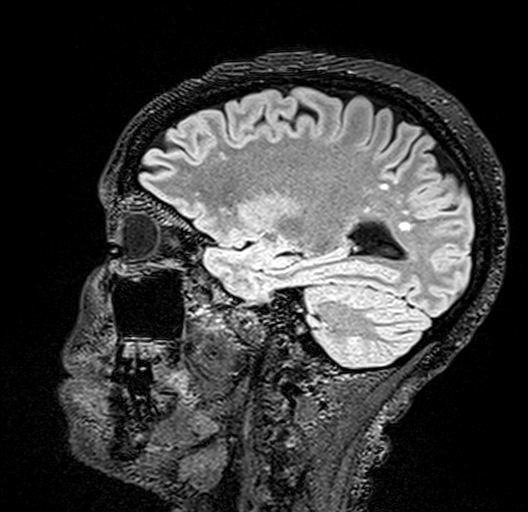
[im 120/192]
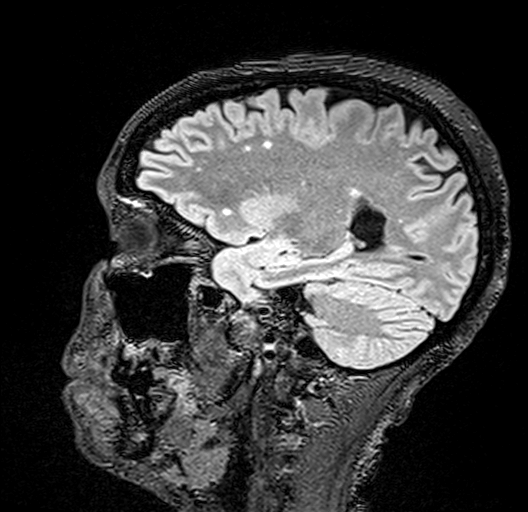
[im 144/192]
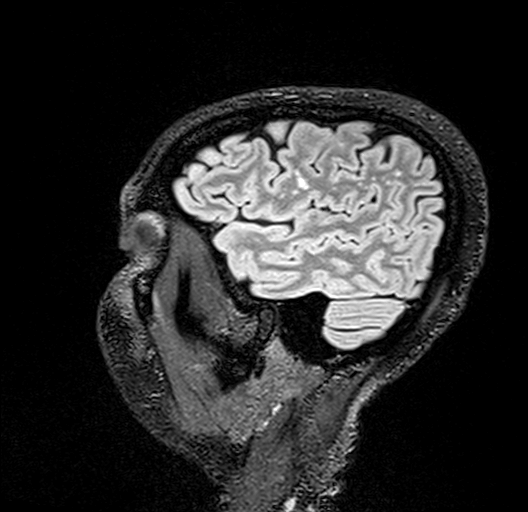
[im 168/192]
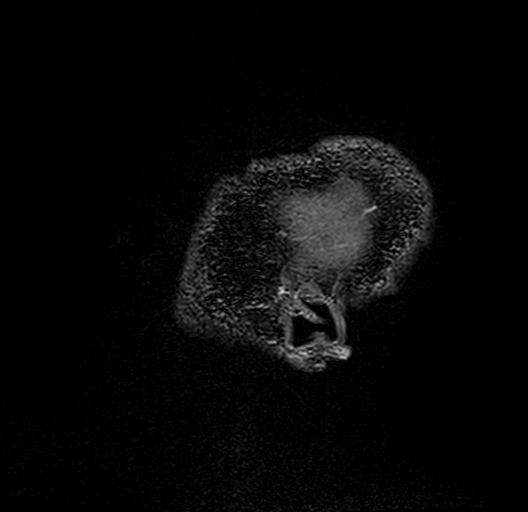
[im 192/192]
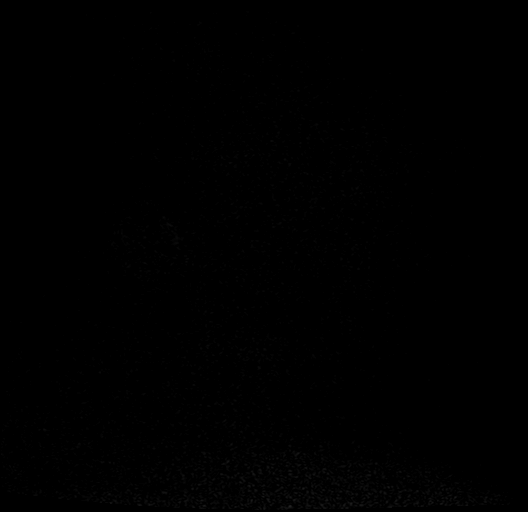

[Series 17: t2_space_dark-fluid_sag_p2_ns-ir_mpr_ axial · axial · 1.0mm · 0.45mm/px · 1 of 120 slices shown]
[im 1/120]
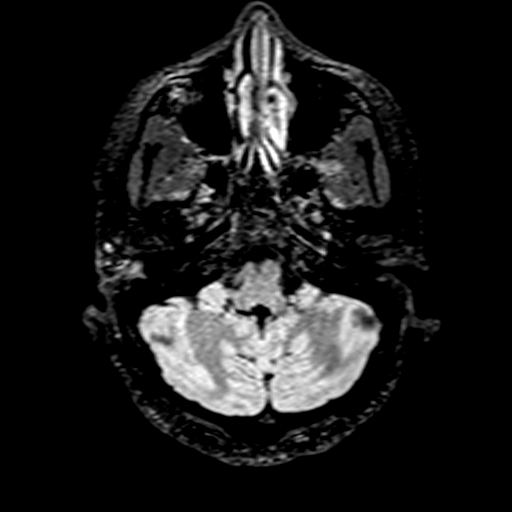

[Series 20: T2 · coronal · 5.0mm · 0.34mm/px · 1 of 29 slices shown (2 of 2)]
[im 1/29]
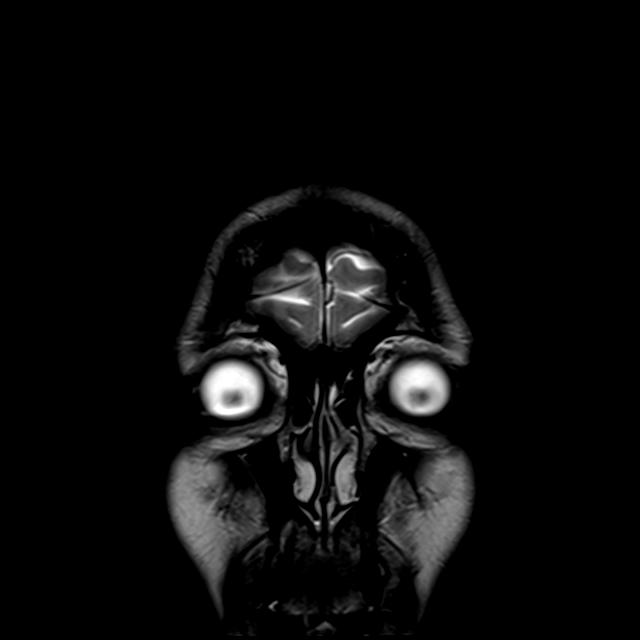

[Series 26: T1 post-contrast · coronal · 5.0mm · 0.34mm/px · 1 of 28 slices shown (1 of 2)]
[im 1/28]
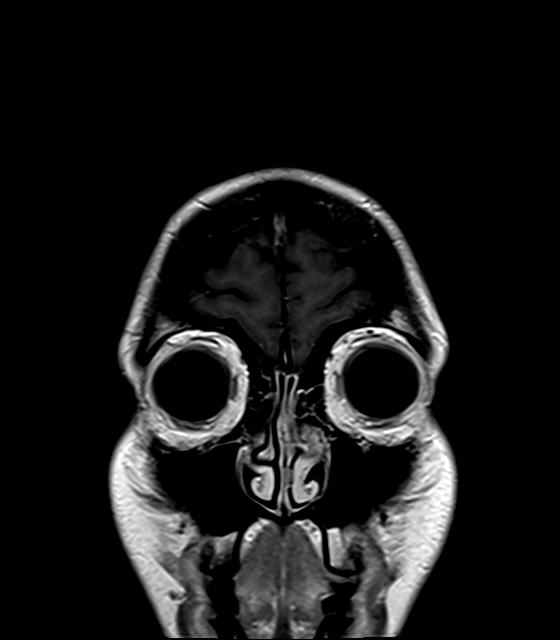

[Series 27: T1 post-contrast · sagittal · 5.0mm · 0.75mm/px · 1 of 23 slices shown (2 of 2)]
[im 1/23]
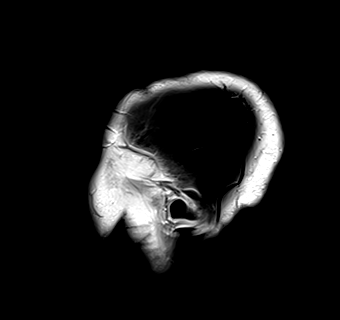

[32 of 48 positions shown; findings below may reference images not displayed]

FINDINGS: MRI HEAD FINDINGS

Brain: Numerous tiny FLAIR hyperintensities scattered in the
cerebral white matter. These are mainly in the peripheral white
matter although not immediately juxtacortical. No periventricular
predilection or detected infratentorial involvement. No restricted
diffusion or abnormal enhancement. Prominent fourth ventricular
volume but no convincing brain atrophy. No evidence of mass,
collection, or hemorrhage.

Vascular: Major flow voids and vascular enhancements are preserved

Skull and upper cervical spine: Normal marrow signal

Sinuses/Orbits: Negative

MRI CERVICAL SPINE FINDINGS

Alignment: Physiologic.

Vertebrae: No fracture, evidence of discitis, or bone lesion.

Cord: Normal signal and morphology.

Posterior Fossa, vertebral arteries, paraspinal tissues: Negative.

Disc levels: No herniation or impingement.
IMPRESSION: Brain MRI:

Numerous small remote white matter insults with nonspecific pattern,
including typical demyelinating pattern. These can be the result of
prior demyelination, infection, CNS inflammation, or migraine. No
evidence of active or recent insult.

Cervical MRI:

Normal appearance of the cord. Widely patent spinal canal and
foramina.

## 2021-08-07 MED ORDER — GADOBUTROL 1 MMOL/ML IV SOLN
7.5000 mL | Freq: Once | INTRAVENOUS | Status: AC | PRN
  Administered 2021-08-07: 7.5 mL via INTRAVENOUS

## 2021-08-07 NOTE — Discharge Instructions (Addendum)
Follow-up with neurology within the week. ? ?You should receive a phone call from them this week.  If they do not call you by midweek, please call them yourself to facilitate your appointment. ? ?Return back to the ER if you have worsening or progressive symptoms or any additional concerns. ?

## 2021-08-07 NOTE — ED Triage Notes (Signed)
Pt states she has had ongoing problems with numbness, "pins and needles" to left side of body and accompanying lower back pain for the past year.  Comes and goes.  States she has been seen in the ED for this as well as by an orthopedic physician and is supposed to be getting an appt with a neurologist.  Tonight she is unable to tolerate this and feels it is more painful.  Expresses concerns as she wishes to have more extensive blood work done as well as an MRI to find the cause of her symptoms.  Currently on steroids as prescribed by ortho as she states they did help her in the past.  ?

## 2021-08-07 NOTE — ED Notes (Signed)
Pt verbalizes understanding of discharge instructions. Opportunity for questions and answers were provided. Pt discharged from the ED.   ?

## 2021-08-07 NOTE — ED Provider Notes (Signed)
Patient signed out to me is pending MRI imaging. ? ?MRI of the brain and C-spine completed.  Findings concerning for multiple demyelinating lesions in the white matter of the brain. ? ?I advised the patient of these findings.  She has an outpatient ambulatory referral to neurology set up.  Symptoms been ongoing for about a year without any significant changes.  Patient has no focal weakness on exam. ? ?We will recommend outpatient follow-up with neurology this week.  Advised immediate return for worsening symptoms new numbness weakness or any additional concerns. ?  ?Cheryll Cockayne, MD ?08/07/21 [phone] ? ?

## 2021-08-07 NOTE — ED Provider Notes (Signed)
?[facility] EMERGENCY DEPARTMENT ?Provider Note ? ? ?CSN: [id number] ?Arrival date & time: 08/07/21  0247 ? ?  ? ?History ? ?Chief Complaint  ?Patient presents with  ? Numbness  ? ? ?Elizabeth Williamson is a 31 y.o. female. ? ?Patient presents to the emergency department with reoccurring episodes of pain, numbness, pins-and-needles on the left side of her body.  This has been ongoing for about a year.  She has seen multiple physicians for this, has never seen a neurologist.  The most recent person she saw was an orthopedic surgeon who put her on prednisone but told her that he did not think it was related to an orthopedic problem.  Patient is frustrated and concerned, wants further testing today to help figure out what is going on.  She has never had associated weakness. ? ? ?  ? ?Home Medications ?Prior to Admission medications   ?Medication Sig Start Date End Date Taking? Authorizing Provider  ?albuterol (PROAIR HFA) 108 (90 Base) MCG/ACT inhaler Inhale 1-2 puffs into the lungs every 4 (four) hours as needed. 10/27/18   Elson Areas, PA-C  ?etonogestrel (NEXPLANON) 68 MG IMPL implant 1 each by Subdermal route once.    [provider]  ?fluticasone (FLONASE) 50 MCG/ACT nasal spray Place 2 sprays into both nostrils daily. 06/19/19   Doristine Bosworth, MD  ?Lactulose 20 GM/30ML SOLN Take 30 mLs (20 g total) by mouth 2 (two) times daily as needed. ?Patient not taking: Reported on 09/23/2019 08/21/19   Verlee Monte, NP  ?linaclotide Karlene Einstein) 72 MCG capsule Take 1 capsule (72 mcg total) by mouth daily before breakfast. 10/13/19   Midge Minium, MD  ?Multiple Vitamin (MULTIVITAMIN WITH MINERALS) TABS tablet Take 1 tablet by mouth daily.    [provider]  ?PREBIOTIC PRODUCT PO Take by mouth. ?Patient not taking: Reported on 12/18/2019    [provider]  ?Probiotic Product (PROBIOTIC-10 ULTIMATE PO) Take by mouth. ?Patient not taking: Reported on 12/18/2019    [provider]  ?    ? ?Allergies    ?Patient has no known allergies.   ? ?Review of Systems   ?Review of Systems  ?Neurological:  Positive for numbness.  ? ?Physical Exam ?Updated Vital Signs ?BP 120/83 (BP Location: Right Arm)   Pulse 75   Temp 98 ?F (36.7 ?C)   Resp 17   SpO2 100%  ?Physical Exam ?Vitals and nursing note reviewed.  ?Constitutional:   ?   General: She is not in acute distress. ?   Appearance: She is well-developed.  ?HENT:  ?   Head: Normocephalic and atraumatic.  ?   Mouth/Throat:  ?   Mouth: Mucous membranes are moist.  ?Eyes:  ?   General: Vision grossly intact. Gaze aligned appropriately.  ?   Extraocular Movements: Extraocular movements intact.  ?   Conjunctiva/sclera: Conjunctivae normal.  ?Cardiovascular:  ?   Rate and Rhythm: Normal rate and regular rhythm.  ?   Pulses: Normal pulses.  ?   Heart sounds: Normal heart sounds, S1 normal and S2 normal. No murmur heard. ?  No friction rub. No gallop.  ?Pulmonary:  ?   Effort: Pulmonary effort is normal. No respiratory distress.  ?   Breath sounds: Normal breath sounds.  ?Abdominal:  ?   General: Bowel sounds are normal.  ?   Palpations: Abdomen is soft.  ?   Tenderness: There is no abdominal tenderness. There is no guarding or rebound.  ?  Hernia: No hernia is present.  ?Musculoskeletal:     ?   General: No swelling.  ?   Cervical back: Full passive range of motion without pain, normal range of motion and neck supple. No spinous process tenderness or muscular tenderness. Normal range of motion.  ?   Right lower leg: No edema.  ?   Left lower leg: No edema.  ?Skin: ?   General: Skin is warm and dry.  ?   Capillary Refill: Capillary refill takes less than 2 seconds.  ?   Findings: No ecchymosis, erythema, rash or wound.  ?Neurological:  ?   General: No focal deficit present.  ?   Mental Status: She is alert and oriented to person, place, and time.  ?   GCS: GCS eye subscore is 4. GCS verbal subscore is 5. GCS motor subscore is 6.  ?   Cranial Nerves: Cranial  nerves 2-12 are intact.  ?   Sensory: Sensation is intact.  ?   Motor: Motor function is intact.  ?   Coordination: Coordination is intact.  ?Psychiatric:     ?   Attention and Perception: Attention normal.     ?   Mood and Affect: Mood normal.     ?   Speech: Speech normal.     ?   Behavior: Behavior normal.  ? ? ?ED Results / Procedures / Treatments   ?Labs ?(all labs ordered are listed, but only abnormal results are displayed) ?Labs Reviewed - No data to display ? ?EKG ?None ? ?Radiology ?No results found. ? ?Procedures ?Procedures  ? ? ?Medications Ordered in ED ?Medications - No data to display ? ?ED Course/ Medical Decision Making/ A&P ?  ?                        ?Medical Decision Making ? ?Patient presents to the emergency department for evaluation of intermittent episodes of pain, numbness, pins-and-needles on the left side of her body.  Patient reports that this includes her face, arm, chest, leg when it occurs.  She has not identified anything that causes the symptoms. ? ?Examination reveals normal strength in all extremities.  She has never noticed any associated shortness of breath.  With her facial involvement, cervical spine radiculopathy, lumbar radiculopathy unlikely.  It is felt that the 1 thing to rule out today would be MS.  We will perform MRI of brain and cervical spine with and without contrast.  If negative, will refer to neurology.  Will sign out to oncoming ER physician to follow-up on MRI. ? ? ? ? ? ? ? ?Final Clinical Impression(s) / ED Diagnoses ?Final diagnoses:  ?Paresthesia  ? ? ?Rx / DC Orders ?ED Discharge Orders   ? ? None  ? ?  ? ? ?  ?Gilda Crease, MD ?08/07/21 [phone] ? ?

## 2021-08-09 ENCOUNTER — Encounter: Payer: Self-pay | Admitting: Neurology

## 2021-08-09 ENCOUNTER — Ambulatory Visit: Payer: BC Managed Care – PPO | Admitting: Neurology

## 2021-08-09 VITALS — BP 121/84 | HR 74 | Ht 67.0 in | Wt 169.0 lb

## 2021-08-09 DIAGNOSIS — H539 Unspecified visual disturbance: Secondary | ICD-10-CM

## 2021-08-09 DIAGNOSIS — R9082 White matter disease, unspecified: Secondary | ICD-10-CM | POA: Diagnosis not present

## 2021-08-09 DIAGNOSIS — R208 Other disturbances of skin sensation: Secondary | ICD-10-CM

## 2021-08-09 DIAGNOSIS — E559 Vitamin D deficiency, unspecified: Secondary | ICD-10-CM | POA: Diagnosis not present

## 2021-08-09 DIAGNOSIS — R200 Anesthesia of skin: Secondary | ICD-10-CM

## 2021-08-09 MED ORDER — GABAPENTIN 300 MG PO CAPS: 300.0000 mg | ORAL_CAPSULE | Freq: Three times a day (TID) | ORAL | 5 refills | Status: DC

## 2021-08-09 NOTE — Progress Notes (Signed)
? ?[facility] ? ?PATIENT: Elizabeth MinisterMelanie C Williamson ?DOB: 03/28/1991 ? ?REFERRING DOCTOR OR PCP:  Ronna Poliohristopher Polina, MD ?SOURCE: Patient, notes from 08/07/2021 emergency room visit, imaging and lab reports, MRI images personally reviewed. ? ?_________________________________ ? ? ?HISTORICAL ? ?CHIEF COMPLAINT:  ?Chief Complaint  ?Patient presents with  ? New Patient (Initial Visit)  ?  RM 17 with Elnita Maxwellheryl, mother.  ED/Internal referral for paraesthesia/concern for MS. Still having numbness down whole left side/pain. Since this past Sunday, more pressure behind left eye. Left eye does not feel like it focuses sometimes. Wears glasses.  Last eye doctor visit 03/2021 (sees [facility]/ [place], [place]).   ? ? ?HISTORY OF PRESENT ILLNESS:  ?I had the pleasure of seeing patient, Elizabeth HeckMelanie Williamson, [facility] neurolog[facility] consultation regarding her paresthesias and abnormal brain MRI. ? ?She is a 31 year old woman who has experienced numbness nad tingling in te left side of her body starting a year ago.  In the more distant past, she would have episodes of numbness and pian in the left arm but not leg like more recently.     Last week, she had more increased pain in her left side and back and difficulty with vision.   She noted blurriness out of her left eye.   She feels these symptoms are the same today as a few days ago when she went to the ED 08/07/2021.    She last saw ophthalmology Decembr 2022.  At that time the left eye was mildly worse than her right eye (we don;t have records).    ? ?She went to the ED October 2022 due to LBP and numbness in the left leg.   Imaging was not performed.   She had a prednisone pack and symptoms improved.     ? ?She saw [facility] a couple weeks ago and was told no Ortho issue and that she should see Neuology.     ? ?She had migraines as a teenager but no typical migraines x many years.   She reports a chemical exposure last year and had  headache ? ?Vascular risks:   No cardiac issues except some palpitations,   she does not smoke.  No DM or HTN. ? ?She was a full term twin and was breech and weighted 5  pounds 11 oz   No difficulty with pregnancy or delivery.  She reached all milestones.     ? 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     ? ? ?Imaging: ?MRI of the brain 08/07/2021 shows numerous T2/FLAIR hyperintense foci in the subcortical and deep white matter.  A couple foci are juxtacortical.  There are only a couple very small periventricular foci and no infratentorial foci.  None of the foci appear to be acute on diffusion-weighted imaging there was no enhancement.  Fourth ventricle larger than typical but adjacent brain is normal and this is unlikely to be significant. ? ?MRI of the cervical spine 08/07/2021 was normal ? ?Laboratory: ?CBC and BMP 08/07/2021 were normal ?07/11/2020: TB negative, hep B surface antibody positive, varicella IgG positive ? ? ?REVIEW OF SYSTEMS: ?Constitutional: No fevers, chills, sweats, or change in appetite ?Eyes: No visual changes, double vision, eye pain ?Ear, nose and throat: No hearing loss, ear pain, nasal congestion, sore throat ?Cardiovascular: No chest pain, palpitations ?Respiratory:  No shortness of breath at rest or with exertion.   No wheezes ?GastrointestinaI: No nausea, vomiting, diarrhea, abdominal pain, fecal incontinence ?Genitourinary:  No dysuria,  urinary retention or frequency.  No nocturia. ?Musculoskeletal:  No neck pain, back pain ?Integumentary: No rash, pruritus, skin lesions ?Neurological: as above ?Psychiatric: No depression at this time.  No anxiety ?Endocrine: No palpitations, diaphoresis, change in appetite, change in weigh or increased thirst ?Hematologic/Lymphatic:  No anemia, purpura, petechiae. ?Allergic/Immunologic: No itchy/runny eyes, nasal congestion, recent allergic reactions, rashes ? ?ALLERGIES: ?No Known Allergies ? ?HOME MEDICATIONS: ? ?Current  Outpatient Medications:  ?  albuterol (PROAIR HFA) 108 (90 Base) MCG/ACT inhaler, Inhale 1-2 puffs into the lungs every 4 (four) hours as needed., Disp: 18 g, Rfl: 1 ?  cetirizine (ZYRTEC) 10 MG tablet, Take 10 mg by mouth daily., Disp: , Rfl:  ?  fluticasone (FLONASE) 50 MCG/ACT nasal spray, Place 2 sprays into both nostrils daily. (Patient not taking: Reported on 08/07/2021), Disp: 16 g, Rfl: 6 ?  ibuprofen (ADVIL) 200 MG tablet, Take 600 mg by mouth 2 (two) times daily as needed., Disp: , Rfl:  ?  Lactulose 20 GM/30ML SOLN, Take 30 mLs (20 g total) by mouth 2 (two) times daily as needed. (Patient not taking: Reported on 09/23/2019), Disp: 300 mL, Rfl: 0 ?  linaclotide (LINZESS) 72 MCG capsule, Take 1 capsule (72 mcg total) by mouth daily before breakfast. (Patient not taking: Reported on 08/07/2021), Disp: 30 capsule, Rfl: 6 ?  Multiple Vitamin (MULTIVITAMIN WITH MINERALS) TABS tablet, Take 2 tablets by mouth daily., Disp: , Rfl:  ?  NUVARING 0.12-0.015 MG/24HR vaginal ring, Place vaginally., Disp: , Rfl:  ?  predniSONE (STERAPRED UNI-PAK 21 TAB) 10 MG (21) TBPK tablet, Take 10 mg by mouth See admin instructions. Take 10 mg by mouth once., Disp: , Rfl:  ? ?PAST MEDICAL HISTORY: ?Past Medical History:  ?Diagnosis Date  ? Alopecia   ? got injections through dermatologist  ? Migraine   ? rare  ? ? ?PAST SURGICAL HISTORY: ?Past Surgical History:  ?Procedure Laterality Date  ? HERNIA REPAIR  2021  ? ? ?FAMILY HISTORY: ?Family History  ?Problem Relation Age of Onset  ? Heart disease Brother   ?     recently found to have enlarged heart (age 24)  ? Diabetes Maternal Grandmother   ? Cancer Maternal Grandfather   ?     colon cancer (60's)  ? Hypertension Paternal Grandmother   ? Cancer Paternal Uncle   ?     lung cancer (nonsmoker)  ? ? ?SOCIAL HISTORY: ? ?Social History  ? ?Socioeconomic History  ? Marital status: Married  ?  Spouse name: Not on file  ? Number of children: Not on file  ? Years of education: Not on file  ?  Highest education level: Not on file  ?Occupational History  ? Occupation: Psychologist, sport and exercise at [employer]  ?  Employer: [employer]  ?Tobacco Use  ? Smoking status: Former  ?  Packs/day: 0.50  ?  Years: 3.00  ?  Pack years: 1.50  ?  Types: Cigarettes  ?  Quit date: 08/14/2013  ?  Years since quitting: 7.9  ? Smokeless tobacco: Never  ? Tobacco comments:  ?  increased to 3 pack/week  ?Vaping Use  ? Vaping Use: Never used  ?Substance and Sexual Activity  ? Alcohol use: Yes  ?  Alcohol/week: 0.0 standard drinks  ?  Comment: Occasional social drinker, every other weekend  ? Drug use: No  ? Sexual activity: Yes  ?  Partners: Male  ?  Birth control/protection: None  ?Other Topics Concern  ? Not  on file  ?Social History Narrative  ? Right handed  ? Coffee- 12 oz per day  ? 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  ? 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 No pets. 1 son (Dexton) born 10/12/15.  No passive tobacco exposure.   ? ?Social Determinants of Health  ? ?Financial Resource Strain: Not on file  ?Food Insecurity: Not on file  ?Transportation Needs: Not on file  ?Physical Activity: Not on file  ?Stress: Not on file  ?Social Connections: Not on file  ?Intimate Partner Violence: Not on file  ? ? ? ?PHYSICAL EXAM ? ?Vitals:  ? 08/09/21 1016  ?BP: 121/84  ?Pulse: 74  ?Weight: 169 lb (76.7 kg)  ?Height: 5\' 7"  (1.702 m)  ? ? ?Body mass index is 26.47 kg/m?. ? ? ?General: The patient is well-developed and well-nourished and in no acute distress ? ?HEENT:  Head is [place]/AT.  Sclera are anicteric.  Funduscopic exam shows normal optic discs and retinal vessels. ? ?Neck: No carotid bruits are noted.  The neck is nontender. ? ?Cardiovascular: The heart has a regular rate and rhythm with a normal S1 and S2. There were no murmurs, gallops or rubs.   ? ?Skin: Extremities are without rash or  edema. ? ?Musculoskeletal:  Back is nontender ? ?Neurologic Exam ? ?Mental status: The patient is alert and oriented x 3 at the time of the examination. The patient has apparent normal  recent and remote memory, with an apparently normal attention span and concentration ability.   Speech is normal. ? ?Cranial nerves: Extraocular movements are full. Pupils are equal, round, and reactive to light and accomoda

## 2021-08-10 ENCOUNTER — Telehealth: Payer: Self-pay | Admitting: Neurology

## 2021-08-10 NOTE — Telephone Encounter (Signed)
[employer] no auth req spoke to Tiburones L ref # [id number]. Sent a message to Misty Stanley she will reach out to the patient to schedule.  ?

## 2021-08-11 ENCOUNTER — Encounter: Payer: Self-pay | Admitting: Neurology

## 2021-08-11 LAB — VITAMIN D 25 HYDROXY (VIT D DEFICIENCY, FRACTURES): Vit D, 25-Hydroxy: 32.3 ng/mL (ref 30.0–100.0)

## 2021-08-11 LAB — ANCA PROFILE
Anti-MPO Antibodies: <0.2 units (ref 0.0–0.9)
Anti-PR3 Antibodies: <0.2 units (ref 0.0–0.9)
Atypical pANCA: <1:20 {titer}
C-ANCA: <1:20 {titer}
P-ANCA: <1:20 {titer}

## 2021-08-11 LAB — SEDIMENTATION RATE: Sed Rate: 13 mm/hr (ref 0–32)

## 2021-08-11 LAB — HOMOCYSTEINE: Homocysteine: 9.7 umol/L (ref 0.0–14.5)

## 2021-08-11 LAB — VITAMIN B12: Vitamin B-12: 638 pg/mL (ref 232–1245)

## 2021-08-11 LAB — C-REACTIVE PROTEIN: CRP: <1 mg/L (ref 0–10)

## 2021-08-13 ENCOUNTER — Emergency Department (HOSPITAL_COMMUNITY): Payer: BC Managed Care – PPO

## 2021-08-13 ENCOUNTER — Encounter (HOSPITAL_COMMUNITY): Payer: Self-pay | Admitting: Emergency Medicine

## 2021-08-13 ENCOUNTER — Emergency Department (HOSPITAL_COMMUNITY)
Admission: EM | Admit: 2021-08-13 | Discharge: 2021-08-13 | Disposition: A | Discharge status: Home / Self Care | Payer: BC Managed Care – PPO | Attending: Emergency Medicine | Admitting: Emergency Medicine

## 2021-08-13 DIAGNOSIS — R200 Anesthesia of skin: Secondary | ICD-10-CM | POA: Diagnosis not present

## 2021-08-13 DIAGNOSIS — Z7951 Long term (current) use of inhaled steroids: Secondary | ICD-10-CM | POA: Diagnosis not present

## 2021-08-13 DIAGNOSIS — R079 Chest pain, unspecified: Secondary | ICD-10-CM

## 2021-08-13 DIAGNOSIS — J4520 Mild intermittent asthma, uncomplicated: Secondary | ICD-10-CM | POA: Diagnosis not present

## 2021-08-13 DIAGNOSIS — R519 Headache, unspecified: Secondary | ICD-10-CM | POA: Insufficient documentation

## 2021-08-13 DIAGNOSIS — R531 Weakness: Secondary | ICD-10-CM | POA: Insufficient documentation

## 2021-08-13 LAB — BASIC METABOLIC PANEL
Anion gap: 9 (ref 5–15)
BUN: 11 mg/dL (ref 6–20)
CO2: 22 mmol/L (ref 22–32)
Calcium: 9.5 mg/dL (ref 8.9–10.3)
Chloride: 107 mmol/L (ref 98–111)
Creatinine, Ser: 0.74 mg/dL (ref 0.44–1.00)
GFR, Estimated: >60 mL/min (ref >60)
Glucose, Bld: 103 mg/dL — ABNORMAL HIGH (ref 70–99)
Potassium: 3.8 mmol/L (ref 3.5–5.1)
Sodium: 138 mmol/L (ref 135–145)

## 2021-08-13 LAB — CBC
HCT: 40.3 % (ref 36.0–46.0)
Hemoglobin: 13 g/dL (ref 12.0–15.0)
MCH: 29 pg (ref 26.0–34.0)
MCHC: 32.3 g/dL (ref 30.0–36.0)
MCV: 89.8 fL (ref 80.0–100.0)
Platelets: 228 10*3/uL (ref 150–400)
RBC: 4.49 MIL/uL (ref 3.87–5.11)
RDW: 12.3 % (ref 11.5–15.5)
WBC: 5.3 10*3/uL (ref 4.0–10.5)
nRBC: 0 % (ref 0.0–0.2)

## 2021-08-13 LAB — TROPONIN I (HIGH SENSITIVITY): Troponin I (High Sensitivity): <2 ng/L (ref <18)

## 2021-08-13 LAB — I-STAT BETA HCG BLOOD, ED (MC, WL, AP ONLY): I-stat hCG, quantitative: <5 m[IU]/mL (ref <5)

## 2021-08-13 LAB — TSH: TSH: 0.659 u[IU]/mL (ref 0.350–4.500)

## 2021-08-13 IMAGING — CR DG CHEST 2V
2 series · 2 of 2 positions shown · non-contrast
Comparison: [DATE]

CLINICAL DATA: Chest pain

EXAM:
CHEST - 2 VIEW

[chest pa]
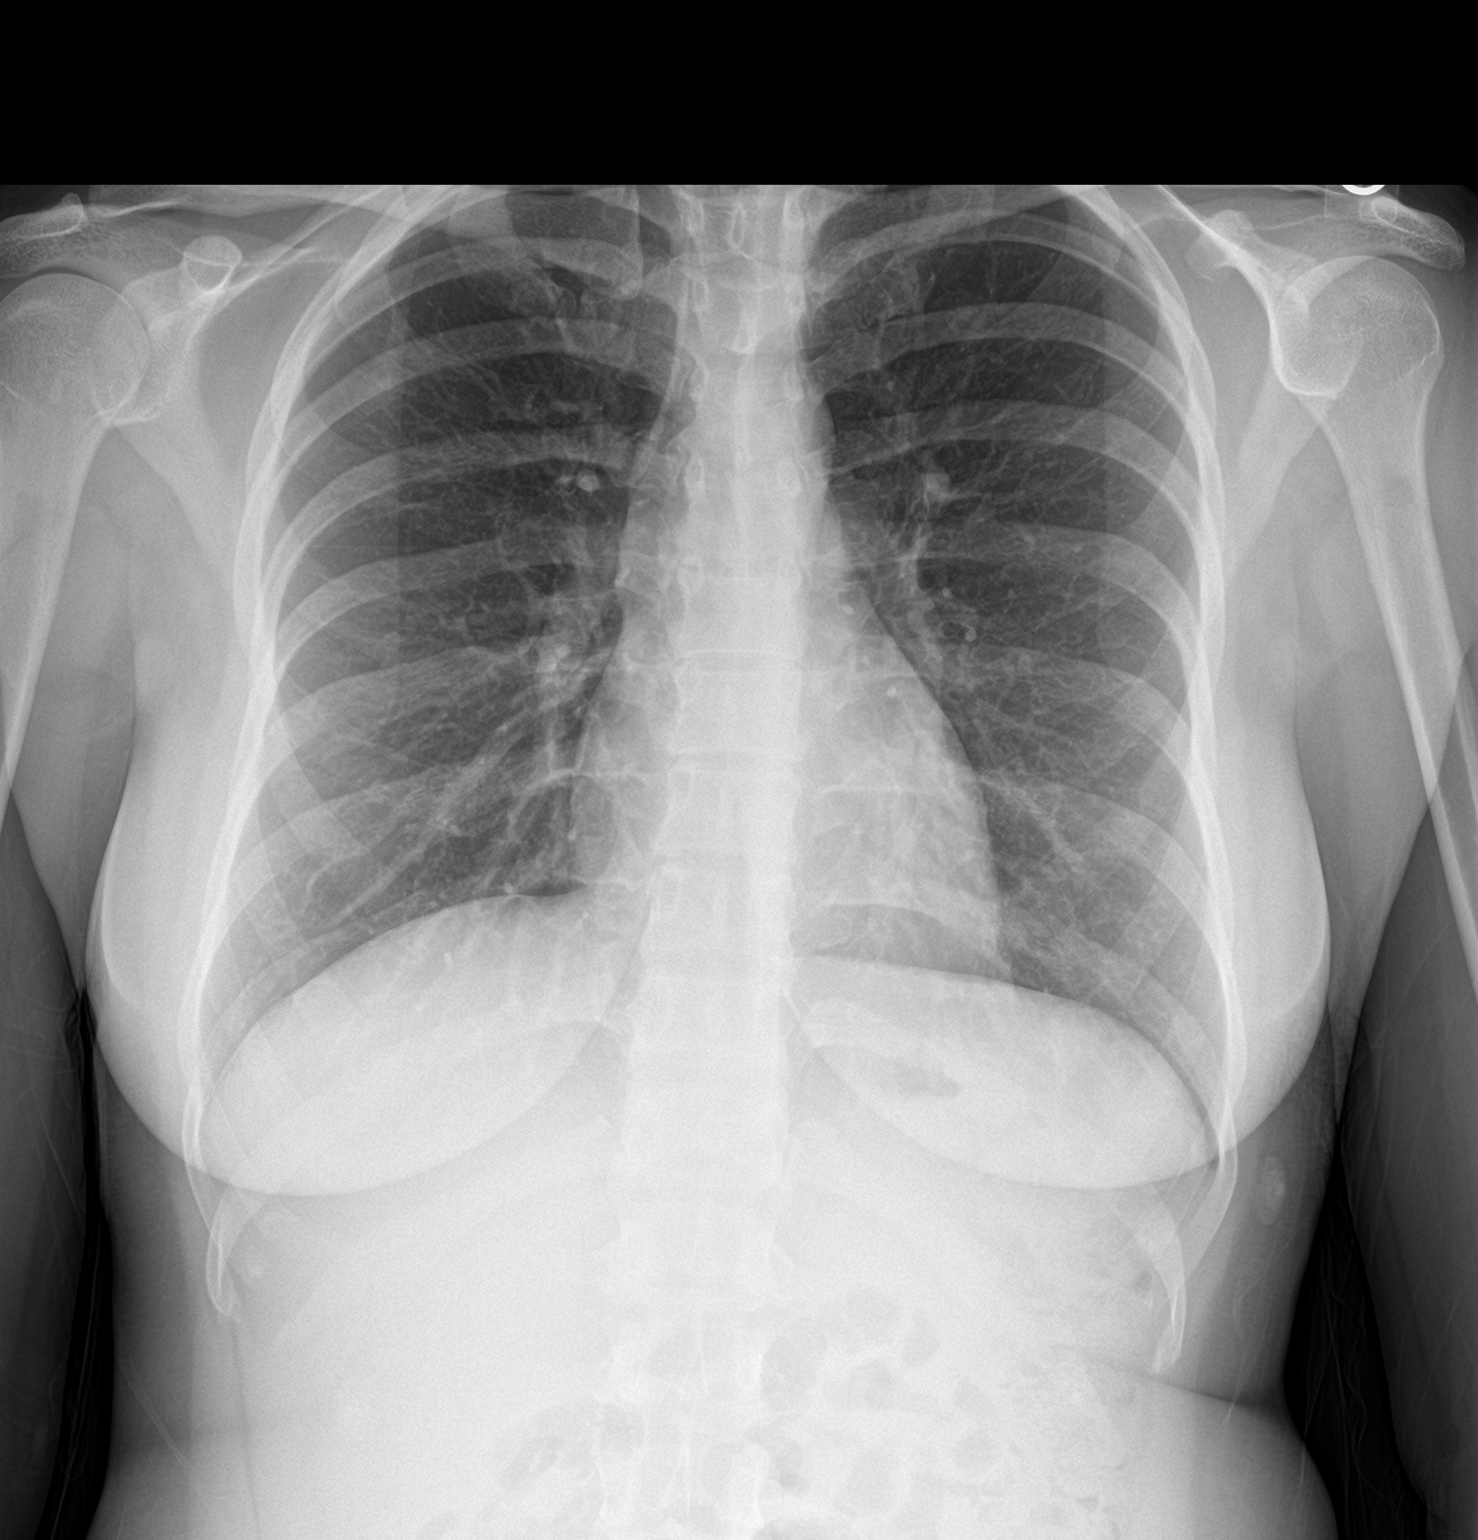

[chest lat]
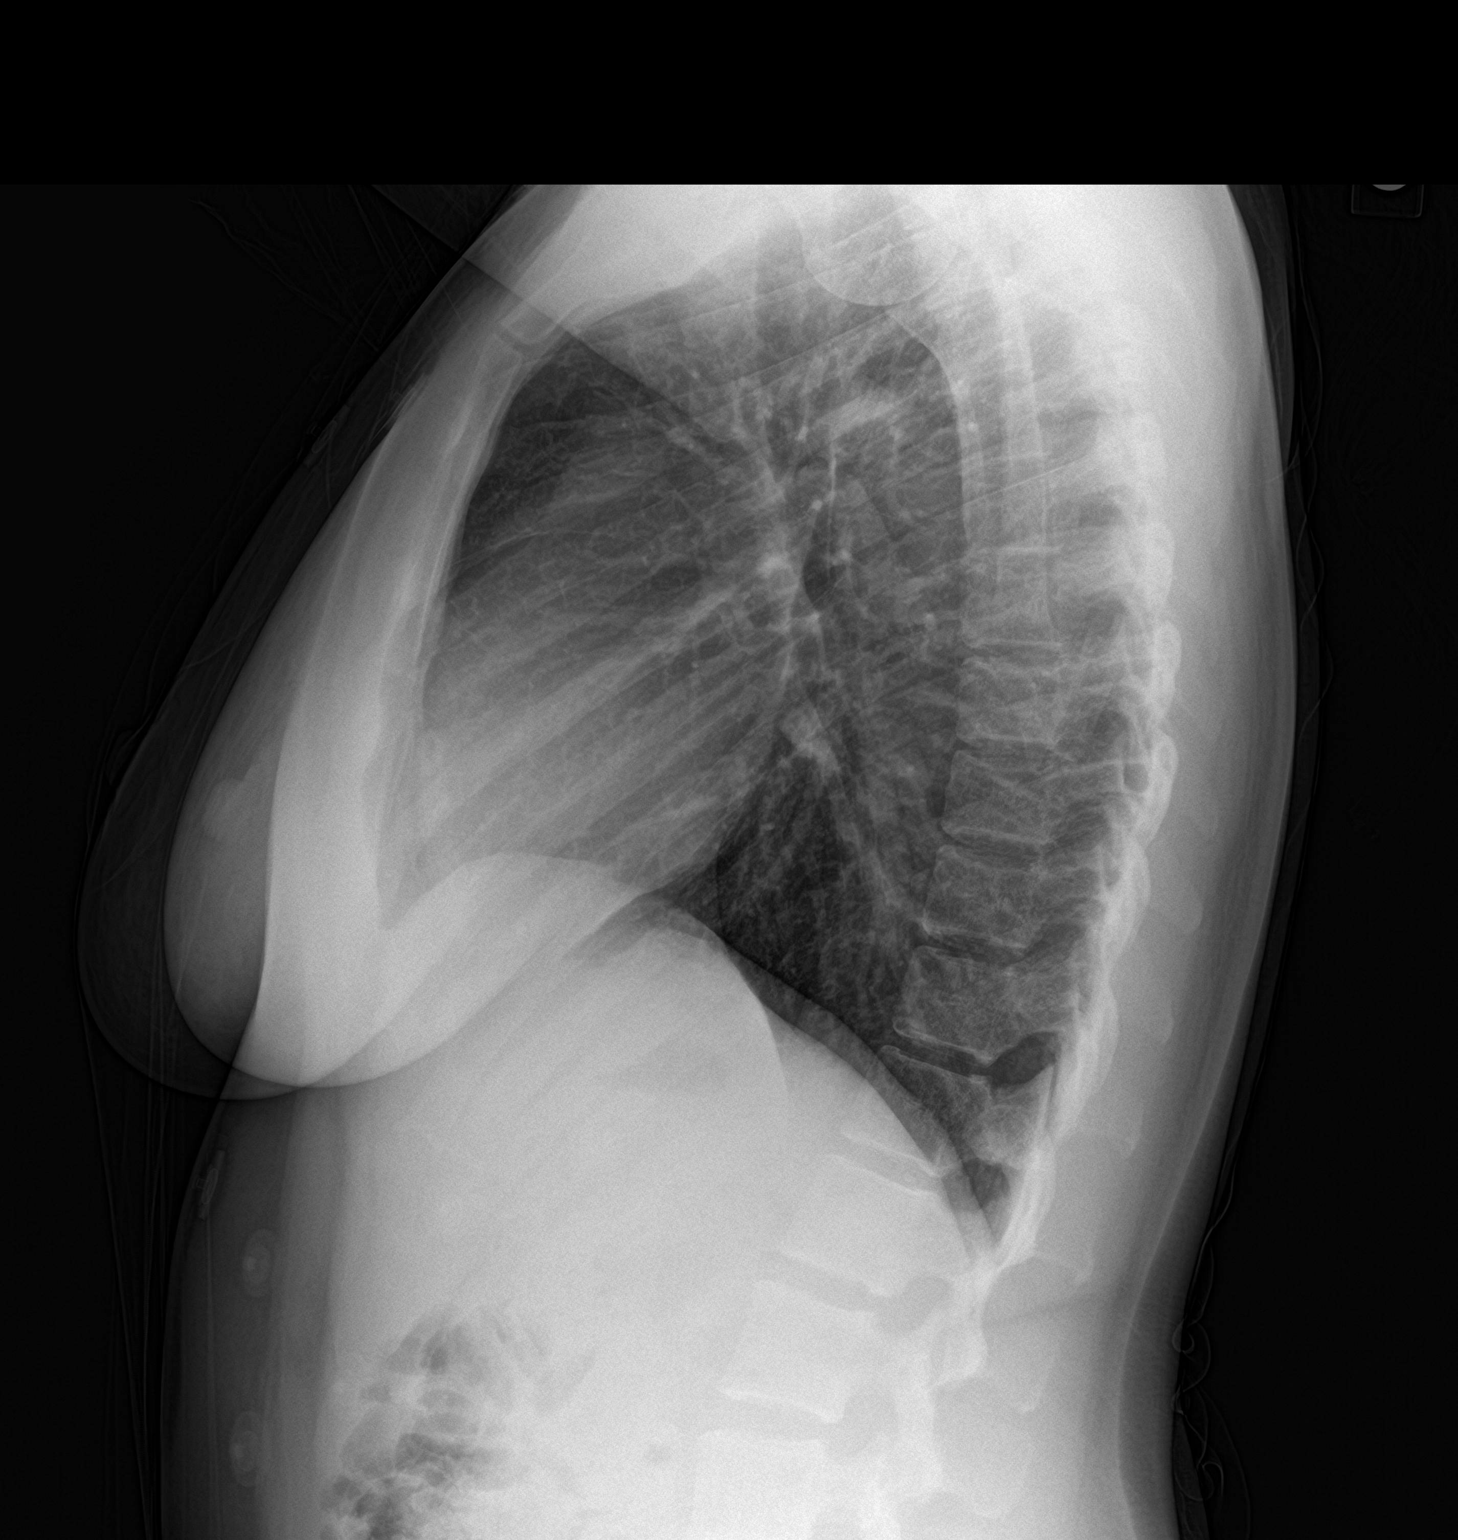

[2 of 2 positions shown; findings below may reference images not displayed]

FINDINGS: The heart size and mediastinal contours are within normal limits.
Both lungs are clear. The visualized skeletal structures are
unremarkable.
IMPRESSION: No active cardiopulmonary disease.

## 2021-08-13 MED ORDER — PROCHLORPERAZINE EDISYLATE 10 MG/2ML IJ SOLN
10.0000 mg | Freq: Once | INTRAMUSCULAR | Status: AC
  Administered 2021-08-13: 10 mg via INTRAVENOUS
  Filled 2021-08-13: qty 2

## 2021-08-13 MED ORDER — SODIUM CHLORIDE 0.9 % IV BOLUS
500.0000 mL | Freq: Once | INTRAVENOUS | Status: AC
Start: 2021-08-13 — End: 2021-08-13
  Administered 2021-08-13: 500 mL via INTRAVENOUS

## 2021-08-13 MED ORDER — DEXAMETHASONE SODIUM PHOSPHATE 10 MG/ML IJ SOLN
10.0000 mg | Freq: Once | INTRAMUSCULAR | Status: AC
Start: 2021-08-13 — End: 2021-08-13
  Administered 2021-08-13: 10 mg via INTRAVENOUS
  Filled 2021-08-13: qty 1

## 2021-08-13 NOTE — ED Provider Notes (Signed)
?[facility] EMERGENCY DEPARTMENT ?Provider Note ? ? ?CSN: [id number] ?Arrival date & time: 08/13/21  1155 ? ?  ? ?History ? ?Chief Complaint  ?Patient presents with  ? Chest Pain  ? ? ?KENIDY CROSSLAND is a 31 y.o. female. ? ?31 year old female with past medical history of migraines presents with complaint of left-sided body numbness, generalized weakness, left-sided headache.  Patient reports symptoms intermittent for the past several months, worse for the past week or so, seen in the emergency room with same on 08/07/2021, had abnormal MRI and was then seen in neurology clinic in follow-up.  Review of neurology note, not felt to be MS, recommend echo to evaluate for PFO.  Patient presents today with worsening left-sided headache in hopes of obtaining echo today, currently scheduled for 5/5.  Chest pain at times, reports feeling like her heart is racing however when she checks her pulse on her Apple Watch heart rate is in the 70s. ? ? ?  ? ?Home Medications ?Prior to Admission medications   ?Medication Sig Start Date End Date Taking? Authorizing Provider  ?albuterol (PROAIR HFA) 108 (90 Base) MCG/ACT inhaler Inhale 1-2 puffs into the lungs every 4 (four) hours as needed. 10/27/18   Elson Areas, PA-C  ?cetirizine (ZYRTEC) 10 MG tablet Take 10 mg by mouth daily.    [provider]  ?fluticasone (FLONASE) 50 MCG/ACT nasal spray Place 2 sprays into both nostrils daily. ?Patient not taking: Reported on 08/07/2021 06/19/19   Doristine Bosworth, MD  ?gabapentin (NEURONTIN) 300 MG capsule Take 1 capsule (300 mg total) by mouth 3 (three) times daily. 08/09/21   Sater, Pearletha Furl, MD  ?ibuprofen (ADVIL) 200 MG tablet Take 600 mg by mouth 2 (two) times daily as needed.    [provider]  ?Lactulose 20 GM/30ML SOLN Take 30 mLs (20 g total) by mouth 2 (two) times daily as needed. ?Patient not taking: Reported on 09/23/2019 08/21/19   Verlee Monte, NP  ?linaclotide Karlene Einstein) 72 MCG capsule Take 1  capsule (72 mcg total) by mouth daily before breakfast. ?Patient not taking: Reported on 08/07/2021 10/13/19   Midge Minium, MD  ?Multiple Vitamin (MULTIVITAMIN WITH MINERALS) TABS tablet Take 2 tablets by mouth daily.    [provider]  ?NUVARING 0.12-0.015 MG/24HR vaginal ring Place vaginally. 06/22/21   [provider]  ?predniSONE (STERAPRED UNI-PAK 21 TAB) 10 MG (21) TBPK tablet Take 10 mg by mouth See admin instructions. Take 10 mg by mouth once. 08/02/21   [provider]  ?   ? ?Allergies    ?Patient has no known allergies.   ? ?Review of Systems   ?Review of Systems ?Negative except as per HPI ?Physical Exam ?Updated Vital Signs ?BP 133/75 (BP Location: Right Arm)   Pulse 91   Temp 98.1 ?F (36.7 ?C) (Oral)   Resp 16   SpO2 98%  ?Physical Exam ?Vitals and nursing note reviewed.  ?Constitutional:   ?   General: She is not in acute distress. ?   Appearance: She is well-developed. She is not diaphoretic.  ?HENT:  ?   Head: Normocephalic and atraumatic.  ?   Mouth/Throat:  ?   Mouth: Mucous membranes are moist.  ?Eyes:  ?   Pupils: Pupils are equal, round, and reactive to light.  ?Cardiovascular:  ?   Rate and Rhythm: Normal rate and regular rhythm.  ?   Heart sounds: Normal heart sounds. No murmur heard. ?Pulmonary:  ?  Effort: Pulmonary effort is normal.  ?   Breath sounds: Normal breath sounds.  ?Chest:  ?   Chest wall: No tenderness.  ?Abdominal:  ?   Palpations: Abdomen is soft.  ?   Tenderness: There is no abdominal tenderness.  ?Musculoskeletal:  ?   Cervical back: Neck supple.  ?   Right lower leg: No tenderness. No edema.  ?   Left lower leg: No tenderness. No edema.  ?Skin: ?   General: Skin is warm and dry.  ?   Findings: No ecchymosis, erythema or rash.  ?Neurological:  ?   Mental Status: She is alert and oriented to person, place, and time.  ?   Cranial Nerves: No cranial nerve deficit.  ?   Motor: No weakness.  ?   Comments: Moves extremities equally. Is able to draw a  line down the middle of her face/body to show she is numb on the left side only.   ?Psychiatric:     ?   Behavior: Behavior normal.  ? ? ?ED Results / Procedures / Treatments   ?Labs ?(all labs ordered are listed, but only abnormal results are displayed) ?Labs Reviewed  ?BASIC METABOLIC PANEL - Abnormal; Notable for the following components:  ?    Result Value  ? Glucose, Bld 103 (*)   ? All other components within normal limits  ?CBC  ?TSH  ?I-STAT BETA HCG BLOOD, ED ([facility], [facility], [facility] ONLY)  ?TROPONIN I (HIGH SENSITIVITY)  ? ? ?EKG ?None ? ?Radiology ?DG Chest 2 View ? ?Result Date: 08/13/2021 ?CLINICAL DATA:  Chest pain EXAM: CHEST - 2 VIEW COMPARISON:  11/03/2013 FINDINGS: The heart size and mediastinal contours are within normal limits. Both lungs are clear. The visualized skeletal structures are unremarkable. IMPRESSION: No active cardiopulmonary disease. Electronically Signed   By: Signa Kell M.D.   On: 08/13/2021 12:41   ? ?Procedures ?Procedures  ? ? ?Medications Ordered in ED ?Medications  ?dexamethasone (DECADRON) injection 10 mg (has no administration in time range)  ?prochlorperazine (COMPAZINE) injection 10 mg (has no administration in time range)  ?sodium chloride 0.9 % bolus 500 mL (has no administration in time range)  ? ? ?ED Course/ Medical Decision Making/ A&P ?  ?                        ?Medical Decision Making ?Amount and/or Complexity of Data Reviewed ?Labs: ordered. ?Radiology: ordered. ? ? ?31 year old female presents with complaint of chest pain, left-sided body numbness, generalized weakness and left-sided headache.  Records reviewed including recent ER visit with MRI which was abnormal, recent follow-up visit with neurology, felt to not have a neurological source of her symptoms and was referred for an echo to rule out PFO.  This is scheduled in the coming week.  Patient presents today with worsening symptoms, states the gabapentin from neurology does help her to sleep.  Her exam is overall  reassuring no murmur, lungs clear to auscultation.  Able to move extremities fully.  EKG without ischemic changes.  Lab work including troponin are normal.  We will add on TSH for follow-up.  Patient is encouraged to establish care with a primary care provider, follow-up with neurology regarding her worsening symptoms.  Patient is given Decadron and Compazine with IV fluids for her headache today. Discussed with Dr. Stevie Kern, ER attending who has seen the patient, no further workup needed in the ER at this time.  ? ? ? ? ? ? ? ?  Final Clinical Impression(s) / ED Diagnoses ?Final diagnoses:  ?Nonspecific chest pain  ?Nonintractable episodic headache, unspecified headache type  ? ? ?Rx / DC Orders ?ED Discharge Orders   ? ? None  ? ?  ? ? ?  ?Jeannie FendMurphy, Adiyah Lame A, PA-C ?08/13/21 1442 ? ?  ?Milagros Lollykstra, Richard S, MD ?08/14/21 2030 ? ?

## 2021-08-13 NOTE — ED Triage Notes (Addendum)
Patient here with complaint of chest pain and left arm numbness that started one week ago at which point she was seen in the ED, improved but did not resolve over the course of the week, then started to get worse again at approximately 1000 today. Patient referred to neurology and was seen on Wednesday, states she has an echocardiogram scheduled for next week. Patient is alert, oriented, ambulatory, and in no apparent distress at this time. ? ?No dysarthria, no arm drift, grip strength equal bilaterally, sensation equal bilaterally in face, upper, and lower extremities. ?

## 2021-08-13 NOTE — Discharge Instructions (Signed)
Follow-up with your primary care provider for coordination of your care.  Recommend calling Dr. Ursula Alert office to check in following your visit today.  Follow-up as scheduled for your echo on May 5. ?

## 2021-08-14 DIAGNOSIS — Z0289 Encounter for other administrative examinations: Secondary | ICD-10-CM

## 2021-08-15 ENCOUNTER — Ambulatory Visit: Payer: Managed Care, Other (non HMO) | Admitting: Diagnostic Neuroimaging

## 2021-08-15 ENCOUNTER — Telehealth: Payer: Self-pay | Admitting: *Deleted

## 2021-08-15 NOTE — Telephone Encounter (Signed)
Gave completed/signed form back to medical records to process for pt. 

## 2021-08-18 ENCOUNTER — Ambulatory Visit (HOSPITAL_COMMUNITY): Payer: BC Managed Care – PPO | Attending: Internal Medicine

## 2021-08-18 DIAGNOSIS — R9082 White matter disease, unspecified: Secondary | ICD-10-CM | POA: Insufficient documentation

## 2021-08-18 LAB — ECHOCARDIOGRAM COMPLETE BUBBLE STUDY
Area-P 1/2: 3.63 cm2
S' Lateral: 2.7 cm

## 2021-08-21 ENCOUNTER — Encounter: Payer: Self-pay | Admitting: Neurology

## 2021-08-21 ENCOUNTER — Emergency Department
Admission: EM | Admit: 2021-08-21 | Discharge: 2021-08-22 | Disposition: A | Discharge status: Home / Self Care | Payer: BC Managed Care – PPO | Attending: Emergency Medicine | Admitting: Emergency Medicine

## 2021-08-21 ENCOUNTER — Encounter: Payer: Self-pay | Admitting: *Deleted

## 2021-08-21 ENCOUNTER — Emergency Department: Payer: BC Managed Care – PPO

## 2021-08-21 ENCOUNTER — Telehealth: Payer: Self-pay | Admitting: Psychiatry

## 2021-08-21 ENCOUNTER — Other Ambulatory Visit: Payer: Self-pay

## 2021-08-21 ENCOUNTER — Other Ambulatory Visit: Payer: Self-pay | Admitting: Psychiatry

## 2021-08-21 DIAGNOSIS — R519 Headache, unspecified: Secondary | ICD-10-CM | POA: Diagnosis not present

## 2021-08-21 DIAGNOSIS — R202 Paresthesia of skin: Secondary | ICD-10-CM | POA: Insufficient documentation

## 2021-08-21 DIAGNOSIS — R0789 Other chest pain: Secondary | ICD-10-CM | POA: Insufficient documentation

## 2021-08-21 DIAGNOSIS — R079 Chest pain, unspecified: Secondary | ICD-10-CM

## 2021-08-21 LAB — BASIC METABOLIC PANEL
Anion gap: 7 (ref 5–15)
BUN: 10 mg/dL (ref 6–20)
CO2: 24 mmol/L (ref 22–32)
Calcium: 9.6 mg/dL (ref 8.9–10.3)
Chloride: 106 mmol/L (ref 98–111)
Creatinine, Ser: 0.66 mg/dL (ref 0.44–1.00)
GFR, Estimated: >60 mL/min (ref >60)
Glucose, Bld: 102 mg/dL — ABNORMAL HIGH (ref 70–99)
Potassium: 3.5 mmol/L (ref 3.5–5.1)
Sodium: 137 mmol/L (ref 135–145)

## 2021-08-21 LAB — CBC
HCT: 38.4 % (ref 36.0–46.0)
Hemoglobin: 11.9 g/dL — ABNORMAL LOW (ref 12.0–15.0)
MCH: 27.3 pg (ref 26.0–34.0)
MCHC: 31 g/dL (ref 30.0–36.0)
MCV: 88.1 fL (ref 80.0–100.0)
Platelets: 221 10*3/uL (ref 150–400)
RBC: 4.36 MIL/uL (ref 3.87–5.11)
RDW: 12.2 % (ref 11.5–15.5)
WBC: 7.7 10*3/uL (ref 4.0–10.5)
nRBC: 0 % (ref 0.0–0.2)

## 2021-08-21 LAB — TROPONIN I (HIGH SENSITIVITY)
Troponin I (High Sensitivity): <2 ng/L (ref <18)
Troponin I (High Sensitivity): <2 ng/L (ref <18)

## 2021-08-21 IMAGING — CT CT HEAD W/O CM
4 series · 17 of 47 positions shown, 19 images · non-contrast
Comparison: MRI [DATE]

CLINICAL DATA: Right-sided face and shoulder numbness



[Series 2: head wo · axial · 0.45mm/px · z∈[-180,-36]mm · 7 of 39 slices shown, 9 images]
[im 5/39  brain]
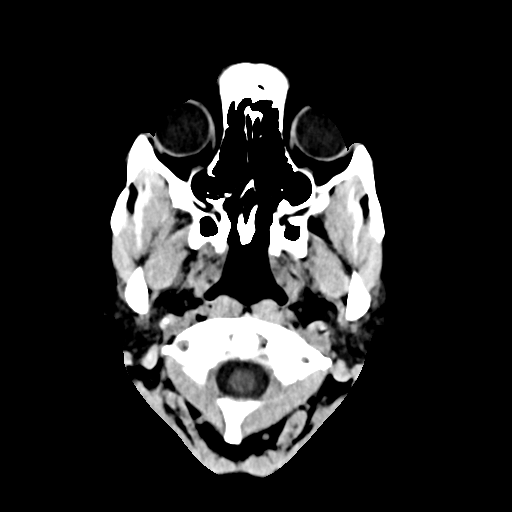
[im 5/39  bone]
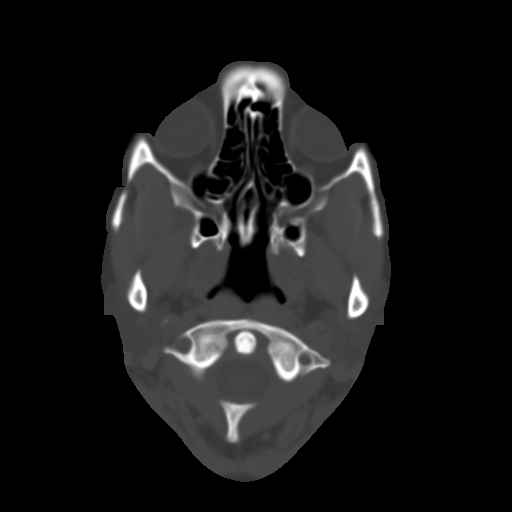
[im 10/39  brain]
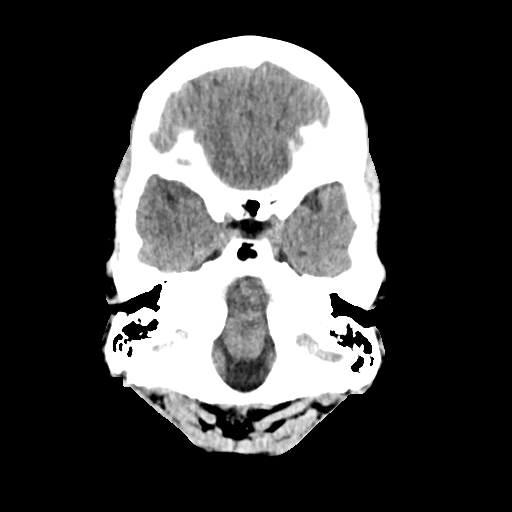
[im 15/39  brain]
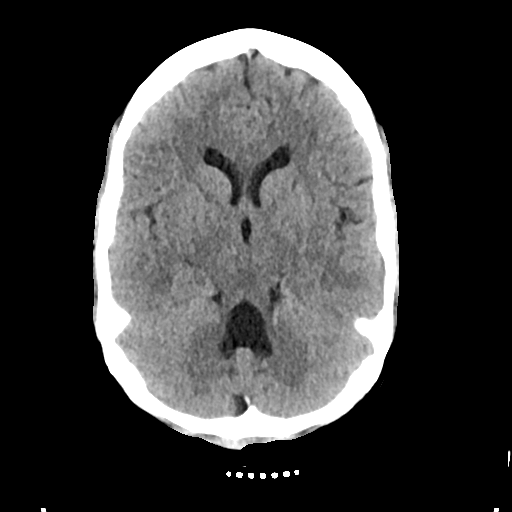
[im 20/39  brain]
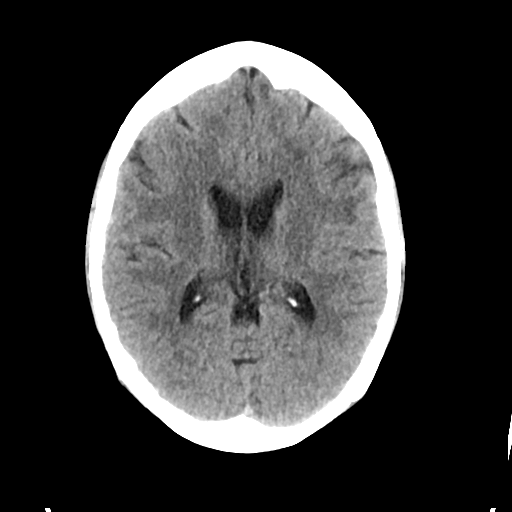
[im 24/39  brain]
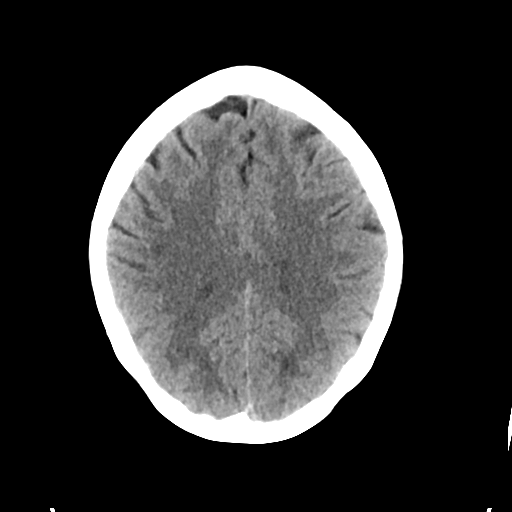
[im 24/39  bone]
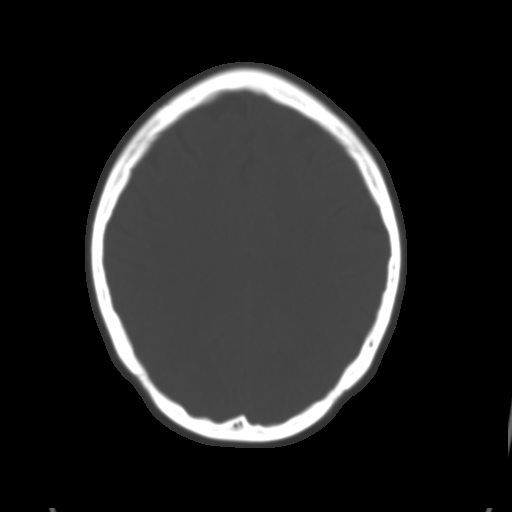
[im 29/39  brain]
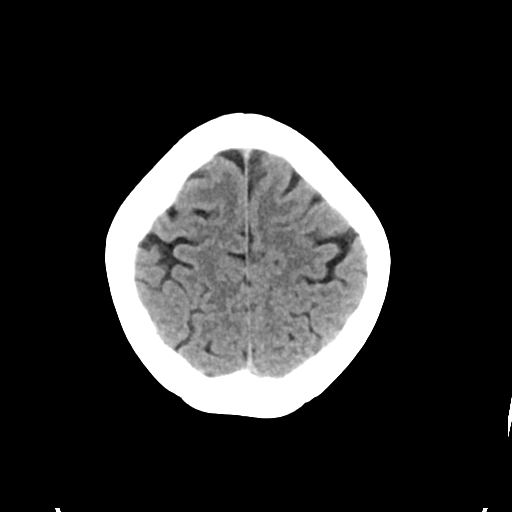
[im 34/39  brain]
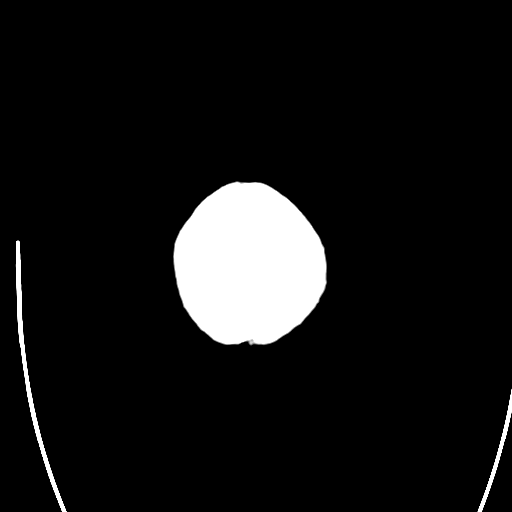

[Series 3: head bone · axial · 0.45mm/px · z∈[-182,-116]mm · 4 of 96 slices shown]
[im 10/96  bone]
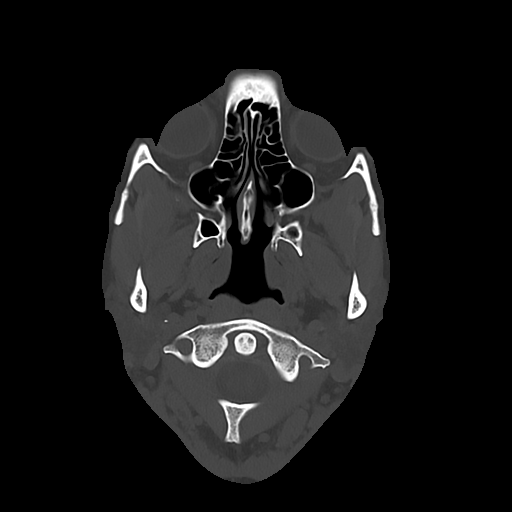
[im 20/96  bone]
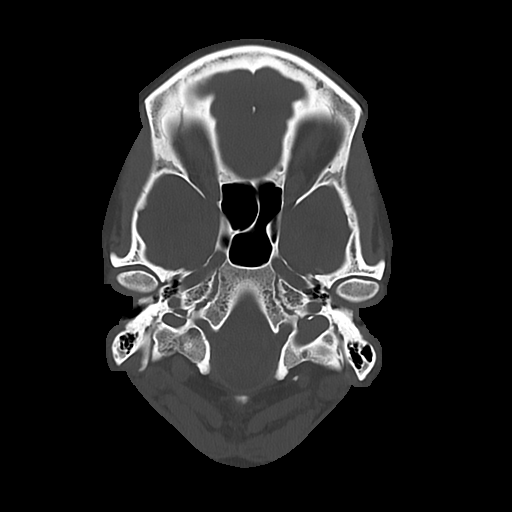
[im 29/96  bone]
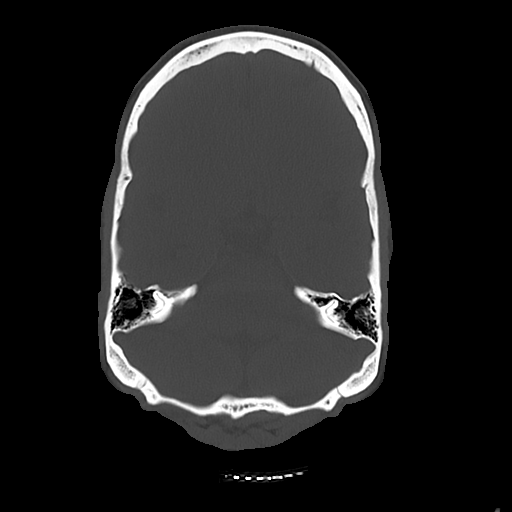
[im 43/96  bone]
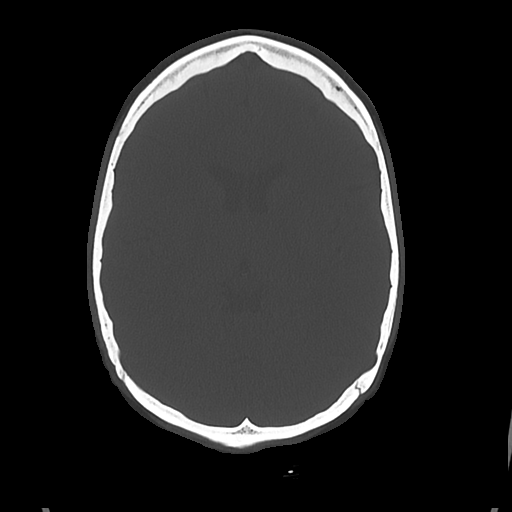

[Series 4: cor soft · coronal · 0.38mm/px · 3 of 75 slices shown]
[im 28/75  brain]
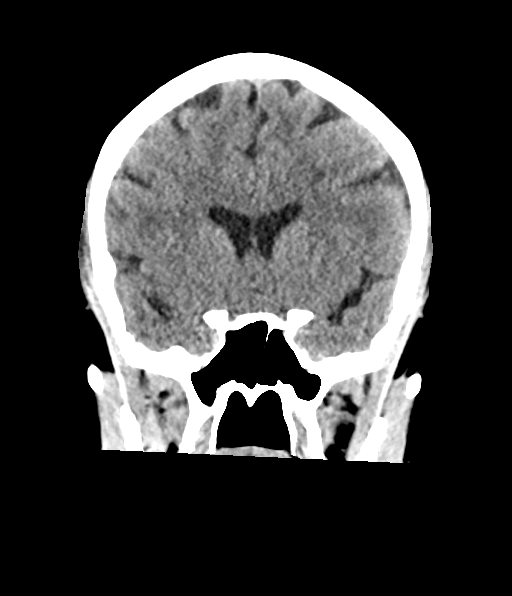
[im 35/75  brain]
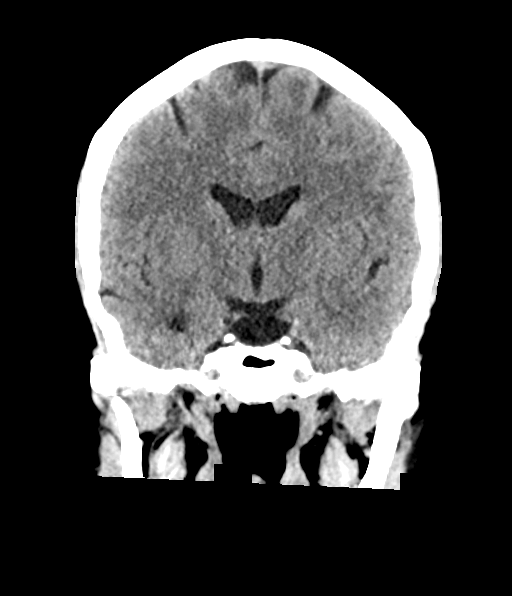
[im 41/75  brain]
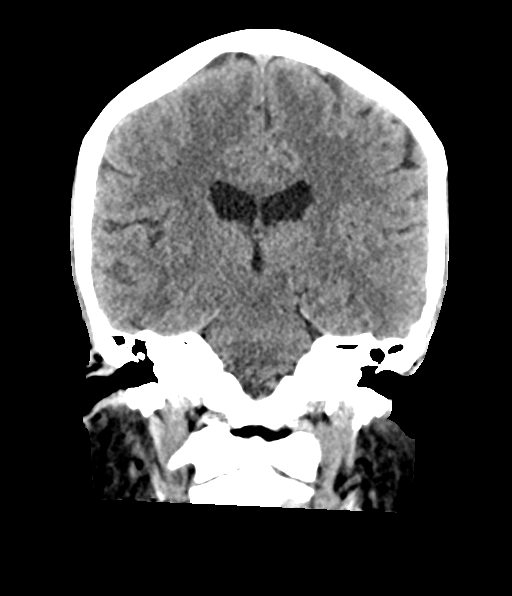

[Series 5: sag soft · sagittal · 0.45mm/px · 3 of 58 slices shown]
[im 20/58  brain]
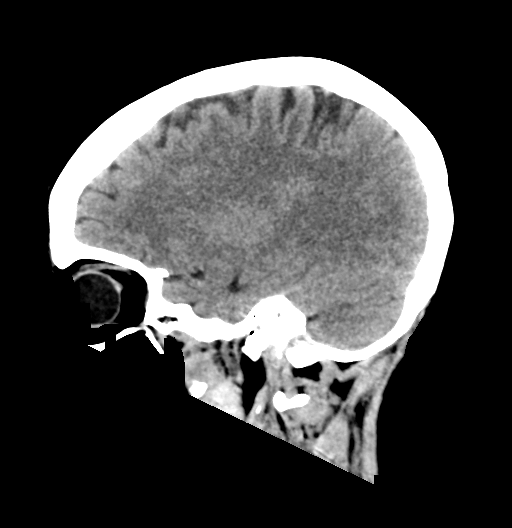
[im 29/58  brain]
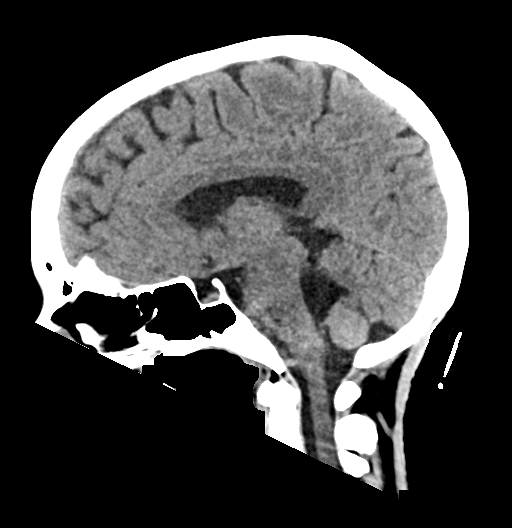
[im 39/58  brain]
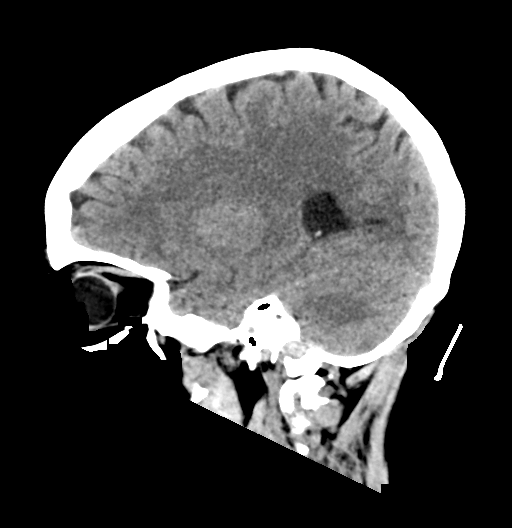

[17 of 47 positions shown; findings below may reference images not displayed]

FINDINGS: Brain: No acute territorial infarction, hemorrhage or intracranial
mass is visualized. Mild bilateral white matter hypodensities.
Nonenlarged ventricles.

Vascular: No hyperdense vessels.  No unexpected calcification

Skull: Normal. Negative for fracture or focal lesion.

Sinuses/Orbits: No acute finding.

Other: None
IMPRESSION: 1. No definite CT evidence for acute intracranial abnormality.
2. Multiple bilateral hypodense white matter lesions, see recently
performed MRI from [facility].

## 2021-08-21 IMAGING — CR DG CHEST 2V
2 series · 2 of 2 positions shown · non-contrast
Comparison: [DATE]

CLINICAL DATA: Right side numbness

EXAM:
CHEST - 2 VIEW

[chest pa]
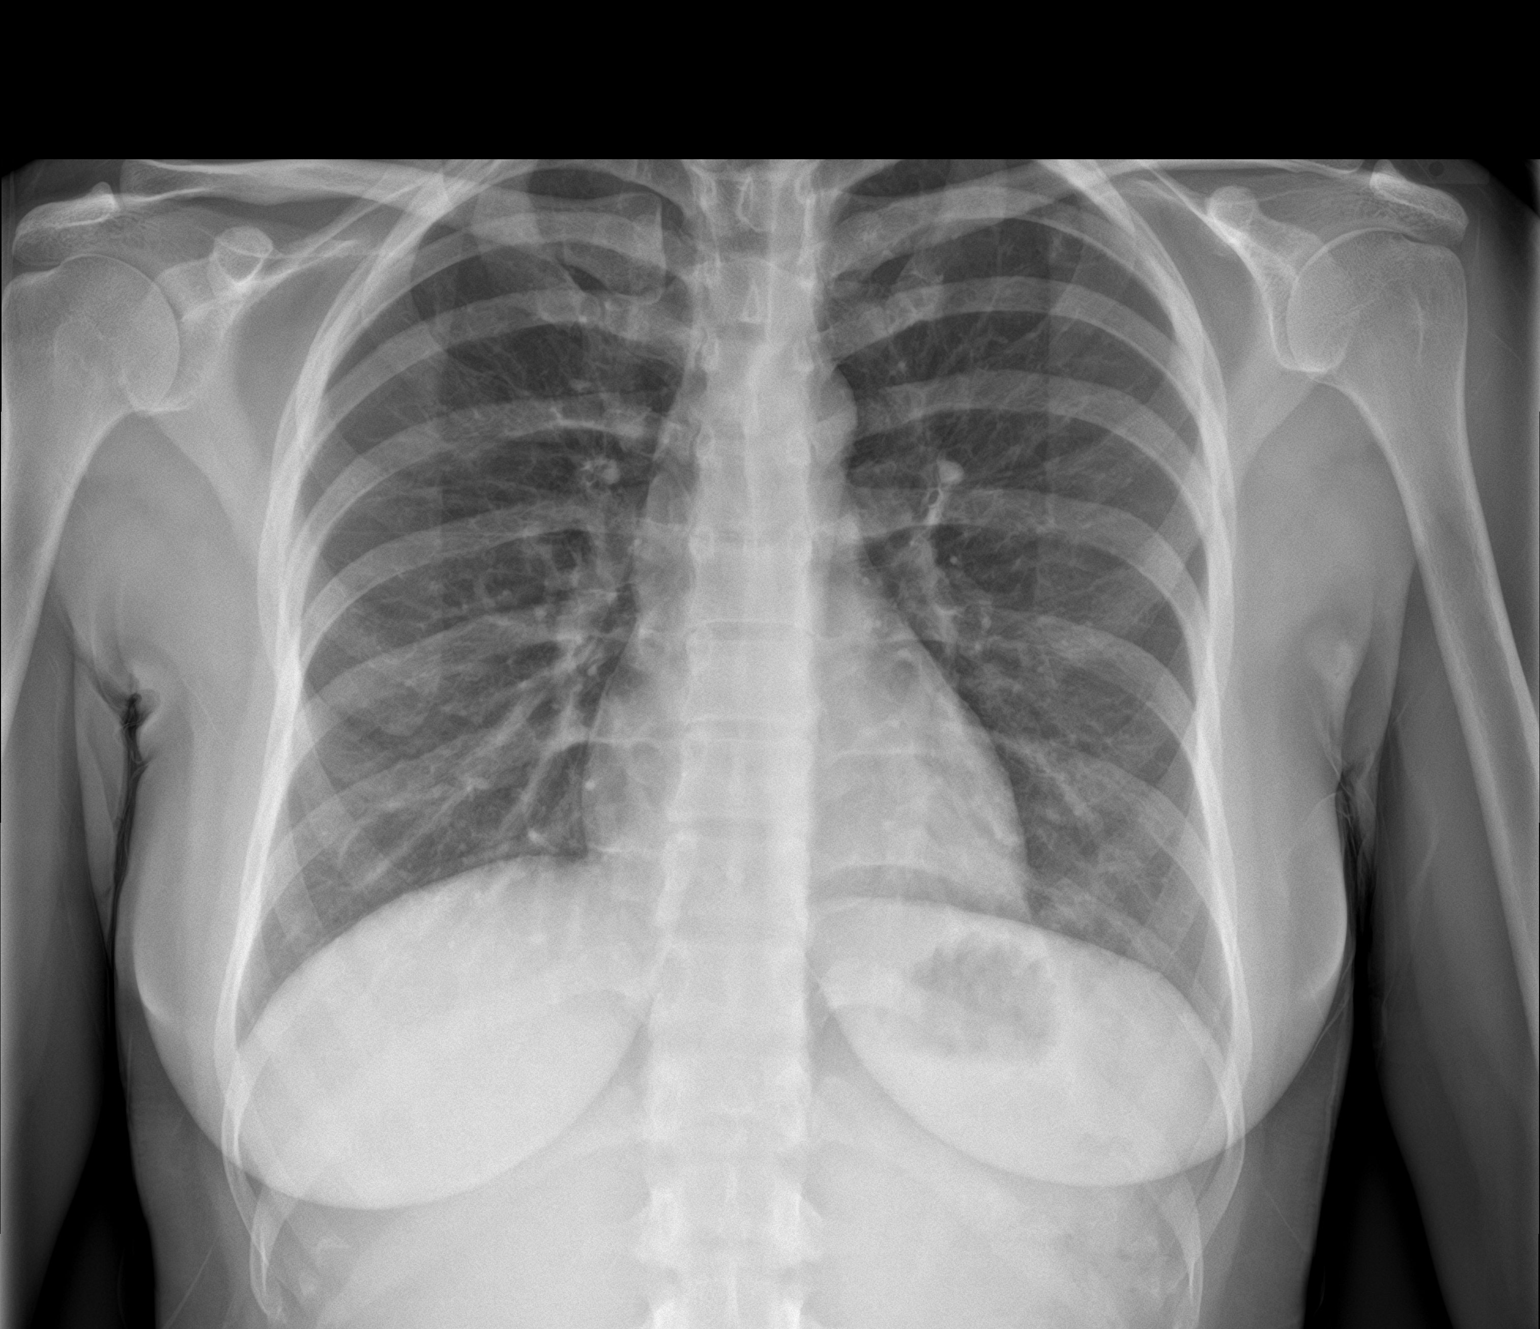

[chest lat]
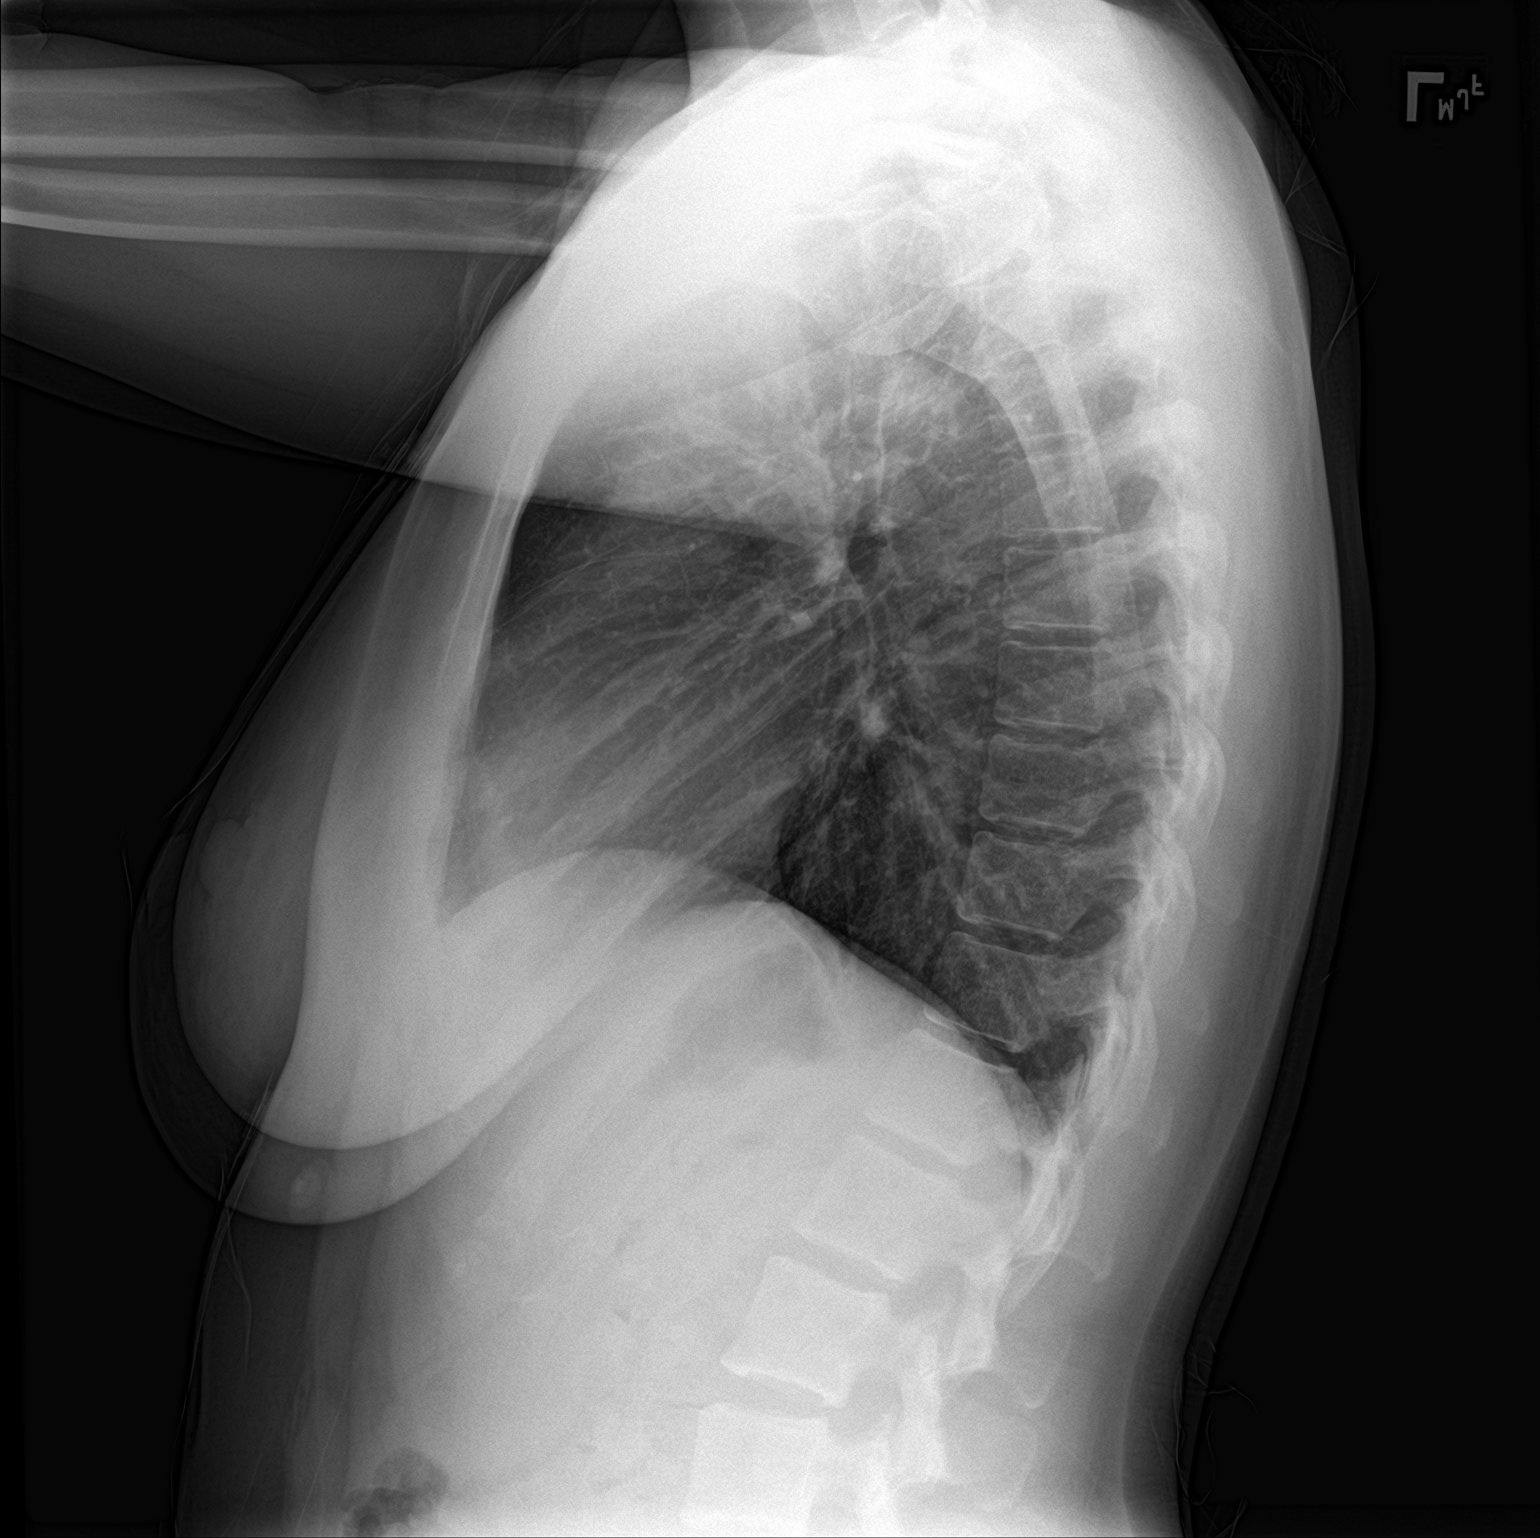

[2 of 2 positions shown; findings below may reference images not displayed]

FINDINGS: The heart size and mediastinal contours are within normal limits.
Both lungs are clear. The visualized skeletal structures are
unremarkable.
IMPRESSION: Normal study.

## 2021-08-21 NOTE — Telephone Encounter (Signed)
Received call that patient was experiencing new right sided numbness in her face and leg. Called patient back and she reported that she was in the ED getting evaluated. 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 CTH is ordered but not yet completed. She requested Dr. Epimenio Foot be informed of her symptoms when he returns to the office.  Let her know I would forward telephone note to him. All questions answered. ? ?Ocie Doyne ?08/21/21 ?9:33 PM ? ?

## 2021-08-21 NOTE — ED Provider Notes (Signed)
? ?[facility] ?Provider Note ? ? ? Event Date/Time  ? First MD Initiated Contact with Patient 08/21/21 2300   ?  (approximate) ? ? ?History  ? ?No chief complaint on file. ? ? ?HPI ? ?FAIZAH KLINGSHIRN is a 31 y.o. female with past medical history of migraines and subacute left-sided paresthesias and intermittent headaches currently being worked up by outpatient neurology having recently undergone MR brain on 4/24 there is remarkable for numerous small white matter lesions as well as having undergone recent echocardiogram on 5/5 that showed a shunt presents for evaluation of right-sided facial and leg paresthesias that she states around 5 PM today.  She states she is also had some chest tightness today.  No fevers, cough, abdominal pain nausea or vomiting.  No urinary symptoms.  Patient states she will sometimes feel winded with exertion but this is not any different today than it has been the last couple weeks.  No new leg pain redness or swelling.  There is a traumatic injuries or falls.  She is on hormonal birth control.  Denies EtOH use or illicit drug use.  She denies any weakness.  No recent trauma.  No specific headache at this time or visual changes or earache or sore throat.  She states she is on daily ASA prescribed by her neurologist.  She is very worried about the implications of shunt seen on her echo. ? ?  ?Past Medical History:  ?Diagnosis Date  ? Alopecia   ? got injections through dermatologist  ? Migraine   ? rare  ? ? ? ?Physical Exam  ?Triage Vital Signs: ?ED Triage Vitals [08/21/21 1834]  ?Enc Vitals Group  ?   BP (!) 136/94  ?   Pulse Rate 85  ?   Resp 18  ?   Temp 98.5 ?F (36.9 ?C)  ?   Temp Source Oral  ?   SpO2 99 %  ?   Weight 169 lb (76.7 kg)  ?   Height 5\' 7"  (1.702 m)  ?   Head Circumference   ?   Peak Flow   ?   Pain Score 4  ?   Pain Loc   ?   Pain Edu?   ?   Excl. in [place]?   ? ? ?Most recent vital signs: ?Vitals:  ? 08/21/21 2317 08/22/21 0218  ?BP: 118/84 110/76   ?Pulse: 82 80  ?Resp: 18 18  ?Temp:    ?SpO2: 100% 99%  ? ? ?General: Awake, no distress.  ?CV:  Good peripheral perfusion.  2+ radial pulse. ?Resp:  Normal effort.  Clear bilaterally. ?Abd:  No distention.  Soft. ?Other:  Cranial nerves II through XII grossly intact.  No pronator drift.  No finger dysmetria.  Symmetric 5/5 strength of all extremities.  Sensation intact to light touch in all extremities.  Unremarkable unassisted gait. ? ?Patient does state it feels different when this examiner touching her right face right upper extremity and right lower extremity compared to the left. ? ? ?ED Results / Procedures / Treatments  ?Labs ?(all labs ordered are listed, but only abnormal results are displayed) ?Labs Reviewed  ?BASIC METABOLIC PANEL - Abnormal; Notable for the following components:  ?    Result Value  ? Glucose, Bld 102 (*)   ? All other components within normal limits  ?CBC - Abnormal; Notable for the following components:  ? Hemoglobin 11.9 (*)   ? All other components within normal limits  ?  D-DIMER, QUANTITATIVE  ?HCG, QUANTITATIVE, PREGNANCY  ?TROPONIN I (HIGH SENSITIVITY)  ?TROPONIN I (HIGH SENSITIVITY)  ? ? ? ?EKG ? ?ECG is remarkable sinus rhythm with a ventricular rate of 85, normal axis, unremarkable intervals without evidence of acute ischemia or significant arrhythmia. ? ? ?RADIOLOGY ?CT head on my review shows no evidence of hemorrhage, edema, acute ischemia or mass effect.  I also reviewed radiology's findings and agree with their interpretation of multiple bilateral hypodense white matter lesions similar to prior MRI from April. ? ?Chest reviewed by myself shows no focal consoidation, effusion, edema, pneumothorax or other clear acute thoracic process. I also reviewed radiology interpretation and agree with findings described. ? ?MR brain on my interpretation without evidence of an acute CVA hemorrhage edema or other clear acute process.  I reviewed radiology interpretation and agree with  their findings of multiple scattered hyperintensity foci stable when compared to 4/24 without other clear acute process. ? ?PROCEDURES: ? ?Critical Care performed: No ? ?Procedures ? ? ? ?MEDICATIONS ORDERED IN ED: ?Medications  ?dexamethasone (DECADRON) injection 10 mg (has no administration in time range)  ? ? ? ?IMPRESSION / MDM / ASSESSMENT AND PLAN / ED COURSE  ?I reviewed the triage vital signs and the nursing notes. ?             ?               ? ?Differential diagnosis includes, but is not limited to complex migraine, CVA, metabolic derangement, anemia.  While symptoms are possibly suggestive of demyelinating disorder such as MS it seems this was considered by her neurologist recently felt this was less likely based on her MRI findings and was more concerned about possible embolic brain insult. ? ?With regards her chest pain differential considerations include ACS, pneumonia, PE, pneumothorax, arrhythmia, anemia and metabolic derangements as well.  Overall symptoms are not suggestive of dissection at this time.  He has no findings on exam to suggest a DVT. ? ? ?ECG is remarkable sinus rhythm with a ventricular rate of 85, normal axis, unremarkable intervals without evidence of acute ischemia or significant arrhythmia.  Troponins x2 are not elevated and below suspicion for ACS or myocarditis.  D-dimer is less than 0.5 and have low suspicion for PE.  BMP shows no significant electrolyte or metabolic derangements.  CBC shows no significant leukocytosis or acute anemia.  hCG is negative. ? ?CT head on my review shows no evidence of hemorrhage, edema, acute ischemia or mass effect.  I also reviewed radiology's findings and agree with their interpretation of multiple bilateral hypodense white matter lesions similar to prior MRI from April. ? ?Chest reviewed by myself shows no focal consoidation, effusion, edema, pneumothorax or other clear acute thoracic process. I also reviewed radiology interpretation and agree  with findings described. ? ?MR brain on my interpretation without evidence of an acute CVA hemorrhage edema or other clear acute process.  I reviewed radiology interpretation and agree with their findings of multiple scattered hyperintensity foci stable when compared to 4/24 without other clear acute process. ? ?Overall I have very low suspicion for CVA or immediate life-threatening process.  I did consult with teleneurologist to evaluate patient over video and recommended treating patient for possible atypical migraine I recommended no additional emergent work-up or admission and the patient would benefit from further outpatient work-up with her neurologist including possible nonemergent LP.  I discussed with patient.  She is amenable to trying some steroids otherwise deferring any Compazine at  this time.  He was given some Decadron.  Discharged stable condition.  Strict return precautions advised and discussed. ? ? ? ?  ? ? ?FINAL CLINICAL IMPRESSION(S) / ED DIAGNOSES  ? ?Final diagnoses:  ?Chest pain, unspecified type  ?Paresthesia  ? ? ? ?Rx / DC Orders  ? ?ED Discharge Orders   ? ? None  ? ?  ? ? ? ?Note:  This document was prepared using Dragon voice recognition software and may include unintentional dictation errors. ?  ?Lucrezia Starch, MD ?08/22/21 0236 ? ?

## 2021-08-21 NOTE — ED Provider Triage Note (Signed)
Emergency Medicine Provider Triage Evaluation Note ? ?Fredda Hammed, a 31 y.o. female  was evaluated in triage.  Pt complains of  ?Right-sided face shoulder numbness as well as some right leg numbness.  [facility] is [facility] [place] to rule out MS.  She apparently was referred to a cardiologist for outpatient echo which was found to reveal a positive bubble study.  Patient presents today after she made telephone contact with the on-call provider at [facility].  She voices some increased concern for her new symptoms.  She is tearful on presentation. ? ?Review of Systems  ?Positive: Facial numbness, RLE paresthesias ?Negative: CP, FCS ? ?Physical Exam  ?BP (!) 136/94 (BP Location: Left Arm)   Pulse 85   Temp 98.5 ?F (36.9 ?C) (Oral)   Resp 18   Ht 5\' 7"  (1.702 m)   Wt 76.7 kg   LMP  (LMP Unknown)   SpO2 99%   BMI 26.47 kg/m?  ?Gen:   Awake, no distress   ?Resp:  Normal effort CTA ?MSK:   Moves extremities without difficulty  ?Other:   ? ?Medical Decision Making  ?Medically screening exam initiated at 6:51 PM.  Appropriate orders placed.  GLENNIE BOSE was informed that the remainder of the evaluation will be completed by another provider, this initial triage assessment does not replace that evaluation, and the importance of remaining in the ED until their evaluation is complete. ? ?Patient to the ED for concern of a new symptoms of right-sided facial numbness and RLE paresthesias. ?  ?Fredda Hammed, PA-C ?08/21/21 1900 ? ?

## 2021-08-21 NOTE — Progress Notes (Signed)
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 I recommend she starts daily aspirin 81 mg for now while waiting for Dr. Epimenio Foot to return. I'll defer to Dr. Epimenio Foot if he wants to pursue any further workup.  ?

## 2021-08-21 NOTE — ED Triage Notes (Addendum)
Pt has numbness of right leg and right side of face for 1 month.  Sx began on the left side and now on the right side.  Pt had mri 2 weeks ago and echo last week.  Pt tearful   pt alert  speech clear.  Ambulating without diff.   Pt reports h/a's.  ?

## 2021-08-22 ENCOUNTER — Emergency Department: Payer: BC Managed Care – PPO

## 2021-08-22 LAB — D-DIMER, QUANTITATIVE: D-Dimer, Quant: 0.43 ug/mL-FEU (ref 0.00–0.50)

## 2021-08-22 LAB — HCG, QUANTITATIVE, PREGNANCY: hCG, Beta Chain, Quant, S: <1 m[IU]/mL (ref <5)

## 2021-08-22 IMAGING — MR MR HEAD W/O CM
12 series · 47 of 48 positions shown · non-contrast
Comparison: CT from [DATE] and MRI from [DATE].

CLINICAL DATA: Initial evaluation for neuro deficit, stroke
suspected.

EXAM:
MRI HEAD WITHOUT CONTRAST
TECHNIQUE: Multiplanar, multiecho pulse sequences of the brain and surrounding
structures were obtained without intravenous contrast.

[Series 5: ax dwi_tracew · axial · 3.0mm · 0.65mm/px · z∈[-108,+40]mm · 3 of 48 slices shown]
[im 1/48]
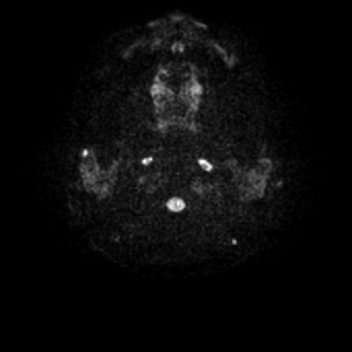
[im 24/48]
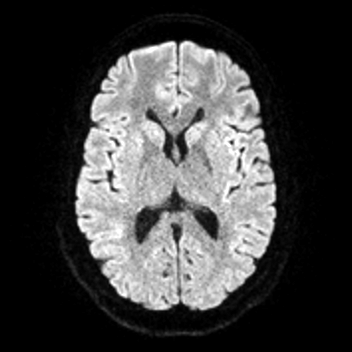
[im 48/48]
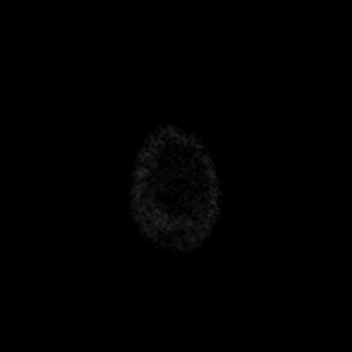

[Series 6: ax dwi_adc · axial · 3.0mm · 0.65mm/px · z∈[-108,+40]mm · 3 of 48 slices shown]
[im 1/48]
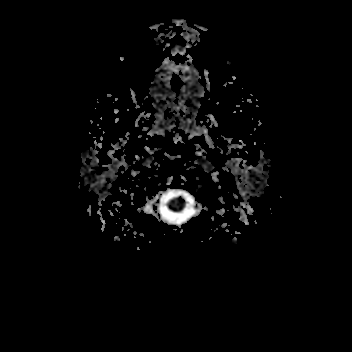
[im 24/48]
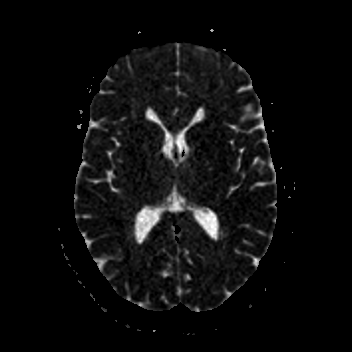
[im 48/48]
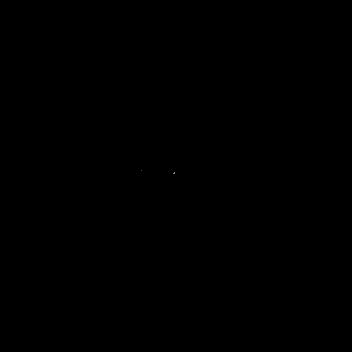

[Series 7: cor dwi_tracew · coronal · 5.0mm · 0.65mm/px · 3 of 40 slices shown]
[im 1/40]
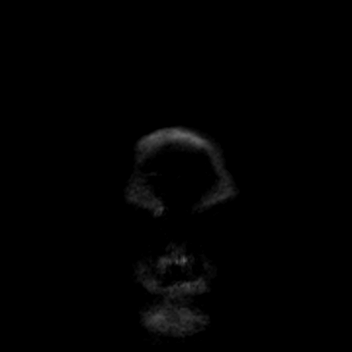
[im 20/40]
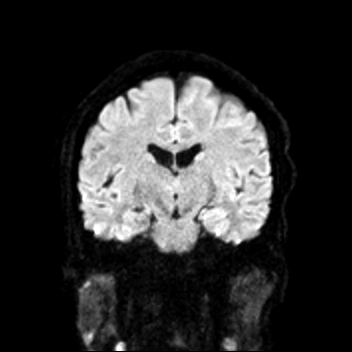
[im 40/40]
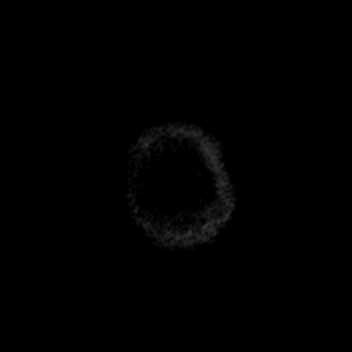

[Series 8: cor dwi_adc · coronal · 5.0mm · 0.65mm/px · 3 of 40 slices shown]
[im 1/40]
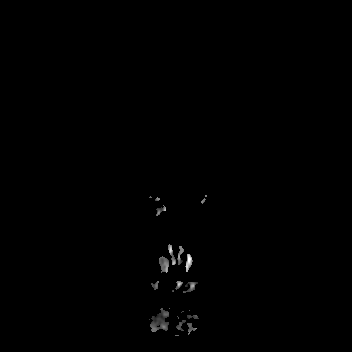
[im 20/40]
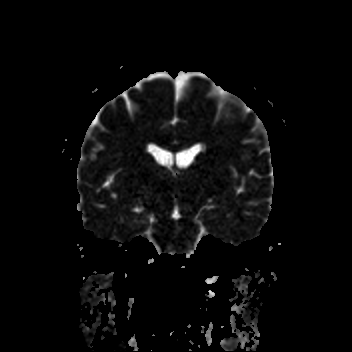
[im 40/40]
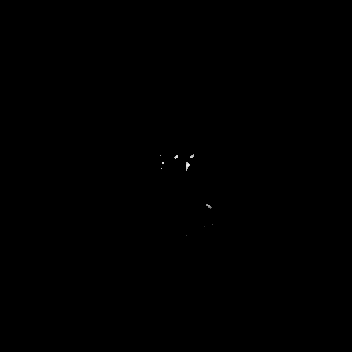

[Series 9: T1 · sagittal · 5.0mm · 0.62mm/px · 2 of 22 slices shown (1 of 2)]
[im 1/22]
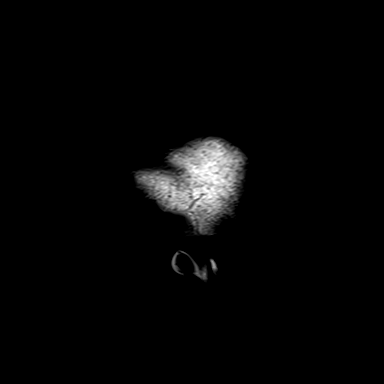
[im 22/22]
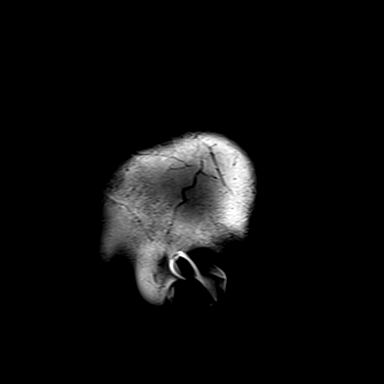

[Series 10: T2 · axial · 5.0mm · 0.53mm/px · z∈[-101,+36]mm · 2 of 25 slices shown (1 of 2)]
[im 1/25]
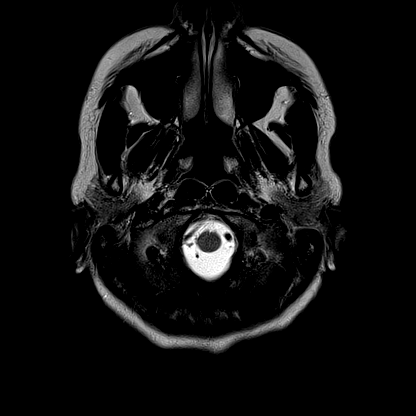
[im 25/25]
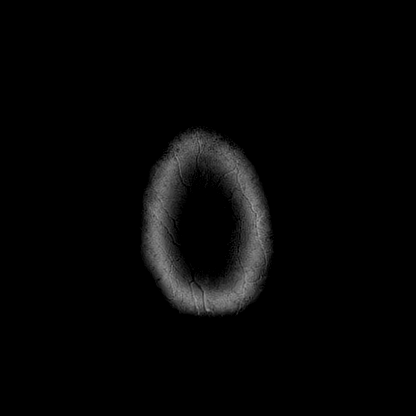

[Series 12: pha_images · axial · 3.0mm · 0.90mm/px · z∈[-119,+50]mm · 5 of 60 slices shown]
[im 1/60]
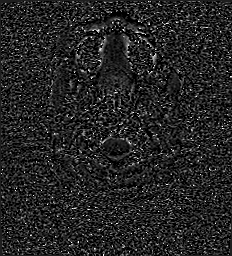
[im 15/60]
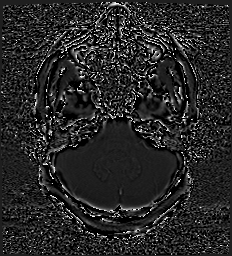
[im 30/60]
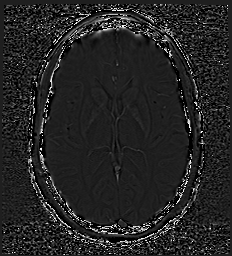
[im 45/60]
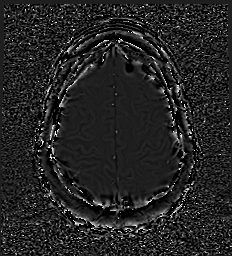
[im 60/60]
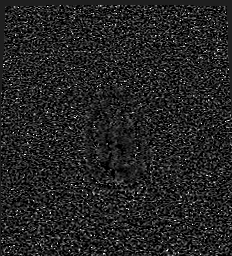

[Series 13: swi_images · axial · 3.0mm · 0.90mm/px · z∈[-119,+50]mm · 5 of 60 slices shown]
[im 1/60]
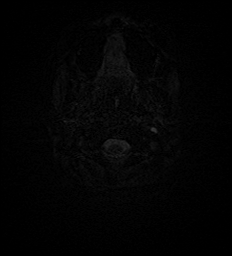
[im 15/60]
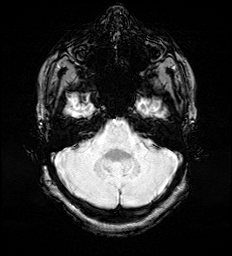
[im 30/60]
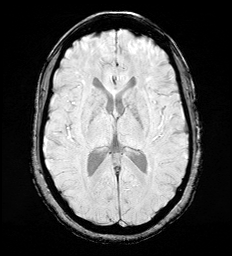
[im 45/60]
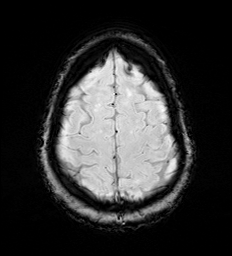
[im 60/60]
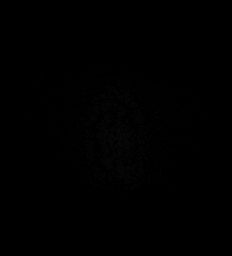

[Series 15: FLAIR · axial · 3.0mm · 0.53mm/px · z∈[-110,+44]mm · 4 of 55 slices shown (1 of 2)]
[im 1/55]
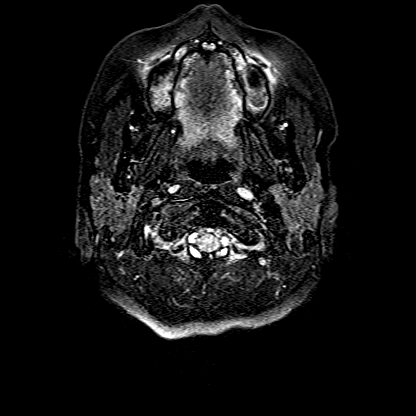
[im 19/55]
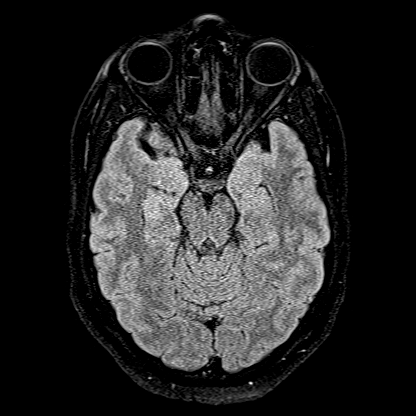
[im 37/55]
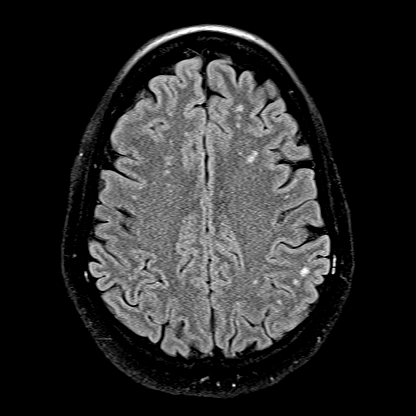
[im 55/55]
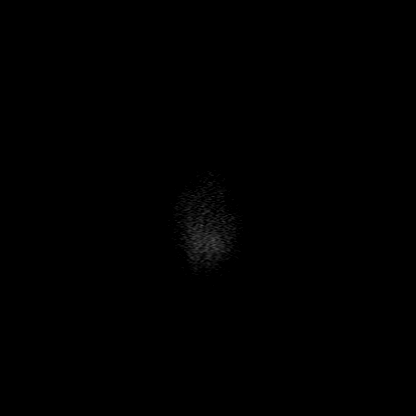

[Series 16: T1 · axial · 1.0mm · 0.98mm/px · z∈[-120,+46]mm · 13 of 176 slices shown (2 of 2)]
[im 1/176]
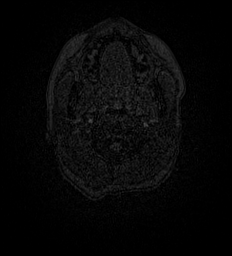
[im 14/176]
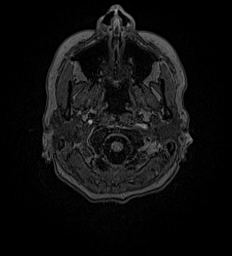
[im 27/176]
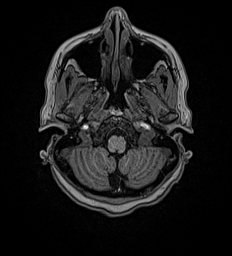
[im 41/176]
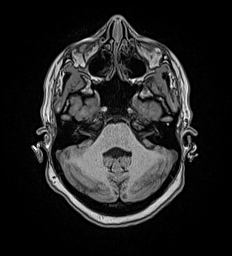
[im 54/176]
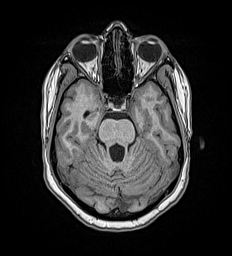
[im 68/176]
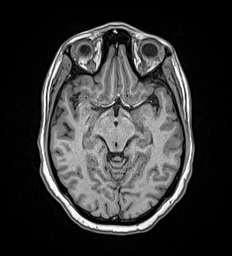
[im 81/176]
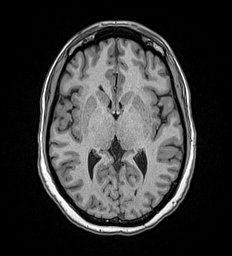
[im 95/176]
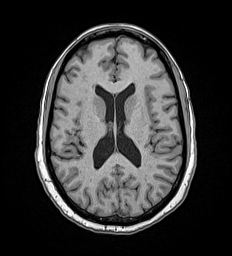
[im 108/176]
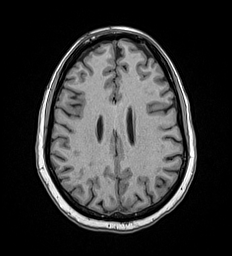
[im 122/176]
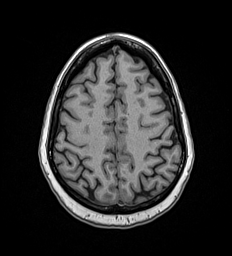
[im 135/176]
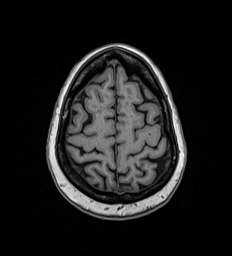
[im 149/176]
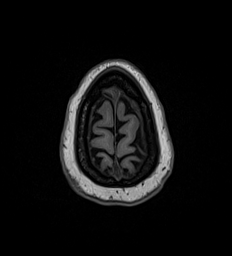
[im 176/176]
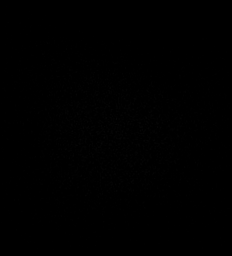

[Series 17: T2 · coronal · 5.0mm · 0.57mm/px · 2 of 29 slices shown (2 of 2)]
[im 1/29]
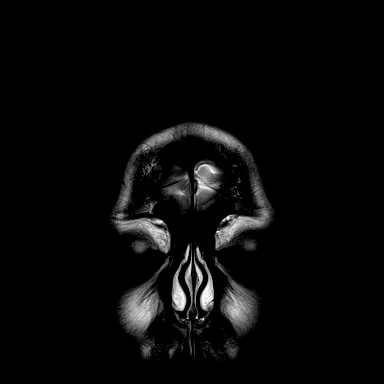
[im 29/29]
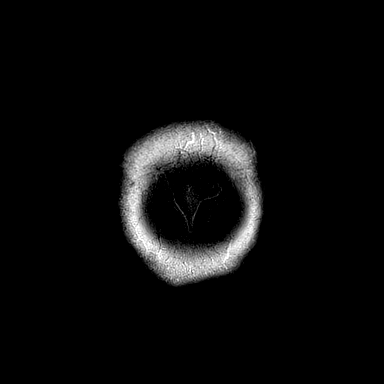

[Series 18: FLAIR · sagittal · 5.0mm · 0.94mm/px · 2 of 21 slices shown (2 of 2)]
[im 1/21]
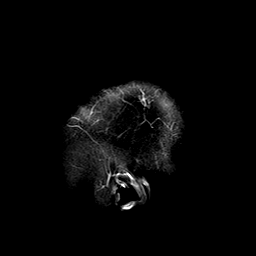
[im 21/21]
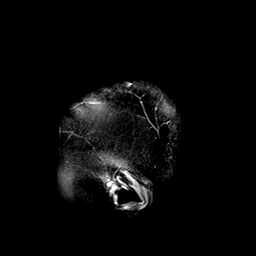

[47 of 48 positions shown; findings below may reference images not displayed]

FINDINGS: Brain: Cerebral volume stable, and remains within normal limits.
Again seen are multiple scattered subcentimeter foci of T2/FLAIR
hyperintensity involving the periventricular, deep, and subcortical
white matter of both cerebral hemispheres, nonspecific.

No abnormal foci of restricted diffusion to suggest acute or
subacute ischemia. Gray-white matter differentiation maintained. No
areas of chronic cortical infarction. No acute or chronic
intracranial blood products.

No mass lesion, midline shift or mass effect. No hydrocephalus or
extra-axial fluid collection. Pituitary gland suprasellar region
normal. Midline structures intact.

Vascular: Major intracranial vascular flow voids are maintained.

Skull and upper cervical spine: Craniocervical junction within
normal limits. Bone marrow signal intensity normal. No scalp soft
tissue abnormality.

Sinuses/Orbits: Globes and orbital soft tissues within normal
limits. Paranasal sinuses are largely clear. No significant mastoid
effusion.

Other: None.
IMPRESSION: 1. No acute intracranial abnormality.
2. Multiple scattered foci of T2/FLAIR hyperintensity involving the
supratentorial cerebral white matter, nonspecific, but stable as
compared to recent MRI from [DATE]. Differential considerations
are broad, and include sequelae of accelerated/hereditary chronic
small-vessel ischemia, changes related to prior trauma,
hypercoagulable state, vasculitis, sequelae of complicated
migraines, prior infectious or inflammatory process, or
demyelination.

## 2021-08-22 MED ORDER — DEXAMETHASONE SODIUM PHOSPHATE 10 MG/ML IJ SOLN
10.0000 mg | Freq: Once | INTRAMUSCULAR | Status: AC
  Administered 2021-08-22: 10 mg via INTRAVENOUS
  Filled 2021-08-22: qty 1

## 2021-08-22 NOTE — Consult Note (Signed)
[facility] ?[facility] Consult [facility] ? ?Stat Consult ? ?Patient Name:   Codie, Wernecke ?Date of Birth:   12-29-1990 ?Identification Number:   MRN - [id number] ?Date of Service:   08/22/2021 01:14:45 ? ?Diagnosis: ?      R20.2 - Paresthesia of skin ? ?Impression ?31 year old female who presents with right sided headache and right side paresthesias. Presentation may be due to complex migraine given MRI is negative for new stroke. ? ? ?Recommendations: ?Our recommendations are outlined below. ? ?Medications: ?Continue home Aspirin 81 mg ?Decadron 10mg  one-time dose IV ?Please order ? ?Nursing Recommendations: ?Neuro checks q6 hrs ? ?DVT Prophylaxis: ?Choice of Primary Team ? ?Disposition: ?Neurology will follow ? ?Free Text: ?Consider Benadryl 25 mg PO, Comapzine 10 mg PO, Magnesium 1g IV for continuing headache/migraine. ? ?Follow up with outpatient Neurology ? ? ?---------------------------------------------------------------------------------------------------- ? ? ? ?Metrics: ?[facility] Notification Time: 08/22/2021 01:12:23 ?Stamp Time: 08/22/2021 01:14:45 ?Callback Response Time: 08/22/2021 01:15:36 ? ? ? ?Imaging ?MRI Brain w/o contrast: 1. No acute intracranial abnormality. ?2. Multiple scattered foci of T2/FLAIR hyperintensity involving the ?supratentorial cerebral white matter, nonspecific, but stable as ?compared to recent MRI from 08/07/2021. Differential considerations ?are broad, and include sequelae of accelerated/hereditary chronic ?small-vessel ischemia, changes related to prior trauma, ?hypercoagulable state, vasculitis, sequelae of complicated ?migraines, prior infectious or inflammatory process, or ?demyelination. ? ? ?---------------------------------------------------------------------------------------------------- ? ?Chief Complaint: ?Right side paresthesias ? ?History of Present Illness: ?Patient is a 31 year old Female. ?31 year old female who presents to the  hospital with right side paresthesias and headaches. She recently was being evaluated for left sided numbness and tingling. MRI Brain in April showed extensive cortical white matter disease concerning for possible microvascular disease or cardioembolic strokes. Recent TTE w/ shunt study was positive for PFO. This evening around 5pm she developed right side headache and right side paresthesias. Patient reports a remote history of migraines but nothing significant in the past few years. Patient also reports a history of heavy smoking but quite in 2015. ? ? ?Past Medical History: ?     There is no history of Hypertension ? ?Medications: ? ?No Anticoagulant use  ?Antiplatelet use: Yes Aspirin 81 mg ?Reviewed EMR for current medications ? ?Allergies:  ?Reviewed ? ?Social History: ?Smoking: Former ? ?Family History: ? ?There is no family history of premature cerebrovascular disease pertinent to this consultation ? ?ROS : ?14 Points Review of Systems was performed and was negative except mentioned in HPI. ? ?Past Surgical History: ?There Is No Surgical History Contributory To Today?s Visit ? ? ?Examination: ?BP(110/76), Pulse(80), ?1A: Level of Consciousness - Alert; keenly responsive + 0 ?1B: Ask Month and Age - Both Questions Right + 0 ?1C: Blink Eyes & Squeeze Hands - Performs Both Tasks + 0 ?2: Test Horizontal Extraocular Movements - Normal + 0 ?3: Test Visual Fields - No Visual Loss + 0 ?4: Test Facial Palsy (Use Grimace if Obtunded) - Normal symmetry + 0 ?5A: Test Left Arm Motor Drift - No Drift for 10 Seconds + 0 ?5B: Test Right Arm Motor Drift - No Drift for 10 Seconds + 0 ?6A: Test Left Leg Motor Drift - No Drift for 5 Seconds + 0 ?6B: Test Right Leg Motor Drift - No Drift for 5 Seconds + 0 ?7: Test Limb Ataxia (FNF/Heel-Shin) - No Ataxia + 0 ?8: Test Sensation - Mild-Moderate Loss: Less Sharp/More Dull + 1 ?9: Test Language/Aphasia - Normal; No aphasia + 0 ?10: Test Dysarthria - Normal + 0 ?11:  Test  Extinction/Inattention - No abnormality + 0 ? ?NIHSS Score: 1 ? ? ? ? ?Patient / Family was informed the Neurology Consult would occur via TeleHealth consult by way of interactive audio and video telecommunications and consented to receiving care in this manner. ? ?Patient is being evaluated for possible acute neurologic impairment and high probability of imminent or life - threatening deterioration.I spent total of 35 minutes providing care to this patient, including time for face to face visit via telemedicine, review of medical records, imaging studies and discussion of findings with providers, the patient and / or family. ? ? ?Dr Tsosie Billing ? ? ?[facility] ?[phone] ? ?Case [id number] ? ?

## 2021-08-23 ENCOUNTER — Encounter: Payer: Self-pay | Admitting: Neurology

## 2021-08-23 ENCOUNTER — Other Ambulatory Visit: Payer: Self-pay | Admitting: *Deleted

## 2021-08-23 DIAGNOSIS — R931 Abnormal findings on diagnostic imaging of heart and coronary circulation: Secondary | ICD-10-CM

## 2021-08-28 NOTE — Telephone Encounter (Signed)
I spoke to Ms. Vandevoorde. ? ?We will set up a CT angiogram of the chest to better determine if there is an intrapulmonic shunt. ?

## 2021-08-29 ENCOUNTER — Encounter: Payer: Self-pay | Admitting: Neurology

## 2021-08-29 ENCOUNTER — Other Ambulatory Visit: Payer: Self-pay | Admitting: Neurology

## 2021-08-29 ENCOUNTER — Telehealth: Payer: Self-pay | Admitting: Neurology

## 2021-08-29 MED ORDER — RIZATRIPTAN BENZOATE 5 MG PO TABS
5.0000 mg | ORAL_TABLET | ORAL | 0 refills | Status: DC | PRN
Start: 2021-08-29 — End: 2021-11-07

## 2021-08-29 NOTE — Telephone Encounter (Signed)
[employer] Berkley Harvey: [phone] exp. 08/29/21-09/27/21 sent to GI they will call the patient to schedule ?

## 2021-08-31 ENCOUNTER — Encounter: Payer: Self-pay | Admitting: Neurology

## 2021-08-31 ENCOUNTER — Ambulatory Visit: Payer: BC Managed Care – PPO | Admitting: Neurology

## 2021-08-31 VITALS — BP 116/76 | HR 76 | Ht 67.0 in | Wt 169.5 lb

## 2021-08-31 DIAGNOSIS — R9082 White matter disease, unspecified: Secondary | ICD-10-CM

## 2021-08-31 DIAGNOSIS — R208 Other disturbances of skin sensation: Secondary | ICD-10-CM | POA: Diagnosis not present

## 2021-08-31 DIAGNOSIS — R931 Abnormal findings on diagnostic imaging of heart and coronary circulation: Secondary | ICD-10-CM

## 2021-08-31 DIAGNOSIS — H539 Unspecified visual disturbance: Secondary | ICD-10-CM

## 2021-08-31 MED ORDER — IMIPRAMINE HCL 25 MG PO TABS: 25.0000 mg | ORAL_TABLET | Freq: Every day | ORAL | 3 refills | Status: DC

## 2021-08-31 NOTE — Progress Notes (Addendum)
[facility]  PATIENT: Elizabeth Williamson DOB: 06-12-1990  REFERRING DOCTOR OR PCP:  Ronna Polio, MD SOURCE: Patient, notes from 08/07/2021 emergency room visit, imaging and lab reports, MRI images personally reviewed.  _________________________________   HISTORICAL  CHIEF COMPLAINT:  Chief Complaint  Patient presents with   Follow-up    Rm 2, Husband [name]. Here to f/u for migraine concerns. Migraines started last Monday and have been daily since. Would like to discuss her recent ECHO. C/o of numbness in R lower leg, onset last Monday.     HISTORY OF PRESENT ILLNESS:  Elizabeth Williamson is a 31 y.o. woman with paresthesias and abnormal brain MRI.  Update 08/31/2021: Due to multiple small round subcortical foci, an Echo with bubble contrast was performed.   She was found to have passage of some bubbles 6 beats after injection (most c/w intrapulmonary shunting rather than ASD/PFO)  She went to the ED with chest pain and more left sided numbness 4/30//2023 and 08/21/2021.  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   She has had more headache, left sided and she has a visual aura.    She takes Excedrin and also notes caffeinated beverages help.      She started gabapentin for dysesthesias and it has not helped (300 mg pm and night).     She has no h/o DVT, PE.     Her most consistent issues are her back pain and numbness.  When pain is worse in her LB, she noes nausea.  She has seen orthopedics.   She has sometimes had urinary urgency when LBP was present.    History of Numbness and studies: She has experienced numbness nad tingling in te left side of her body since 2022.  In the more distant past, she would have episodes of numbness and pian in the left arm but not leg like more recently.     Last week, she had more increased pain in her left side and back and difficulty with vision.    She noted blurriness out of her left eye.   She feels these symptoms are the same today as a few days ago when she went to the ED 08/07/2021.    She last saw ophthalmology Decembr 2022.  At that time the left eye was mildly worse than her right eye (we don;t have records).     She went to the ED October 2022 due to LBP and numbness in the left leg.   Imaging was not performed.   She had a prednisone pack and symptoms improved.      She saw [facility] a couple weeks ago and was told no Ortho issue and that she should see Neuology.      She had migraines as a teenager but no typical migraines x many years.   She reports a chemical exposure last year and had headache  Vascular risks:   No cardiac issues except some palpitations,   she does not smoke.  No DM or HTN.  She was a full term twin and was breech and weighted 5  pounds 11 oz   No difficulty with pregnancy or delivery.  She reached all milestones.      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       Imaging: MRI of the brain 08/07/2021 shows numerous T2/FLAIR hyperintense foci in the subcortical and  deep white matter.  A couple foci are juxtacortical.  There are only a couple very small periventricular foci and no infratentorial foci.  None of the foci appear to be acute on diffusion-weighted imaging there was no enhancement.  Fourth ventricle larger than typical but adjacent brain is normal and this is unlikely to be significant.  MRI of the cervical spine 08/07/2021 was normal  Laboratory: CBC and BMP 08/07/2021 were normal 07/11/2020: TB negative, hep B surface antibody positive, varicella IgG positive   REVIEW OF SYSTEMS: Constitutional: No fevers, chills, sweats, or change in appetite Eyes: No visual changes, double vision, eye pain Ear, nose and throat: No hearing loss, ear pain, nasal congestion, sore throat Cardiovascular: No chest pain, palpitations Respiratory:  No shortness of breath at rest or with  exertion.   No wheezes GastrointestinaI: No nausea, vomiting, diarrhea, abdominal pain, fecal incontinence Genitourinary:  No dysuria, urinary retention or frequency.  No nocturia. Musculoskeletal:  No neck pain, back pain Integumentary: No rash, pruritus, skin lesions Neurological: as above Psychiatric: No depression at this time.  No anxiety Endocrine: No palpitations, diaphoresis, change in appetite, change in weigh or increased thirst Hematologic/Lymphatic:  No anemia, purpura, petechiae. Allergic/Immunologic: No itchy/runny eyes, nasal congestion, recent allergic reactions, rashes  ALLERGIES: No Known Allergies  HOME MEDICATIONS:  Current Outpatient Medications:    albuterol (PROAIR HFA) 108 (90 Base) MCG/ACT inhaler, Inhale 1-2 puffs into the lungs every 4 (four) hours as needed., Disp: 18 g, Rfl: 1   cetirizine (ZYRTEC) 10 MG tablet, Take 10 mg by mouth daily., Disp: , Rfl:    citalopram (CELEXA) 20 MG tablet, Take 10 mg by mouth daily., Disp: , Rfl:    gabapentin (NEURONTIN) 300 MG capsule, Take 1 capsule (300 mg total) by mouth 3 (three) times daily., Disp: 90 capsule, Rfl: 5   ibuprofen (ADVIL) 200 MG tablet, Take 600 mg by mouth 2 (two) times daily as needed., Disp: , Rfl:    imipramine (TOFRANIL) 25 MG tablet, Take 1 tablet (25 mg total) by mouth at bedtime., Disp: 30 tablet, Rfl: 3   Multiple Vitamin (MULTIVITAMIN WITH MINERALS) TABS tablet, Take 2 tablets by mouth daily., Disp: , Rfl:    norethindrone (MICRONOR) 0.35 MG tablet, Take 1 tablet by mouth daily., Disp: , Rfl:    rizatriptan (MAXALT) 5 MG tablet, Take 1 tablet (5 mg total) by mouth as needed for migraine. May repeat in 2 hours if needed, Disp: 10 tablet, Rfl: 0  PAST MEDICAL HISTORY: Past Medical History:  Diagnosis Date   Alopecia    got injections through dermatologist   Migraine    rare    PAST SURGICAL HISTORY: Past Surgical History:  Procedure Laterality Date   HERNIA REPAIR  2021    FAMILY  HISTORY: Family History  Problem Relation Age of Onset   Heart disease Brother        recently found to have enlarged heart (age 38)   Diabetes Maternal Grandmother    Cancer Maternal Grandfather        colon cancer (60's)   Hypertension Paternal Grandmother    Cancer Paternal Uncle        lung cancer (nonsmoker)    SOCIAL HISTORY:  Social History   Socioeconomic History   Marital status: Married    Spouse name: Not on file   Number of children: Not on file   Years of education: Not on file   Highest education level: Not on file  Occupational History  Occupation: Psychologist, sport and exercisenurse tech at [employer]    Employer: [employer]  Tobacco Use   Smoking status: Former    Packs/day: 0.50    Years: 3.00    Pack years: 1.50    Types: Cigarettes    Quit date: 08/14/2013    Years since quitting: 8.0   Smokeless tobacco: Never   Tobacco comments:    increased to 3 pack/week  Vaping Use   Vaping Use: Never used  Substance and Sexual Activity   Alcohol use: Yes    Comment: Occasional social drinker, every other weekend   Drug use: No   Sexual activity: Yes    Partners: Male    Birth control/protection: None  Other Topics Concern   Not on file  Social History Narrative   Right handed   Coffee- 12 oz per day   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 No pets. 1 son (Dexton) born 10/12/15.  No passive tobacco exposure.    Social Determinants of Health   Financial Resource Strain: Not on file  Food Insecurity: Not on file  Transportation Needs: Not on file  Physical Activity: Not on file  Stress: Not on file  Social Connections: Not on file  Intimate Partner Violence: Not on file     PHYSICAL EXAM  Vitals:   08/31/21 0940  BP: 116/76  Pulse: 76  Weight: 169 lb 8 oz (76.9 kg)  Height: 5\' 7"  (1.702 m)    Body mass index is 26.55 kg/m.   General: The patient is well-developed and well-nourished and in no acute distress  HEENT:  Head is [place]/AT.  Sclera are  anicteric.    Skin: Extremities are without rash or  edema.  Musculoskeletal:  Back is nontender  Neurologic Exam  Mental status: The patient is alert and oriented x 3 at the time of the examination. The patient has apparent normal recent and remote memory, with an apparently normal attention span and concentration ability.   Speech is normal.  Cranial nerves: Extraocular movements are full. Pupils are equal, round, and reactive to light and accomodation. Facial stregnth and snsation were fine.   No obvious hearing deficits are noted.  Motor:  Muscle bulk is normal.   Tone is normal. Strength is  5 / 5 in all 4 extremities.   Sensory: Sensory testing today showed mild asymmetry of touch (more tingly on left arm and leg than right) and vibration (felt slightly more left leg than right)  Coordination: Cerebellar testing reveals good finger-nose-finger and heel-to-shin bilaterally.  Gait and station: Station is normal.   Gait is normal. Tandem gait is normal. Romberg is negative.   Reflexes: Deep tendon reflexes are symmetric and normal bilaterally.       DIAGNOSTIC DATA (LABS, IMAGING, TESTING) - I reviewed patient records, labs, notes, testing and imaging myself where available.  Lab Results  Component Value Date   WBC 7.7 08/21/2021   HGB 11.9 (L) 08/21/2021   HCT 38.4 08/21/2021   MCV 88.1 08/21/2021   PLT 221 08/21/2021      Component Value Date/Time   NA 137 08/21/2021 1837   NA 142 12/31/2018 1632   K 3.5 08/21/2021 1837   CL 106 08/21/2021 1837   CO2 24 08/21/2021 1837   GLUCOSE 102 (H) 08/21/2021 1837   BUN 10 08/21/2021 1837   BUN 10 12/31/2018 1632   CREATININE 0.66 08/21/2021 1837   CREATININE 0.58 07/19/2014 0001   CALCIUM 9.6 08/21/2021 1837  PROT 6.8 09/27/2017 0916   ALBUMIN 4.4 09/27/2017 0916   AST 14 09/27/2017 0916   ALT 12 09/27/2017 0916   ALKPHOS 61 09/27/2017 0916   BILITOT 0.2 09/27/2017 0916   GFRNONAA >60 08/21/2021 1837   GFRAA >60  08/21/2019 1611   Lab Results  Component Value Date   CHOL 196 09/23/2019   HDL 107 09/23/2019   LDLCALC 81 09/23/2019   TRIG 36 09/23/2019   CHOLHDL 1.8 09/23/2019   Lab Results  Component Value Date   HGBA1C 5.2 12/31/2018   Lab Results  Component Value Date   VITAMINB12 638 08/09/2021   Lab Results  Component Value Date   TSH 0.659 08/13/2021       ASSESSMENT AND PLAN  White matter abnormality on MRI of brain  Abnormal echocardiogram  Dysesthesia  Vision changes   Echocardiogram with bubble contrast showed passage of bubbles at greater than 6 beats.  This could be seen with intrapulmonary shunting.  A CT angiogram of the chest has been ordered for further characterization.  Also, due to her chest pain she had been referred to cardiology and will see them in the next few days. For the migraines, I will add imipramine.  This may also help sleep.  If this is not beneficial consider topiramate though I would be concerned that this may make her tingling worse Return in 5 months or sooner if there are new or worsening neurologic symptoms or based on results of further studies.  40-minute office visit with the majority of the time spent face-to-face for history and physical, discussion/counseling and decision-making.  Additional time with record review and documentation.   Tymir Terral A. Epimenio Foot, MD, [facility] 08/31/2021, 7:41 PM Certified in Neurology, Clinical Neurophysiology, Sleep Medicine and Neuroimaging  [facility] [address], [address] [place], [place] [address] [phone]

## 2021-09-05 NOTE — H&P (View-Only) (Signed)
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 female with a hx of 2 emergency room visits for chest pain the first on 08/13/2021 and a subsequent visit on 08/21/2021 which identified neurological abnormality and raise the question of intracardiac shunting with embolic stroke.  The most recent episode is summarized as follows: "Differential diagnosis includes, but is not limited to complex migraine, CVA, metabolic derangement, anemia.  While symptoms are possibly suggestive of demyelinating disorder such as MS it seems this was considered by her neurologist recently felt this was less likely based on her MRI findings and was more concerned about possible embolic brain insult.   With regards her chest pain differential considerations include ACS, pneumonia, PE, pneumothorax, arrhythmia, anemia and metabolic derangements as well.  Overall symptoms are not suggestive of dissection at this time.  He has no findings on exam to suggest a DVT.     ECG is remarkable sinus rhythm with a ventricular rate of 85, normal axis, unremarkable intervals without evidence of acute ischemia or significant arrhythmia.  Troponins x2 are not elevated and below suspicion for ACS or myocarditis.  D-dimer is less than 0.5 and have low suspicion for PE.  BMP shows no significant electrolyte or metabolic derangements.  CBC shows no significant leukocytosis or acute anemia.  hCG is negative.   CT head on my review shows no evidence of hemorrhage, edema, acute ischemia or mass effect.  I also reviewed radiology's findings and agree with their interpretation of  multiple bilateral hypodense white matter lesions similar to prior MRI from April.   Chest reviewed by myself shows no focal consoidation, effusion, edema, pneumothorax or other clear acute thoracic process. I also reviewed radiology interpretation and agree with findings described.   MR brain on my interpretation without evidence of an acute CVA hemorrhage edema or other clear acute process.  I reviewed radiology interpretation and agree with their findings of multiple scattered hyperintensity foci stable when compared to 4/24 without other clear acute process.   Overall I have very low suspicion for CVA or immediate life-threatening process.  I did consult with teleneurologist to evaluate patient over video and recommended treating patient for possible atypical migraine I recommended no additional emergent work-up or admission and the patient would benefit from further outpatient work-up with her neurologist including possible nonemergent LP.  I discussed with patient.  She is amenable to trying some steroids otherwise deferring any Compazine at this time.  He was given some Decadron.  Discharged stable condition.  Strict return precautions advised and discussed."    Complaints on 2 occasions have been related to left-sided chest discomfort with impending/progressive migraine headache.  She is here today to be evaluated.  We discussed the possibility of a undiagnosed patent foramen ovale despite structurally normal heart by transthoracic echo.  This is an important to rule out especially given the implication..  The transthoracic echo did not demonstrate structural abnormality but did have evidence of a positive bubble study but with timing suggesting intrapulmonary shunting (6 cardiac cycles before microcavitation is appeared  in the left atrium).  She continues to have various neurological complaints.  Initially, it was only left-sided numbness.  Subsequent to the initial episode a month ago she has  now developed right leg numbness.  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   cetirizine (ZYRTEC) 10 MG tablet Take 10 mg by mouth daily.   cholecalciferol (VITAMIN D3) 25 MCG (1000 UNIT) tablet Take 1 tablet by mouth daily.   citalopram (CELEXA) 20 MG tablet Take 10 mg by mouth daily.   etonogestrel (NEXPLANON) 68 MG IMPL implant Inject into the skin as directed.   ibuprofen (ADVIL) 200 MG tablet Take 600 mg by mouth 2 (two) times daily as needed.   LORazepam (ATIVAN) 1 MG tablet Take 1 tablet by mouth as needed.   Multiple Vitamin (MULTIVITAMIN WITH MINERALS) TABS tablet Take 2 tablets by mouth daily.   ondansetron (ZOFRAN-ODT) 4 MG disintegrating tablet Take 1 tablet by mouth as needed.   rizatriptan (MAXALT) 5 MG tablet Take 1 tablet (5 mg total) by mouth as needed for migraine. May repeat in 2 hours if needed     Allergies:   Patient has no known allergies.   Social History   Socioeconomic History   Marital status: Married    Spouse name: Not on file   Number of children: Not on file   Years of education: Not on file   Highest education level: Not on file  Occupational History   Occupation: Psychologist, sport and exercise at [employer]: [employer]  Tobacco Use   Smoking status: Former    Packs/day: 0.50    Years: 3.00    Pack years: 1.50    Types: Cigarettes    Quit date: 08/14/2013    Years since quitting: 8.0   Smokeless tobacco: Never   Tobacco comments:    increased to 3 pack/week  Vaping Use   Vaping Use: Never used  Substance and Sexual Activity   Alcohol use: Yes    Comment: Occasional social drinker, every other weekend   Drug use: No   Sexual activity: Yes    Partners: Male    Birth  control/protection: None  Other Topics Concern   Not on file  Social History Narrative   Right handed   Coffee- 12 oz per day   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 No pets. 1 son (Dexton) born 10/12/15.  No passive tobacco exposure.    Social Determinants of Health   Financial Resource Strain: Not on file  Food Insecurity: Not on file  Transportation Needs: Not on file  Physical Activity: Not on file  Stress: Not on file  Social Connections: Not on file     Family History: The patient's family history includes Cancer in her maternal grandfather and paternal uncle; Diabetes in her maternal grandmother; Heart disease in her brother; Hypertension in her paternal grandmother.  ROS:   Please see the history of present illness.    No motor abnormalities noted.  She is a Radiation protection practitioner.  She works at [employer].  All other systems reviewed and are negative.  EKGs/Labs/Other Studies Reviewed:  The following studies were reviewed today:  2D Doppler echocardiogram Aug 18, 2021: IMPRESSIONS   1. Left ventricular ejection fraction, by estimation, is 60 to 65%. The  left ventricle has normal function. The left ventricle has no regional  wall motion abnormalities. Left ventricular diastolic parameters were  normal.   2. Right ventricular systolic function is normal. The right ventricular  size is normal.   3. The mitral valve is normal in structure. No evidence of mitral valve  regurgitation. No evidence of mitral stenosis.   4. The aortic valve is tricuspid. Aortic valve regurgitation is not  visualized. No aortic stenosis is present.   5. Agitated saline contrast bubble study was positive with shunting  observed after >6 cardiac cycles suggestive of intrapulmonary shunting.   Comparison(s): No prior Echocardiogram.    Head MRI may ninth 2023: IMPRESSION: 1. No acute intracranial abnormality. 2. Multiple scattered foci of T2/FLAIR  hyperintensity involving the supratentorial cerebral white matter, nonspecific, but stable as compared to recent MRI from 08/07/2021. Differential considerations are broad, and include sequelae of accelerated/hereditary chronic small-vessel ischemia, changes related to prior trauma, hypercoagulable state, vasculitis, sequelae of complicated migraines, prior infectious or inflammatory process, or demyelination.    EKG:  EKG performed on 08/22/2021 demonstrates normal sinus rhythm at 85 bpm and no morphological abnormalities.  Recent Labs: 08/13/2021: TSH 0.659 08/21/2021: BUN 10; Creatinine, Ser 0.66; Hemoglobin 11.9; Platelets 221; Potassium 3.5; Sodium 137  Recent Lipid Panel    Component Value Date/Time   CHOL 196 09/23/2019 0000   TRIG 36 09/23/2019 0000   HDL 107 09/23/2019 0000   CHOLHDL 1.8 09/23/2019 0000   CHOLHDL 2.3 03/06/2012 1535   VLDL 9 03/06/2012 1535   LDLCALC 81 09/23/2019 0000    Physical Exam:    VS:  BP 102/62   Pulse 78   Ht 5\' 7"  (1.702 m)   Wt 169 lb (76.7 kg)   LMP  (LMP Unknown)   SpO2 99%   BMI 26.47 kg/m     Wt Readings from Last 3 Encounters:  09/06/21 169 lb (76.7 kg)  08/31/21 169 lb 8 oz (76.9 kg)  08/21/21 169 lb (76.7 kg)     GEN: Young healthy appearing. No acute distress HEENT: Normal NECK: No JVD. LYMPHATICS: No lymphadenopathy CARDIAC: No murmur. RRR no gallop, or edema. VASCULAR: No normal Pulses. No bruits. RESPIRATORY:  Clear to auscultation without rales, wheezing or rhonchi  ABDOMEN: Soft, non-tender, non-distended, No pulsatile mass, MUSCULOSKELETAL: No deformity  SKIN: Warm and dry NEUROLOGIC:  Alert and oriented x 3 PSYCHIATRIC:  Normal affect   ASSESSMENT:    1. Chest pain of uncertain etiology   2. Patent foramen ovale with right to left shunt   3. Hx of migraine headaches   4. 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 Pre-procedure lab exam     PLAN:    In order of problems listed above:  Not felt to be ischemic.  Transthoracic echo demonstrates normal LV function. Transthoracic echo did not identify a patent foramen ovale, but there was demonstration of right to left shunting after 6 cardiac cycles.  We will plan to perform transesophageal echo to be certain that there is not patent foraminal ovale that could be associated with recurrent migraine headaches and evidence of embolic strokes as noted by neurology on the MRI.  I do not believe that she needs an ischemic evaluation or cardiac MRI. Lifelong history of  migraines Abnormal MRI  I have recommended transesophageal echocardiography to fully define whether or not there is an intracardiac shunt.  This could change her management strategy.  We discussed the procedure and attendant risk including mechanical injury, anesthesia related complications, and other.  She has decided to proceed with imaging.   Medication Adjustments/Labs and Tests Ordered: Current medicines are reviewed at length with the patient today.  Concerns regarding medicines are outlined above.  Orders Placed This Encounter  Procedures   CBC   Basic metabolic panel   No orders of the defined types were placed in this encounter.   Patient Instructions  Medication Instructions:  Your physician recommends that you continue on your current medications as directed. Please refer to the Current Medication list given to you today.  *If you need a refill on your cardiac medications before your next appointment, please call your pharmacy*  Lab Work: Will need CBC, BMET 1 week prior to TEE If you have labs (blood work) drawn today and your tests are completely normal, you will receive your results only by: MyChart Message (if you have MyChart) OR A paper copy in the mail If you have any lab test that is abnormal or we need to change your treatment, we will call you to review the  results.  Testing/Procedures: Your physician has requested that you have a TEE. During a TEE, sound waves are used to create images of your heart. It provides your doctor with information about the size and shape of your heart and how well your heart's chambers and valves are working. In this test, a transducer is attached to the end of a flexible tube that's guided down your throat and into your esophagus (the tube leading from you mouth to your stomach) to get a more detailed image of your heart. You are not awake for the procedure. Please see the instruction sheet given to you today. For further information please visit [email or web address]  Danford Bad will call you with a date and time and instructions for the TEE and lab work.  Follow-Up: At [facility], you and your health needs are our priority.  As part of our continuing mission to provide you with exceptional heart care, we have created designated Provider Care Teams.  These Care Teams include your primary Cardiologist (physician) and Advanced Practice Providers (APPs -  Physician Assistants and Nurse Practitioners) who all work together to provide you with the care you need, when you need it.  Your next appointment:   As needed  The format for your next appointment:   In Person  Provider:   Lesleigh Noe, MD {    Important Information About Sugar         Signed, Elizabeth Noe, MD  09/06/2021 5:42 PM    [facility]

## 2021-09-05 NOTE — Progress Notes (Unsigned)
Cardiology Office Note:    Date:  09/06/2021   ID:  Elizabeth Williamson, DOB 11/27/1990, MRN [id number]  PCP:  Pcp, No  Cardiologist:  Elizabeth Schmid W Amalia Edgecombe III, MD   Referring MD: Penumalli, Vikram R, MD   Chief Complaint  Patient presents with   Advice Only    Migraine headaches Embolic strokes Right to left shunt on transthoracic echo bubble study    History of Present Illness:    Elizabeth Williamson is a 31 y.o. female with a hx of 2 emergency room visits for chest pain the first on 08/13/2021 and a subsequent visit on 08/21/2021 which identified neurological abnormality and raise the question of intracardiac shunting with embolic stroke.  The most recent episode is summarized as follows: "Differential diagnosis includes, but is not limited to complex migraine, CVA, metabolic derangement, anemia.  While symptoms are possibly suggestive of demyelinating disorder such as MS it seems this was considered by her neurologist recently felt this was less likely based on her MRI findings and was more concerned about possible embolic brain insult.   With regards her chest pain differential considerations include ACS, pneumonia, PE, pneumothorax, arrhythmia, anemia and metabolic derangements as well.  Overall symptoms are not suggestive of dissection at this time.  He has no findings on exam to suggest a DVT.     ECG is remarkable sinus rhythm with a ventricular rate of 85, normal axis, unremarkable intervals without evidence of acute ischemia or significant arrhythmia.  Troponins x2 are not elevated and below suspicion for ACS or myocarditis.  D-dimer is less than 0.5 and have low suspicion for PE.  BMP shows no significant electrolyte or metabolic derangements.  CBC shows no significant leukocytosis or acute anemia.  hCG is negative.   CT head on my review shows no evidence of hemorrhage, edema, acute ischemia or mass effect.  I also reviewed radiology's findings and agree with their interpretation of  multiple bilateral hypodense white matter lesions similar to prior MRI from April.   Chest reviewed by myself shows no focal consoidation, effusion, edema, pneumothorax or other clear acute thoracic process. I also reviewed radiology interpretation and agree with findings described.   MR brain on my interpretation without evidence of an acute CVA hemorrhage edema or other clear acute process.  I reviewed radiology interpretation and agree with their findings of multiple scattered hyperintensity foci stable when compared to 4/24 without other clear acute process.   Overall I have very low suspicion for CVA or immediate life-threatening process.  I did consult with teleneurologist to evaluate patient over video and recommended treating patient for possible atypical migraine I recommended no additional emergent work-up or admission and the patient would benefit from further outpatient work-up with her neurologist including possible nonemergent LP.  I discussed with patient.  She is amenable to trying some steroids otherwise deferring any Compazine at this time.  He was given some Decadron.  Discharged stable condition.  Strict return precautions advised and discussed."    Complaints on 2 occasions have been related to left-sided chest discomfort with impending/progressive migraine headache.  She is here today to be evaluated.  We discussed the possibility of a undiagnosed patent foramen ovale despite structurally normal heart by transthoracic echo.  This is an important to rule out especially given the implication..  The transthoracic echo did not demonstrate structural abnormality but did have evidence of a positive bubble study but with timing suggesting intrapulmonary shunting (6 cardiac cycles before microcavitation is appeared   in the left atrium).  She continues to have various neurological complaints.  Initially, it was only left-sided numbness.  Subsequent to the initial episode a month ago she has  now developed right leg numbness.  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   cetirizine (ZYRTEC) 10 MG tablet Take 10 mg by mouth daily.   cholecalciferol (VITAMIN D3) 25 MCG (1000 UNIT) tablet Take 1 tablet by mouth daily.   citalopram (CELEXA) 20 MG tablet Take 10 mg by mouth daily.   etonogestrel (NEXPLANON) 68 MG IMPL implant Inject into the skin as directed.   ibuprofen (ADVIL) 200 MG tablet Take 600 mg by mouth 2 (two) times daily as needed.   LORazepam (ATIVAN) 1 MG tablet Take 1 tablet by mouth as needed.   Multiple Vitamin (MULTIVITAMIN WITH MINERALS) TABS tablet Take 2 tablets by mouth daily.   ondansetron (ZOFRAN-ODT) 4 MG disintegrating tablet Take 1 tablet by mouth as needed.   rizatriptan (MAXALT) 5 MG tablet Take 1 tablet (5 mg total) by mouth as needed for migraine. May repeat in 2 hours if needed     Allergies:   Patient has no known allergies.   Social History   Socioeconomic History   Marital status: Married    Spouse name: Not on file   Number of children: Not on file   Years of education: Not on file   Highest education level: Not on file  Occupational History   Occupation: Psychologist, sport and exercise at [employer]: [employer]  Tobacco Use   Smoking status: Former    Packs/day: 0.50    Years: 3.00    Pack years: 1.50    Types: Cigarettes    Quit date: 08/14/2013    Years since quitting: 8.0   Smokeless tobacco: Never   Tobacco comments:    increased to 3 pack/week  Vaping Use   Vaping Use: Never used  Substance and Sexual Activity   Alcohol use: Yes    Comment: Occasional social drinker, every other weekend   Drug use: No   Sexual activity: Yes    Partners: Male    Birth  control/protection: None  Other Topics Concern   Not on file  Social History Narrative   Right handed   Coffee- 12 oz per day   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 No pets. 1 son (Dexton) born 10/12/15.  No passive tobacco exposure.    Social Determinants of Health   Financial Resource Strain: Not on file  Food Insecurity: Not on file  Transportation Needs: Not on file  Physical Activity: Not on file  Stress: Not on file  Social Connections: Not on file     Family History: The patient's family history includes Cancer in her maternal grandfather and paternal uncle; Diabetes in her maternal grandmother; Heart disease in her brother; Hypertension in her paternal grandmother.  ROS:   Please see the history of present illness.    No motor abnormalities noted.  She is a Radiation protection practitioner.  She works at [employer].  All other systems reviewed and are negative.  EKGs/Labs/Other Studies Reviewed:  The following studies were reviewed today:  2D Doppler echocardiogram Aug 18, 2021: IMPRESSIONS   1. Left ventricular ejection fraction, by estimation, is 60 to 65%. The  left ventricle has normal function. The left ventricle has no regional  wall motion abnormalities. Left ventricular diastolic parameters were  normal.   2. Right ventricular systolic function is normal. The right ventricular  size is normal.   3. The mitral valve is normal in structure. No evidence of mitral valve  regurgitation. No evidence of mitral stenosis.   4. The aortic valve is tricuspid. Aortic valve regurgitation is not  visualized. No aortic stenosis is present.   5. Agitated saline contrast bubble study was positive with shunting  observed after >6 cardiac cycles suggestive of intrapulmonary shunting.   Comparison(s): No prior Echocardiogram.    Head MRI may ninth 2023: IMPRESSION: 1. No acute intracranial abnormality. 2. Multiple scattered foci of T2/FLAIR  hyperintensity involving the supratentorial cerebral white matter, nonspecific, but stable as compared to recent MRI from 08/07/2021. Differential considerations are broad, and include sequelae of accelerated/hereditary chronic small-vessel ischemia, changes related to prior trauma, hypercoagulable state, vasculitis, sequelae of complicated migraines, prior infectious or inflammatory process, or demyelination.    EKG:  EKG performed on 08/22/2021 demonstrates normal sinus rhythm at 85 bpm and no morphological abnormalities.  Recent Labs: 08/13/2021: TSH 0.659 08/21/2021: BUN 10; Creatinine, Ser 0.66; Hemoglobin 11.9; Platelets 221; Potassium 3.5; Sodium 137  Recent Lipid Panel    Component Value Date/Time   CHOL 196 09/23/2019 0000   TRIG 36 09/23/2019 0000   HDL 107 09/23/2019 0000   CHOLHDL 1.8 09/23/2019 0000   CHOLHDL 2.3 03/06/2012 1535   VLDL 9 03/06/2012 1535   LDLCALC 81 09/23/2019 0000    Physical Exam:    VS:  BP 102/62   Pulse 78   Ht 5\' 7"  (1.702 m)   Wt 169 lb (76.7 kg)   LMP  (LMP Unknown)   SpO2 99%   BMI 26.47 kg/m     Wt Readings from Last 3 Encounters:  09/06/21 169 lb (76.7 kg)  08/31/21 169 lb 8 oz (76.9 kg)  08/21/21 169 lb (76.7 kg)     GEN: Young healthy appearing. No acute distress HEENT: Normal NECK: No JVD. LYMPHATICS: No lymphadenopathy CARDIAC: No murmur. RRR no gallop, or edema. VASCULAR: No normal Pulses. No bruits. RESPIRATORY:  Clear to auscultation without rales, wheezing or rhonchi  ABDOMEN: Soft, non-tender, non-distended, No pulsatile mass, MUSCULOSKELETAL: No deformity  SKIN: Warm and dry NEUROLOGIC:  Alert and oriented x 3 PSYCHIATRIC:  Normal affect   ASSESSMENT:    1. Chest pain of uncertain etiology   2. Patent foramen ovale with right to left shunt   3. Hx of migraine headaches   4. 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 Pre-procedure lab exam     PLAN:    In order of problems listed above:  Not felt to be ischemic.  Transthoracic echo demonstrates normal LV function. Transthoracic echo did not identify a patent foramen ovale, but there was demonstration of right to left shunting after 6 cardiac cycles.  We will plan to perform transesophageal echo to be certain that there is not patent foraminal ovale that could be associated with recurrent migraine headaches and evidence of embolic strokes as noted by neurology on the MRI.  I do not believe that she needs an ischemic evaluation or cardiac MRI. Lifelong history of  migraines Abnormal MRI  I have recommended transesophageal echocardiography to fully define whether or not there is an intracardiac shunt.  This could change her management strategy.  We discussed the procedure and attendant risk including mechanical injury, anesthesia related complications, and other.  She has decided to proceed with imaging.   Medication Adjustments/Labs and Tests Ordered: Current medicines are reviewed at length with the patient today.  Concerns regarding medicines are outlined above.  Orders Placed This Encounter  Procedures   CBC   Basic metabolic panel   No orders of the defined types were placed in this encounter.   Patient Instructions  Medication Instructions:  Your physician recommends that you continue on your current medications as directed. Please refer to the Current Medication list given to you today.  *If you need a refill on your cardiac medications before your next appointment, please call your pharmacy*  Lab Work: Will need CBC, BMET 1 week prior to TEE If you have labs (blood work) drawn today and your tests are completely normal, you will receive your results only by: MyChart Message (if you have MyChart) OR A paper copy in the mail If you have any lab test that is abnormal or we need to change your treatment, we will call you to review the  results.  Testing/Procedures: Your physician has requested that you have a TEE. During a TEE, sound waves are used to create images of your heart. It provides your doctor with information about the size and shape of your heart and how well your heart's chambers and valves are working. In this test, a transducer is attached to the end of a flexible tube that's guided down your throat and into your esophagus (the tube leading from you mouth to your stomach) to get a more detailed image of your heart. You are not awake for the procedure. Please see the instruction sheet given to you today. For further information please visit [email or web address]  Danford Bad will call you with a date and time and instructions for the TEE and lab work.  Follow-Up: At [facility], you and your health needs are our priority.  As part of our continuing mission to provide you with exceptional heart care, we have created designated Provider Care Teams.  These Care Teams include your primary Cardiologist (physician) and Advanced Practice Providers (APPs -  Physician Assistants and Nurse Practitioners) who all work together to provide you with the care you need, when you need it.  Your next appointment:   As needed  The format for your next appointment:   In Person  Provider:   Lesleigh Noe, MD {    Important Information About Sugar         Signed, Lesleigh Noe, MD  09/06/2021 5:42 PM    [facility]

## 2021-09-06 ENCOUNTER — Encounter: Payer: Self-pay | Admitting: Interventional Cardiology

## 2021-09-06 ENCOUNTER — Ambulatory Visit (INDEPENDENT_AMBULATORY_CARE_PROVIDER_SITE_OTHER): Payer: BC Managed Care – PPO | Admitting: Interventional Cardiology

## 2021-09-06 VITALS — BP 102/62 | HR 78 | Ht 67.0 in | Wt 169.0 lb

## 2021-09-06 DIAGNOSIS — R079 Chest pain, unspecified: Secondary | ICD-10-CM

## 2021-09-06 DIAGNOSIS — Q2112 Patent foramen ovale: Secondary | ICD-10-CM

## 2021-09-06 DIAGNOSIS — Z8669 Personal history of other diseases of the nervous system and sense organs: Secondary | ICD-10-CM | POA: Diagnosis not present

## 2021-09-06 DIAGNOSIS — Z01812 Encounter for preprocedural laboratory examination: Secondary | ICD-10-CM | POA: Diagnosis not present

## 2021-09-06 DIAGNOSIS — I63139 Cerebral infarction due to embolism of unspecified carotid artery: Secondary | ICD-10-CM

## 2021-09-06 NOTE — Patient Instructions (Signed)
Medication Instructions:  Your physician recommends that you continue on your current medications as directed. Please refer to the Current Medication list given to you today.  *If you need a refill on your cardiac medications before your next appointment, please call your pharmacy*  Lab Work: Will need CBC, BMET 1 week prior to TEE If you have labs (blood work) drawn today and your tests are completely normal, you will receive your results only by: [place] (if you have MyChart) OR A paper copy in the mail If you have any lab test that is abnormal or we need to change your treatment, we will call you to review the results.  Testing/Procedures: Your physician has requested that you have a TEE. During a TEE, sound waves are used to create images of your heart. It provides your doctor with information about the size and shape of your heart and how well your heart's chambers and valves are working. In this test, a transducer is attached to the end of a flexible tube that's guided down your throat and into your esophagus (the tube leading from you mouth to your stomach) to get a more detailed image of your heart. You are not awake for the procedure. Please see the instruction sheet given to you today. For further information please visit [email or web address].  Drue Dun will call you with a date and time and instructions for the TEE and lab work.  Follow-Up: At [facility], you and your health needs are our priority.  As part of our continuing mission to provide you with exceptional heart care, we have created designated Provider Care Teams.  These Care Teams include your primary Cardiologist (physician) and Advanced Practice Providers (APPs -  Physician Assistants and Nurse Practitioners) who all work together to provide you with the care you need, when you need it.  Your next appointment:   As needed  The format for your next appointment:   In Person  Provider:   Sinclair Grooms, MD  {    Important Information About Sugar

## 2021-09-12 ENCOUNTER — Other Ambulatory Visit: Payer: BC Managed Care – PPO

## 2021-09-14 ENCOUNTER — Other Ambulatory Visit: Payer: Self-pay | Admitting: Interventional Cardiology

## 2021-09-15 ENCOUNTER — Encounter (HOSPITAL_COMMUNITY): Payer: Self-pay | Admitting: Cardiology

## 2021-09-18 ENCOUNTER — Other Ambulatory Visit: Payer: Self-pay | Admitting: Interventional Cardiology

## 2021-09-18 NOTE — Progress Notes (Signed)
Attempted to obtain medical history via telephone, unable to reach at this time. HIPAA compliant voicemail message left requesting return call to pre surgical testing department. 

## 2021-09-19 ENCOUNTER — Other Ambulatory Visit: Payer: BC Managed Care – PPO

## 2021-09-26 ENCOUNTER — Ambulatory Visit (HOSPITAL_COMMUNITY): Payer: BC Managed Care – PPO | Admitting: Anesthesiology

## 2021-09-26 ENCOUNTER — Other Ambulatory Visit: Payer: BC Managed Care – PPO

## 2021-09-26 ENCOUNTER — Other Ambulatory Visit: Payer: Self-pay

## 2021-09-26 ENCOUNTER — Encounter (HOSPITAL_COMMUNITY)
Admission: RE | Disposition: A | Discharge status: Home / Self Care | Payer: Self-pay | Source: Home / Self Care | Attending: Cardiology

## 2021-09-26 ENCOUNTER — Encounter (HOSPITAL_COMMUNITY): Payer: Self-pay | Admitting: Cardiology

## 2021-09-26 ENCOUNTER — Ambulatory Visit (HOSPITAL_COMMUNITY)
Admission: RE | Admit: 2021-09-26 | Discharge: 2021-09-26 | Disposition: A | Discharge status: Home / Self Care | Payer: BC Managed Care – PPO | Attending: Cardiology | Admitting: Cardiology

## 2021-09-26 ENCOUNTER — Ambulatory Visit (HOSPITAL_BASED_OUTPATIENT_CLINIC_OR_DEPARTMENT_OTHER)
Admission: RE | Admit: 2021-09-26 | Discharge: 2021-09-26 | Disposition: A | Discharge status: Home / Self Care | Payer: BC Managed Care – PPO | Source: Ambulatory Visit | Attending: Interventional Cardiology | Admitting: Interventional Cardiology

## 2021-09-26 DIAGNOSIS — R079 Chest pain, unspecified: Secondary | ICD-10-CM | POA: Insufficient documentation

## 2021-09-26 DIAGNOSIS — I63439 Cerebral infarction due to embolism of unspecified posterior cerebral artery: Secondary | ICD-10-CM | POA: Diagnosis present

## 2021-09-26 DIAGNOSIS — I639 Cerebral infarction, unspecified: Secondary | ICD-10-CM | POA: Diagnosis not present

## 2021-09-26 DIAGNOSIS — G43909 Migraine, unspecified, not intractable, without status migrainosus: Secondary | ICD-10-CM | POA: Diagnosis not present

## 2021-09-26 HISTORY — PX: BUBBLE STUDY: SHX6837

## 2021-09-26 HISTORY — PX: TEE WITHOUT CARDIOVERSION: SHX5443

## 2021-09-26 LAB — COMPREHENSIVE METABOLIC PANEL
ALT: 12 U/L (ref 0–44)
AST: 23 U/L (ref 15–41)
Albumin: 3.9 g/dL (ref 3.5–5.0)
Alkaline Phosphatase: 43 U/L (ref 38–126)
Anion gap: 5 (ref 5–15)
BUN: 10 mg/dL (ref 6–20)
CO2: 24 mmol/L (ref 22–32)
Calcium: 9.2 mg/dL (ref 8.9–10.3)
Chloride: 108 mmol/L (ref 98–111)
Creatinine, Ser: 0.69 mg/dL (ref 0.44–1.00)
GFR, Estimated: >60 mL/min (ref >60)
Glucose, Bld: 97 mg/dL (ref 70–99)
Potassium: 4.5 mmol/L (ref 3.5–5.1)
Sodium: 137 mmol/L (ref 135–145)
Total Bilirubin: 0.9 mg/dL (ref 0.3–1.2)
Total Protein: 6.7 g/dL (ref 6.5–8.1)

## 2021-09-26 LAB — CBC
HCT: 36.8 % (ref 36.0–46.0)
Hemoglobin: 11.6 g/dL — ABNORMAL LOW (ref 12.0–15.0)
MCH: 28.2 pg (ref 26.0–34.0)
MCHC: 31.5 g/dL (ref 30.0–36.0)
MCV: 89.5 fL (ref 80.0–100.0)
Platelets: 191 10*3/uL (ref 150–400)
RBC: 4.11 MIL/uL (ref 3.87–5.11)
RDW: 12.4 % (ref 11.5–15.5)
WBC: 4 10*3/uL (ref 4.0–10.5)
nRBC: 0 % (ref 0.0–0.2)

## 2021-09-26 SURGERY — ECHOCARDIOGRAM, TRANSESOPHAGEAL
Anesthesia: Monitor Anesthesia Care

## 2021-09-26 MED ORDER — PROPOFOL 500 MG/50ML IV EMUL
INTRAVENOUS | Status: DC | PRN
  Administered 2021-09-26: 200 ug/kg/min via INTRAVENOUS

## 2021-09-26 MED ORDER — LACTATED RINGERS IV SOLN: INTRAVENOUS | Status: DC | PRN

## 2021-09-26 MED ORDER — SODIUM CHLORIDE 0.9 % IV SOLN: INTRAVENOUS | Status: DC

## 2021-09-26 MED ORDER — PROPOFOL 10 MG/ML IV BOLUS
INTRAVENOUS | Status: DC | PRN
  Administered 2021-09-26: 30 mg via INTRAVENOUS
  Administered 2021-09-26: 20 mg via INTRAVENOUS
  Administered 2021-09-26: 30 mg via INTRAVENOUS

## 2021-09-26 NOTE — Transfer of Care (Signed)
Immediate Anesthesia Transfer of Care Note  Patient: Elizabeth Williamson  Procedure(s) Performed: TRANSESOPHAGEAL ECHOCARDIOGRAM (TEE) BUBBLE STUDY  Patient Location: PACU  Anesthesia Type:MAC  Level of Consciousness: drowsy  Airway & Oxygen Therapy: Patient Spontanous Breathing and Patient connected to nasal cannula oxygen  Post-op Assessment: Report given to RN and Post -op Vital signs reviewed and stable  Post vital signs: Reviewed and stable  Last Vitals:  Vitals Value Taken Time  BP 94/64 09/26/21 0936  Temp    Pulse 70 09/26/21 0937  Resp 16 09/26/21 0937  SpO2 97 % 09/26/21 0937  Vitals shown include unvalidated device data.  Last Pain:  Vitals:   09/26/21 0822  TempSrc: Temporal  PainSc: 0-No pain         Complications: No notable events documented.

## 2021-09-26 NOTE — Anesthesia Postprocedure Evaluation (Signed)
Anesthesia Post Note  Patient: Elizabeth Williamson  Procedure(s) Performed: TRANSESOPHAGEAL ECHOCARDIOGRAM (TEE) BUBBLE STUDY     Patient location during evaluation: PACU Anesthesia Type: MAC Level of consciousness: awake and alert Pain management: pain level controlled Vital Signs Assessment: post-procedure vital signs reviewed and stable Respiratory status: spontaneous breathing, nonlabored ventilation and respiratory function stable Cardiovascular status: stable and blood pressure returned to baseline Anesthetic complications: no   No notable events documented.  Last Vitals:  Vitals:   09/26/21 0936 09/26/21 0951  BP: 94/64 (!) 105/57  Pulse: 73 73  Resp: 10 15  Temp: (!) 36.4 C (!) 36.4 C  SpO2: 97% 98%    Last Pain:  Vitals:   09/26/21 0936  TempSrc:   PainSc: [place]

## 2021-09-26 NOTE — Anesthesia Preprocedure Evaluation (Addendum)
Anesthesia Evaluation  Patient identified by MRN, date of birth, ID band Patient awake    Reviewed: Allergy & Precautions, NPO status , Patient's Chart, lab work & pertinent test results  History of Anesthesia Complications Negative for: history of anesthetic complications  Airway Mallampati: II  TM Distance: >3 FB Neck ROM: Full    Dental  (+) Dental Advisory Given, Teeth Intact   Pulmonary asthma , former smoker,    Pulmonary exam normal        Cardiovascular Normal cardiovascular exam   '23 TTE - EF 60 to 65%. Agitated saline contrast bubble study was positive with shunting observed after >6 cardiac cycles suggestive of intrapulmonary shunting.     Neuro/Psych  Headaches,  ?CVA - 2 months ago, remains with left sided facial numbness    Neuromuscular disease negative psych ROS   GI/Hepatic Neg liver ROS, GERD  Medicated and Controlled,  Endo/Other  negative endocrine ROS  Renal/GU negative Renal ROS     Musculoskeletal negative musculoskeletal ROS (+)   Abdominal   Peds  Hematology negative hematology ROS (+)   Anesthesia Other Findings   Reproductive/Obstetrics                            Anesthesia Physical Anesthesia Plan  ASA: 3  Anesthesia Plan: MAC   Post-op Pain Management:    Induction:   PONV Risk Score and Plan: 2 and Propofol infusion and Treatment may vary due to age or medical condition  Airway Management Planned: Nasal Cannula and Natural Airway  Additional Equipment: None  Intra-op Plan:   Post-operative Plan:   Informed Consent: I have reviewed the patients History and Physical, chart, labs and discussed the procedure including the risks, benefits and alternatives for the proposed anesthesia with the patient or authorized representative who has indicated his/her understanding and acceptance.       Plan Discussed with: CRNA and  Anesthesiologist  Anesthesia Plan Comments:        Anesthesia Quick Evaluation

## 2021-09-26 NOTE — Progress Notes (Signed)
  Echocardiogram Echocardiogram Transesophageal has been performed.  Elizabeth Williamson 09/26/2021, 10:00 AM

## 2021-09-26 NOTE — Interval H&P Note (Signed)
History and Physical Interval Note:  09/26/2021 8:37 AM  Elizabeth Williamson  has presented today for surgery, with the diagnosis of PRIOR STROKE RULE OUT PFO.  The various methods of treatment have been discussed with the patient and family. After consideration of risks, benefits and other options for treatment, the patient has consented to  Procedure(s): TRANSESOPHAGEAL ECHOCARDIOGRAM (TEE) (N/A) as a surgical intervention.  The patient's history has been reviewed, patient examined, no change in status, stable for surgery.  I have reviewed the patient's chart and labs.  Questions were answered to the patient's satisfaction.     Meriam Sprague

## 2021-09-26 NOTE — CV Procedure (Signed)
     Transesophageal Echocardiogram Note  Elizabeth Williamson [id number] June 01, 1990  Procedure: Transesophageal Echocardiogram Indications: Embolic stroke  Procedure Details Consent: Obtained Time Out: Verified patient identification, verified procedure, site/side was marked, verified correct patient position, special equipment/implants available, Radiology Safety Procedures followed,  medications/allergies/relevent history reviewed, required imaging and test results available.  Performed  Medications: Propofol: 551mg   Left Ventrical:  LVEF 60-65%  Mitral Valve: Normal structure. Trivial MR  Aortic Valve: Tricuspid. Trivial AI  Tricuspid Valve: Normal structure. Trivial TR.  Pulmonic Valve: Normal structure. No PI  Left Atrium/ Left atrial appendage: No evidence of LAA thrombus  Atrial septum: Multiple agitated saline bubble studies performed with late bubbles seen consistent with intrapulmonary shunting. No definitive PFO visualized by color doppler on multiple angles of interrogation.   Aorta: Normal structure   Complications: No apparent complications Patient did tolerate procedure well.  , MD 09/26/2021, 9:35 AM

## 2021-09-28 ENCOUNTER — Encounter (HOSPITAL_COMMUNITY): Payer: Self-pay | Admitting: Cardiology

## 2021-10-03 ENCOUNTER — Ambulatory Visit: Payer: BC Managed Care – PPO | Admitting: Family

## 2021-11-07 ENCOUNTER — Encounter: Payer: Self-pay | Admitting: Neurology

## 2021-11-07 ENCOUNTER — Ambulatory Visit: Payer: BC Managed Care – PPO | Admitting: Family

## 2021-11-07 ENCOUNTER — Encounter: Payer: Self-pay | Admitting: Family

## 2021-11-07 VITALS — BP 108/68 | HR 66 | Temp 98.7°F | Resp 16 | Ht 67.0 in | Wt 169.0 lb

## 2021-11-07 DIAGNOSIS — Z833 Family history of diabetes mellitus: Secondary | ICD-10-CM | POA: Diagnosis not present

## 2021-11-07 DIAGNOSIS — D509 Iron deficiency anemia, unspecified: Secondary | ICD-10-CM | POA: Diagnosis not present

## 2021-11-07 DIAGNOSIS — F411 Generalized anxiety disorder: Secondary | ICD-10-CM

## 2021-11-07 DIAGNOSIS — Z13220 Encounter for screening for lipoid disorders: Secondary | ICD-10-CM | POA: Insufficient documentation

## 2021-11-07 DIAGNOSIS — I63139 Cerebral infarction due to embolism of unspecified carotid artery: Secondary | ICD-10-CM | POA: Insufficient documentation

## 2021-11-07 DIAGNOSIS — Q2112 Patent foramen ovale: Secondary | ICD-10-CM | POA: Insufficient documentation

## 2021-11-07 DIAGNOSIS — E559 Vitamin D deficiency, unspecified: Secondary | ICD-10-CM | POA: Diagnosis not present

## 2021-11-07 DIAGNOSIS — Z0000 Encounter for general adult medical examination without abnormal findings: Secondary | ICD-10-CM | POA: Insufficient documentation

## 2021-11-07 DIAGNOSIS — J301 Allergic rhinitis due to pollen: Secondary | ICD-10-CM

## 2021-11-07 NOTE — Assessment & Plan Note (Signed)
Reviewed recent vitamin d results Continue otc supplement for vitamin d daily

## 2021-11-07 NOTE — Assessment & Plan Note (Signed)
Antihistamine prn  ?

## 2021-11-07 NOTE — Progress Notes (Signed)
New Patient Office Visit  Subjective:  Patient ID: Elizabeth Williamson, female    DOB: [birth year]  Age: 31 y.o. MRN: [id number]  CC:  Chief Complaint  Patient presents with   Establish Care    HPI Elizabeth Williamson is here to establish care as a new patient.  Prior provider was: [name], not a recent pcp  Pt is without acute concerns.   Nexplanon placed April 2023, good for three years  Pap 2022, negative   April 2023, was on a trip and was driving back from [place]. When she got home that night she started with left sided pain and numbness blurry vision left eye- went to [facility] ER, abn MRI seen on scan. Sent to neurology, Dr. Epimenio Foot.  MRI did show on 08/07/21 numerous T2 flair hyperintense foci in subcorticol and deep white matter. Symptoms deemed to be concerning for possible demyelinating disease such as MS, neurologist stated that MRI much more consistent with chronic microvascular ischemic change, sequela of migraine or cardioemboli. Referred to cardiology as on bubble study did the patent foramen ovale.   Prior to this episode had started with migraines about a month before.   In the past one month no symptoms despite left sided numbness no weakness.   Numbness on left face, left arm and left lower leg.   Bubble study positive TEE with patent foramen ovale , thought was due to pulmonary shunt vs cardiac shunt, still pending f/u, pending a CTA . Pending CTA will determine if need to refer to pulmonary to be seen.   F/u appt with neurology 02/07/22.   chronic concerns:  Asthma as a child , no concerns.   GAD: stable and doing well on celexa 10 mg   Past Medical History:  Diagnosis Date   Alopecia    got injections through dermatologist   Migraine    rare    Past Surgical History:  Procedure Laterality Date   BUBBLE STUDY  09/26/2021   Procedure: BUBBLE STUDY;  Surgeon: Meriam Sprague, MD;  Location: [facility] ENDOSCOPY;  Service: Cardiovascular;;   diastasis  recti repair     HERNIA REPAIR  2021   umbilical   TEE WITHOUT CARDIOVERSION N/A 09/26/2021   Procedure: TRANSESOPHAGEAL ECHOCARDIOGRAM (TEE);  Surgeon: Meriam Sprague, MD;  Location: [facility] ENDOSCOPY;  Service: Cardiovascular;  Laterality: N/A;    Family History  Problem Relation Age of Onset   Healthy Mother    Healthy Father    Heart disease Brother        cardiomegaly   Diabetes Maternal Grandmother    Colon cancer Maternal Grandfather        43s   Hypertension Paternal Grandmother    Lung cancer Paternal Uncle        not a smoker    Social History   Socioeconomic History   Marital status: Married    Spouse name: Not on file   Number of children: 0   Years of education: Not on file   Highest education level: Not on file  Occupational History   Occupation: registered Academic librarian: [employer]  Tobacco Use   Smoking status: Former    Packs/day: 0.50    Years: 3.00    Total pack years: 1.50    Types: Cigarettes    Quit date: 08/14/2013    Years since quitting: 8.2   Smokeless tobacco: Never   Tobacco comments:    increased to 3 pack/week  Vaping Use   Vaping Use: Never used  Substance and Sexual Activity   Alcohol use: Yes    Comment: Occasional social drinker, every other weekend   Drug use: No   Sexual activity: Yes    Partners: Male    Birth control/protection: Implant  Other Topics Concern   Not on file  Social History Narrative   One boy one girl 6 and 5      Right handed   Coffee- 12 oz per day   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 No pets. 1 son (Dexton) born 10/12/15.  No passive tobacco exposure.    Social Determinants of Health   Financial Resource Strain: Not on file  Food Insecurity: Not on file  Transportation Needs: Not on file  Physical Activity: Not on file  Stress: Not on file  Social Connections: Socially Integrated (12/31/2018)   Social Connection and Isolation Panel [NHANES]    Frequency of Communication  with Friends and Family: Three times a week    Frequency of Social Gatherings with Friends and Family: Once a week    Attends Religious Services: More than 4 times per year    Active Member of [employer] or Organizations: Yes    Attends Engineer, structural: More than 4 times per year    Marital Status: Married  Catering manager Violence: Not At Risk (12/31/2018)   Humiliation, Afraid, Rape, and Kick questionnaire    Fear of Current or Ex-Partner: No    Emotionally Abused: No    Physically Abused: No    Sexually Abused: No    Outpatient Medications Prior to Visit  Medication Sig Dispense Refill   aspirin EC 81 MG tablet Take 81 mg by mouth daily.     Cholecalciferol (VITAMIN D) 50 MCG (2000 UT) CAPS Take 2,000 Units by mouth daily.     citalopram (CELEXA) 10 MG tablet Take 10 mg by mouth daily.     etonogestrel (NEXPLANON) 68 MG IMPL implant 68 mg by Subdermal route once.     Multiple Vitamin (MULTIVITAMIN WITH MINERALS) TABS tablet Take 2 tablets by mouth daily.     cholecalciferol (VITAMIN D3) 25 MCG (1000 UNIT) tablet Take 1,000 Units by mouth daily.     ondansetron (ZOFRAN) 4 MG tablet Take 4 mg by mouth every 8 (eight) hours as needed for nausea or vomiting.     rizatriptan (MAXALT) 5 MG tablet Take 1 tablet (5 mg total) by mouth as needed for migraine. May repeat in 2 hours if needed 10 tablet 0   ibuprofen (ADVIL) 200 MG tablet Take 400-600 mg by mouth every 6 (six) hours as needed for moderate pain. (Patient not taking: Reported on 11/07/2021)     No facility-administered medications prior to visit.        Objective:    Physical Exam  Gen: NAD, resting comfortably CV: RRR with no murmurs appreciated Pulm: NWOB, CTAB with no crackles, wheezes, or rhonchi Skin: warm, dry Psych: Normal affect and thought content  BP 108/68   Pulse 66   Temp 98.7 F (37.1 C)   Resp 16   Ht 5\' 7"  (1.702 m)   Wt 169 lb (76.7 kg)   SpO2 98%   BMI 26.47 kg/m  Wt Readings from  Last 3 Encounters:  11/07/21 169 lb (76.7 kg)  09/26/21 169 lb (76.7 kg)  09/06/21 169 lb (76.7 kg)     There are no preventive care reminders to display for this patient.  There are no preventive care reminders to display for this patient.  Lab Results  Component Value Date   TSH 0.659 08/13/2021   Lab Results  Component Value Date   WBC 4.0 09/26/2021   HGB 11.6 (L) 09/26/2021   HCT 36.8 09/26/2021   MCV 89.5 09/26/2021   PLT 191 09/26/2021   Lab Results  Component Value Date   NA 137 09/26/2021   K 4.5 09/26/2021   CO2 24 09/26/2021   GLUCOSE 97 09/26/2021   BUN 10 09/26/2021   CREATININE 0.69 09/26/2021   BILITOT 0.9 09/26/2021   ALKPHOS 43 09/26/2021   AST 23 09/26/2021   ALT 12 09/26/2021   PROT 6.7 09/26/2021   ALBUMIN 3.9 09/26/2021   CALCIUM 9.2 09/26/2021   ANIONGAP 5 09/26/2021   Lab Results  Component Value Date   CHOL 196 09/23/2019   Lab Results  Component Value Date   HDL 107 09/23/2019   Lab Results  Component Value Date   LDLCALC 81 09/23/2019   Lab Results  Component Value Date   TRIG 36 09/23/2019   Lab Results  Component Value Date   CHOLHDL 1.8 09/23/2019   Lab Results  Component Value Date   HGBA1C 5.2 12/31/2018      Assessment & Plan:   Problem List Items Addressed This Visit       Cardiovascular and Mediastinum   Patent foramen ovale with right to left shunt    Continue f/u with neurology as indicated      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        Relevant Orders   Lipid panel   Family history of diet-controlled diabetes    Ordering a1c per pt request       Relevant Orders   Hemoglobin A1c   GAD (generalized anxiety disorder)    Continue celexa 10 mg once daily  Work on anxiety reducing techniques      Encounter for medical examination to establish care    D/w with pt and went over chronic and acute history as indicated Went over family social and medical/surgical history  Discussed h/o lab results and or diagnostic testing as indicated        No orders of the defined types were placed in this encounter.   Follow-up: Return in about 6 months (around 05/10/2022) for regular follow up appointment.    Mort Sawyers, FNP

## 2021-11-07 NOTE — Assessment & Plan Note (Signed)
Continue celexa 10 mg once daily  Work on anxiety reducing techniques

## 2021-11-07 NOTE — Assessment & Plan Note (Signed)
Lipid panel ordered pending results.   

## 2021-11-07 NOTE — Assessment & Plan Note (Signed)
Order cbc ibc ferritin  ?Pending results ?

## 2021-11-07 NOTE — Assessment & Plan Note (Signed)
Ordering a1c per pt request

## 2021-11-07 NOTE — Assessment & Plan Note (Signed)
D/w with pt and went over chronic and acute history as indicated Went over family social and medical/surgical history  Discussed h/o lab results and or diagnostic testing as indicated   

## 2021-11-07 NOTE — Assessment & Plan Note (Signed)
Continue asa 81 mg  Continue f/u with cardiology, neurology, and possible referral from cardiology to pulmonary pending f/u appt and results discussed with pt in regards to bubble study and TEE/CTA

## 2021-11-07 NOTE — Assessment & Plan Note (Signed)
Continue f/u with neurology as indicated

## 2021-11-07 NOTE — Patient Instructions (Signed)
  I have created an order for lab work today during our visit.  Please schedule an appointment on your way out to return to the lab at your convenience. Please return fasting at your lab appointment (meaning you can only drink black coffee and or water prior to your appointment). I will reach out to you in regards to the labs when I receive the results.    Welcome to our clinic, I am happy to have you as my new patient. I am excited to continue on this healthcare journey with you.  Stop by the lab prior to leaving today. I will notify you of your results once received.   Please keep in mind Any my chart messages you send have up to a three business day turnaround for a response.  Phone calls may take up to a one full business day turnaround for a  response.   If you need a medication refill I recommend you request it through the pharmacy as this is easiest for us rather than sending a message and or phone call.   Due to recent changes in healthcare laws, you may see results of your imaging and/or laboratory studies on MyChart before I have had a chance to review them.  I understand that in some cases there may be results that are confusing or concerning to you. Please understand that not all results are received at the same time and often I may need to interpret multiple results in order to provide you with the best plan of care or course of treatment. Therefore, I ask that you please give me 2 business days to thoroughly review all your results before contacting my office for clarification. Should we see a critical lab result, you will be contacted sooner.   It was a pleasure seeing you today! Please do not hesitate to reach out with any questions and or concerns.  Regards,   Danecia Underdown FNP-C    

## 2021-11-15 ENCOUNTER — Other Ambulatory Visit (INDEPENDENT_AMBULATORY_CARE_PROVIDER_SITE_OTHER): Payer: BC Managed Care – PPO

## 2021-11-15 DIAGNOSIS — Z13220 Encounter for screening for lipoid disorders: Secondary | ICD-10-CM

## 2021-11-15 DIAGNOSIS — Z833 Family history of diabetes mellitus: Secondary | ICD-10-CM | POA: Diagnosis not present

## 2021-11-15 DIAGNOSIS — D509 Iron deficiency anemia, unspecified: Secondary | ICD-10-CM

## 2021-11-15 LAB — CBC
HCT: 34.9 % — ABNORMAL LOW (ref 36.0–46.0)
Hemoglobin: 11.6 g/dL — ABNORMAL LOW (ref 12.0–15.0)
MCHC: 33.1 g/dL (ref 30.0–36.0)
MCV: 87.9 fl (ref 78.0–100.0)
Platelets: 174 10*3/uL (ref 150.0–400.0)
RBC: 3.97 Mil/uL (ref 3.87–5.11)
RDW: 12.4 % (ref 11.5–15.5)
WBC: 4.1 10*3/uL (ref 4.0–10.5)

## 2021-11-15 LAB — IBC + FERRITIN
Ferritin: 30.1 ng/mL (ref 10.0–291.0)
Iron: 95 ug/dL (ref 42–145)
Saturation Ratios: 30.6 % (ref 20.0–50.0)
TIBC: 310.8 ug/dL (ref 250.0–450.0)
Transferrin: 222 mg/dL (ref 212.0–360.0)

## 2021-11-15 LAB — HEMOGLOBIN A1C: Hgb A1c MFr Bld: 5.6 % (ref 4.6–6.5)

## 2021-11-15 LAB — LIPID PANEL
Cholesterol: 177 mg/dL (ref 0–200)
HDL: 90.6 mg/dL (ref >39.00)
LDL Cholesterol: 82 mg/dL (ref 0–99)
NonHDL: 86.75
Total CHOL/HDL Ratio: 2
Triglycerides: 25 mg/dL (ref 0.0–149.0)
VLDL: 5 mg/dL (ref 0.0–40.0)

## 2021-11-28 ENCOUNTER — Ambulatory Visit
Admission: RE | Admit: 2021-11-28 | Discharge: 2021-11-28 | Disposition: A | Discharge status: Home / Self Care | Payer: BC Managed Care – PPO | Source: Ambulatory Visit | Attending: Neurology | Admitting: Neurology

## 2021-11-28 DIAGNOSIS — R931 Abnormal findings on diagnostic imaging of heart and coronary circulation: Secondary | ICD-10-CM

## 2021-11-28 MED ORDER — IOPAMIDOL (ISOVUE-370) INJECTION 76%
75.0000 mL | Freq: Once | INTRAVENOUS | Status: AC | PRN
  Administered 2021-11-28: 75 mL via INTRAVENOUS

## 2021-12-01 ENCOUNTER — Encounter: Payer: Self-pay | Admitting: Family

## 2021-12-01 DIAGNOSIS — K5900 Constipation, unspecified: Secondary | ICD-10-CM

## 2021-12-01 MED ORDER — POLYETHYLENE GLYCOL 3350 17 GM/SCOOP PO POWD: 17.0000 g | Freq: Every day | ORAL | 0 refills | Status: AC | PRN

## 2022-02-07 ENCOUNTER — Ambulatory Visit: Payer: BC Managed Care – PPO | Admitting: Neurology

## 2022-02-07 ENCOUNTER — Encounter: Payer: Self-pay | Admitting: Neurology

## 2022-02-07 VITALS — BP 122/74 | HR 75 | Ht 67.0 in | Wt 175.8 lb

## 2022-02-07 DIAGNOSIS — R931 Abnormal findings on diagnostic imaging of heart and coronary circulation: Secondary | ICD-10-CM | POA: Insufficient documentation

## 2022-02-07 DIAGNOSIS — R9082 White matter disease, unspecified: Secondary | ICD-10-CM | POA: Diagnosis not present

## 2022-02-07 DIAGNOSIS — R208 Other disturbances of skin sensation: Secondary | ICD-10-CM | POA: Diagnosis not present

## 2022-02-07 NOTE — Progress Notes (Signed)
[facility]  PATIENT: Elizabeth Williamson DOB: 1990-07-07  REFERRING DOCTOR OR PCP:  Ronna Polio, MD SOURCE: Patient, notes from 08/07/2021 emergency room visit, imaging and lab reports, MRI images personally reviewed.  _________________________________   HISTORICAL  CHIEF COMPLAINT:  Chief Complaint  Patient presents with   Follow-up    RM 1, alone. Last seen 08/31/21. Still having numbness on left side that is constant. Has improved since last visit. Has only two bad headaches in the last month.     HISTORY OF PRESENT ILLNESS:  Elizabeth Williamson is a 31 y.o. woman with paresthesias and abnormal brain MRI.  Update 08/31/2021: She continues to have some left sided tingling and some headaches but no new symptoms.   It is numb but not painful.  HAs occur just 1-2 a month.   Not associate with menstrual cycle.  When a Headache occurs she takes Zofran and Excedrin with benefit.    Due to multiple small round subcortical foci in the head an Echo with bubble contrast was performed.   She was found to have passage of some bubbles 6 beats after injection (most c/w intrapulmonary shunting rather than ASD/PFO)  CT angiogram of the chest showed no evidence of an AVM.  Echocardiogram with bubble contrast showed some bubbles that passed though not certain of the PFO.  She had a TEE 09/26/2021 and there was no evidence of PFO or any other cardiac shunt.  She denies more episodes of chest pain.      She has no h/o DVT, PE.     She has a good gait andbalance.   Strength is good.  She has sometimes had urinary urgency when LBP was present.    Vascular risks:   No cardiac issues except some palpitations,   She does not smoke now but smoked 1/2 - 1 ppd x 5 years.  No DM or HTN.   History of Numbness and studies: She has experienced numbness nad tingling in te left side of her body since 2022.  In the more distant past, she would have episodes of numbness and pian in the  left arm but not leg like more recently.     Last week, she had more increased pain in her left side and back and difficulty with vision.   She noted blurriness out of her left eye.   She feels these symptoms are the same today as a few days ago when she went to the ED 08/07/2021.    She last saw ophthalmology Decembr 2022.  At that time the left eye was mildly worse than her right eye (we don;t have records).     She went to the ED October 2022 due to LBP and numbness in the left leg.   Imaging was not performed.   She had a prednisone pack and symptoms improved.      She saw [facility] a couple weeks ago and was told no Ortho issue and that she should see Neuology.      She had migraines as a teenager but no typical migraines x many years.   She reports a chemical exposure last year and had headache  She was a full term twin and was breech and weighted 5  pounds 11 oz   No difficulty with pregnancy or delivery.  She reached all milestones.      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       Imaging: MRI of the brain 08/07/2021  shows numerous T2/FLAIR hyperintense foci in the subcortical and deep white matter.  A couple foci are juxtacortical.  There are only a couple very small periventricular foci and no infratentorial foci.  None of the foci appear to be acute on diffusion-weighted imaging there was no enhancement.  Fourth ventricle larger than typical but adjacent brain is normal and this is unlikely to be significant.  MRI of the cervical spine 08/07/2021 was normal  Transthoracic echocardiogram 08/18/2021 with bubble contrast showed bubbles passing.  They were late (greater than 6 cardiac cycles) more suggestive of intrapulmonary shunt than PFO.  Transesophageal echo 09/26/2021 showed no evidence of intracardiac shunt or PFO.  CT angiogram of the chest 11/28/2021 showed no evidence of AVM.  Laboratory: CBC and BMP 08/07/2021 were normal 07/11/2020: TB negative, hep B  surface antibody positive, varicella IgG positive   REVIEW OF SYSTEMS: Constitutional: No fevers, chills, sweats, or change in appetite Eyes: No visual changes, double vision, eye pain Ear, nose and throat: No hearing loss, ear pain, nasal congestion, sore throat Cardiovascular: No chest pain, palpitations Respiratory:  No shortness of breath at rest or with exertion.   No wheezes GastrointestinaI: No nausea, vomiting, diarrhea, abdominal pain, fecal incontinence Genitourinary:  No dysuria, urinary retention or frequency.  No nocturia. Musculoskeletal:  No neck pain, back pain Integumentary: No rash, pruritus, skin lesions Neurological: as above Psychiatric: No depression at this time.  No anxiety Endocrine: No palpitations, diaphoresis, change in appetite, change in weigh or increased thirst Hematologic/Lymphatic:  No anemia, purpura, petechiae. Allergic/Immunologic: No itchy/runny eyes, nasal congestion, recent allergic reactions, rashes  ALLERGIES: No Known Allergies  HOME MEDICATIONS:  Current Outpatient Medications:    aspirin EC 81 MG tablet, Take 81 mg by mouth daily., Disp: , Rfl:    aspirin-acetaminophen-caffeine (EXCEDRIN MIGRAINE) 250-250-65 MG tablet, Take by mouth every 6 (six) hours as needed for headache., Disp: , Rfl:    Cholecalciferol (VITAMIN D) 50 MCG (2000 UT) CAPS, Take 2,000 Units by mouth daily., Disp: , Rfl:    citalopram (CELEXA) 10 MG tablet, Take 10 mg by mouth daily., Disp: , Rfl:    etonogestrel (NEXPLANON) 68 MG IMPL implant, 68 mg by Subdermal route once., Disp: , Rfl:    Ferrous Sulfate (IRON PO), Take 1 Dose by mouth daily., Disp: , Rfl:    Multiple Vitamin (MULTIVITAMIN WITH MINERALS) TABS tablet, Take 2 tablets by mouth daily., Disp: , Rfl:    Ondansetron (ZOFRAN ODT PO), Take by mouth., Disp: , Rfl:   PAST MEDICAL HISTORY: Past Medical History:  Diagnosis Date   Alopecia    got injections through dermatologist   Migraine    rare    PAST  SURGICAL HISTORY: Past Surgical History:  Procedure Laterality Date   BUBBLE STUDY  09/26/2021   Procedure: BUBBLE STUDY;  Surgeon: Elizabeth Sprague, MD;  Location: [facility] ENDOSCOPY;  Service: Cardiovascular;;   diastasis recti repair     HERNIA REPAIR  2021   umbilical   TEE WITHOUT CARDIOVERSION N/A 09/26/2021   Procedure: TRANSESOPHAGEAL ECHOCARDIOGRAM (TEE);  Surgeon: Elizabeth Sprague, MD;  Location: [facility] ENDOSCOPY;  Service: Cardiovascular;  Laterality: N/A;    FAMILY HISTORY: Family History  Problem Relation Age of Onset   Healthy Mother    Healthy Father    Heart disease Brother        cardiomegaly   Diabetes Maternal Grandmother    Colon cancer Maternal Grandfather        28s   Hypertension Paternal  Grandmother    Lung cancer Paternal Uncle        not a smoker    SOCIAL HISTORY:  Social History   Socioeconomic History   Marital status: Married    Spouse name: Not on file   Number of children: 0   Years of education: Not on file   Highest education level: Not on file  Occupational History   Occupation: registered nurse    Employer: [employer]  Tobacco Use   Smoking status: Former    Packs/day: 0.50    Years: 3.00    Total pack years: 1.50    Types: Cigarettes    Quit date: 08/14/2013    Years since quitting: 8.4   Smokeless tobacco: Never   Tobacco comments:    increased to 3 pack/week  Vaping Use   Vaping Use: Never used  Substance and Sexual Activity   Alcohol use: Yes    Comment: Occasional social drinker, every other weekend   Drug use: No   Sexual activity: Yes    Partners: Male    Birth control/protection: Implant  Other Topics Concern   Not on file  Social History Narrative   One boy one girl 6 and 5      Right handed   Coffee- 12 oz per day   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 No pets. 1 son (Dexton) born 10/12/15.  No passive tobacco exposure.    Social Determinants of Health   Financial Resource Strain: Not on  file  Food Insecurity: Not on file  Transportation Needs: Not on file  Physical Activity: Not on file  Stress: Not on file  Social Connections: Socially Integrated (12/31/2018)   Social Connection and Isolation Panel [NHANES]    Frequency of Communication with Friends and Family: Three times a week    Frequency of Social Gatherings with Friends and Family: Once a week    Attends Religious Services: More than 4 times per year    Active Member of [employer] or Organizations: Yes    Attends Music therapist: More than 4 times per year    Marital Status: Married  Human resources officer Violence: Not At Risk (12/31/2018)   Humiliation, Afraid, Rape, and Kick questionnaire    Fear of Current or Ex-Partner: No    Emotionally Abused: No    Physically Abused: No    Sexually Abused: No     PHYSICAL EXAM  Vitals:   02/07/22 1244  BP: 122/74  Pulse: 75  Weight: 175 lb 12.8 oz (79.7 kg)  Height: 5\' 7"  (1.702 m)    Body mass index is 27.53 kg/m.   General: The patient is well-developed and well-nourished and in no acute distress  HEENT:  Head is [place]/AT.  Sclera are anicteric.    Skin: Extremities are without rash or  edema.  Musculoskeletal:  Back is nontender  Neurologic Exam  Mental status: The patient is alert and oriented x 3 at the time of the examination. The patient has apparent normal recent and remote memory, with an apparently normal attention span and concentration ability.   Speech is normal.  Cranial nerves: Extraocular movements are full.  Facial stregnth and snsation were fine.   No obvious hearing deficits are noted.  Motor:  Muscle bulk is normal.   Tone is normal. Strength is  5 / 5 in all 4 extremities.   Sensory: Sensory testing today was symmetric to temp and vib  Coordination: Cerebellar testing reveals good  finger-nose-finger and heel-to-shin bilaterally.  Gait and station: Station is normal.   Gait is normal. Tandem gait is normal. Romberg is negative.    Reflexes: Deep tendon reflexes are symmetric and normal bilaterally.       DIAGNOSTIC DATA (LABS, IMAGING, TESTING) - I reviewed patient records, labs, notes, testing and imaging myself where available.  Lab Results  Component Value Date   WBC 4.1 11/15/2021   HGB 11.6 (L) 11/15/2021   HCT 34.9 (L) 11/15/2021   MCV 87.9 11/15/2021   PLT 174.0 11/15/2021      Component Value Date/Time   NA 137 09/26/2021 0855   NA 142 12/31/2018 1632   K 4.5 09/26/2021 0855   CL 108 09/26/2021 0855   CO2 24 09/26/2021 0855   GLUCOSE 97 09/26/2021 0855   BUN 10 09/26/2021 0855   BUN 10 12/31/2018 1632   CREATININE 0.69 09/26/2021 0855   CREATININE 0.58 07/19/2014 0001   CALCIUM 9.2 09/26/2021 0855   PROT 6.7 09/26/2021 0855   PROT 6.8 09/27/2017 0916   ALBUMIN 3.9 09/26/2021 0855   ALBUMIN 4.4 09/27/2017 0916   AST 23 09/26/2021 0855   ALT 12 09/26/2021 0855   ALKPHOS 43 09/26/2021 0855   BILITOT 0.9 09/26/2021 0855   BILITOT 0.2 09/27/2017 0916   GFRNONAA >60 09/26/2021 0855   GFRAA >60 08/21/2019 1611   Lab Results  Component Value Date   CHOL 177 11/15/2021   HDL 90.60 11/15/2021   LDLCALC 82 11/15/2021   TRIG 25.0 11/15/2021   CHOLHDL 2 11/15/2021   Lab Results  Component Value Date   HGBA1C 5.6 11/15/2021   Lab Results  Component Value Date   VITAMINB12 638 08/09/2021   Lab Results  Component Value Date   TSH 0.659 08/13/2021       ASSESSMENT AND PLAN  White matter abnormality on MRI of brain  Abnormal echocardiogram  Dysesthesia   Although the initial transthoracic echocardiogram was potentially concerning for an intrapulmonary shunt, CT angiogram did not show any evidence of this.  Additionally, TEE did not show any intracardiac shunt.  Her main risk factors for the T2 hyperintense foci on brain MRI would be migraine headache.  She also has about a 5-pack-year history of smoking but has not smoked for many years.  We also discussed that it is possible  she was born with those changes.  Regardless, I will want to check another MRI in about a year to determine the stability.  If progression is occurring we would need to reconsider other sources. For the migraines, since they only occur about once a month, she will take Excedrin and Zofran. Return in 12 months or sooner if there are new or worsening neurologic symptoms or based on results of further studies.    Elizabeth Arno A. Epimenio Foot, MD, [facility] 02/07/2022, 1:29 PM Certified in Neurology, Clinical Neurophysiology, Sleep Medicine and Neuroimaging  [facility] [address] [place], [place] [address] [phone]

## 2022-04-11 ENCOUNTER — Encounter: Payer: Self-pay | Admitting: Family

## 2022-04-11 DIAGNOSIS — K5900 Constipation, unspecified: Secondary | ICD-10-CM

## 2022-04-11 DIAGNOSIS — K5904 Chronic idiopathic constipation: Secondary | ICD-10-CM

## 2022-04-17 NOTE — Telephone Encounter (Signed)
Left message to return call to our office.  

## 2022-04-17 NOTE — Telephone Encounter (Signed)
When was the last time pt took her linzess? The last visit with GI I see was back in 2021. Is this correct?

## 2022-04-19 DIAGNOSIS — K5904 Chronic idiopathic constipation: Secondary | ICD-10-CM | POA: Insufficient documentation

## 2022-04-19 MED ORDER — LINACLOTIDE 145 MCG PO CAPS: 145.0000 ug | ORAL_CAPSULE | Freq: Every day | ORAL | 0 refills | Status: DC

## 2022-05-15 ENCOUNTER — Ambulatory Visit: Payer: BC Managed Care – PPO | Admitting: Family

## 2022-05-15 ENCOUNTER — Encounter: Payer: Self-pay | Admitting: Family

## 2022-05-15 VITALS — BP 120/72 | HR 98 | Temp 98.5°F | Ht 67.0 in | Wt 173.6 lb

## 2022-05-15 DIAGNOSIS — F411 Generalized anxiety disorder: Secondary | ICD-10-CM

## 2022-05-15 DIAGNOSIS — D509 Iron deficiency anemia, unspecified: Secondary | ICD-10-CM

## 2022-05-15 DIAGNOSIS — R931 Abnormal findings on diagnostic imaging of heart and coronary circulation: Secondary | ICD-10-CM

## 2022-05-15 LAB — CBC
HCT: 37.6 % (ref 36.0–46.0)
Hemoglobin: 12.7 g/dL (ref 12.0–15.0)
MCHC: 33.7 g/dL (ref 30.0–36.0)
MCV: 87.2 fl (ref 78.0–100.0)
Platelets: 193 10*3/uL (ref 150.0–400.0)
RBC: 4.31 Mil/uL (ref 3.87–5.11)
RDW: 12.3 % (ref 11.5–15.5)
WBC: 4.2 10*3/uL (ref 4.0–10.5)

## 2022-05-15 LAB — IBC + FERRITIN
Ferritin: 75.8 ng/mL (ref 10.0–291.0)
Iron: 88 ug/dL (ref 42–145)
Saturation Ratios: 28.8 % (ref 20.0–50.0)
TIBC: 305.2 ug/dL (ref 250.0–450.0)
Transferrin: 218 mg/dL (ref 212.0–360.0)

## 2022-05-15 MED ORDER — CITALOPRAM HYDROBROMIDE 20 MG PO TABS: 20.0000 mg | ORAL_TABLET | Freq: Every day | ORAL | 3 refills | Status: DC

## 2022-05-15 NOTE — Assessment & Plan Note (Addendum)
Increased anxiety slight with school  Increase celexa to 20 mg once daily

## 2022-05-15 NOTE — Assessment & Plan Note (Signed)
stable °

## 2022-05-15 NOTE — Assessment & Plan Note (Signed)
Unable to tolerate oral iron Will repeat cbc ibc ferritin today pending results

## 2022-05-15 NOTE — Patient Instructions (Signed)
  Increase celexa to 20 mg once daily.   Stop by the lab prior to leaving today. I will notify you of your results once received.   Regards,   Eugenia Pancoast FNP-C

## 2022-05-15 NOTE — Progress Notes (Signed)
Established Patient Office Visit  Subjective:      CC:  Chief Complaint  Patient presents with   Medical Management of Chronic Issues    HPI: Elizabeth Williamson is a 32 y.o. female presenting on 05/15/2022 for Medical Management of Chronic Issues .  GAD: still taking celexa 20 mg once daily, has been taking 1/2 tablet for total of 10 mg. She is thinking of increasing dose. She has some slight breakthrough anxiety.   IDA: was taking otc but caused constipation with daily iron. Tried slow fe but the constipation worsened so stopped taking.   Periods, cramping week before and then cramping and bleeding the week of and cramping a week later. Still has nexplanon in place. Has a gynecologist and thinks will schedule f/u appt.  Cva h/o , last f/u was in October. Advised by neurology did not need to take asa anymore and could do annual f/u appt.      Social history:  Relevant past medical, surgical, family and social history reviewed and updated as indicated. Interim medical history since our last visit reviewed.  Allergies and medications reviewed and updated.  DATA REVIEWED: CHART IN EPIC     ROS: Negative unless specifically indicated above in HPI.    Current Outpatient Medications:    aspirin-acetaminophen-caffeine (EXCEDRIN MIGRAINE) 250-250-65 MG tablet, Take by mouth every 6 (six) hours as needed for headache., Disp: , Rfl:    Cholecalciferol (VITAMIN D) 50 MCG (2000 UT) CAPS, Take 2,000 Units by mouth daily., Disp: , Rfl:    etonogestrel (NEXPLANON) 68 MG IMPL implant, 68 mg by Subdermal route once., Disp: , Rfl:    linaclotide (LINZESS) 145 MCG CAPS capsule, Take 1 capsule (145 mcg total) by mouth daily before breakfast., Disp: 90 capsule, Rfl: 0   Multiple Vitamin (MULTIVITAMIN WITH MINERALS) TABS tablet, Take 2 tablets by mouth daily., Disp: , Rfl:    Ondansetron (ZOFRAN ODT PO), Take by mouth., Disp: , Rfl:    citalopram (CELEXA) 20 MG tablet, Take 1 tablet (20 mg  total) by mouth daily., Disp: 90 tablet, Rfl: 3      Objective:    BP 120/72   Pulse 98   Temp 98.5 F (36.9 C) (Oral)   Ht 5\' 7"  (1.702 m)   Wt 173 lb 9.6 oz (78.7 kg)   SpO2 99%   BMI 27.19 kg/m   Wt Readings from Last 3 Encounters:  05/15/22 173 lb 9.6 oz (78.7 kg)  02/07/22 175 lb 12.8 oz (79.7 kg)  11/07/21 169 lb (76.7 kg)    Physical Exam  Physical Exam Constitutional:      General: not in acute distress.    Appearance: Normal appearance. normal weight. is not ill-appearing, toxic-appearing or diaphoretic.  Cardiovascular:     Rate and Rhythm: Normal rate.  Pulmonary:     Effort: Pulmonary effort is normal.  Musculoskeletal:        General: Normal range of motion.  Neurological:     General: No focal deficit present.     Mental Status: alert and oriented to person, place, and time. Mental status is at baseline.  Psychiatric:        Mood and Affect: Mood normal.        Behavior: Behavior normal.        Thought Content: Thought content normal.        Judgment: Judgment normal.        Assessment & Plan:  GAD (generalized anxiety disorder) Assessment &  Plan: Increased anxiety slight with school  Increase celexa to 20 mg once daily   Orders: -     Citalopram Hydrobromide; Take 1 tablet (20 mg total) by mouth daily.  Dispense: 90 tablet; Refill: 3  Abnormal echocardiogram Assessment & Plan: stable   Iron deficiency anemia, unspecified iron deficiency anemia type Assessment & Plan: Unable to tolerate oral iron Will repeat cbc ibc ferritin today pending results  Orders: -     CBC -     IBC + Ferritin     Return in about 6 months (around 11/13/2022) for f/u anxiety.  Eugenia Pancoast, MSN, APRN, FNP-C [place]

## 2022-08-24 ENCOUNTER — Encounter: Payer: Self-pay | Admitting: Family

## 2022-08-24 ENCOUNTER — Ambulatory Visit: Payer: BC Managed Care – PPO | Admitting: Family

## 2022-08-24 VITALS — BP 118/74 | HR 70 | Temp 97.9°F | Ht 67.0 in | Wt 175.4 lb

## 2022-08-24 DIAGNOSIS — J0100 Acute maxillary sinusitis, unspecified: Secondary | ICD-10-CM | POA: Insufficient documentation

## 2022-08-24 DIAGNOSIS — J3489 Other specified disorders of nose and nasal sinuses: Secondary | ICD-10-CM

## 2022-08-24 MED ORDER — AMOXICILLIN-POT CLAVULANATE 875-125 MG PO TABS: 1.0000 | ORAL_TABLET | Freq: Two times a day (BID) | ORAL | 0 refills | Status: DC

## 2022-08-24 MED ORDER — METHYLPREDNISOLONE 4 MG PO TBPK: ORAL_TABLET | ORAL | 0 refills | Status: DC

## 2022-08-24 NOTE — Assessment & Plan Note (Signed)
Prescription given for augmentin 875/125 mg po bid for ten days. Pt to continue tylenol/ibuprofen prn sinus pain. Continue with humidifier prn and steam showers recommended as well. instructed If no symptom improvement in 48 hours please f/u ? ?

## 2022-08-24 NOTE — Progress Notes (Signed)
Established Patient Office Visit  Subjective:   Patient ID: Elizabeth Williamson, female    DOB: [birth year]  Age: 32 y.o. MRN: [id number]  CC:  Chief Complaint  Patient presents with   Headache    HPI: Elizabeth Williamson is a 32 y.o. female presenting on 08/24/2022 for Headache   Headache     Pt here with c/o headaches and some nausea.  She does have h/o nausea however she has since progressed to sinus like symptoms and with body aches. She is getting nauseous from all of the drainage she thinks. No sore throat. No ear pain. No fever or chills.   She started with sudafed and tylenol with only mild relief.  She did try excedrin migraine and that wasn't helpful.   She is taking zyrtec as well.      ROS: Negative unless specifically indicated above in HPI.   Relevant past medical history reviewed and updated as indicated.   Allergies and medications reviewed and updated.   Current Outpatient Medications:    amoxicillin-clavulanate (AUGMENTIN) 875-125 MG tablet, Take 1 tablet by mouth 2 (two) times daily., Disp: 20 tablet, Rfl: 0   aspirin-acetaminophen-caffeine (EXCEDRIN MIGRAINE) 250-250-65 MG tablet, Take by mouth every 6 (six) hours as needed for headache., Disp: , Rfl:    Cholecalciferol (VITAMIN D) 50 MCG (2000 UT) CAPS, Take 2,000 Units by mouth daily., Disp: , Rfl:    citalopram (CELEXA) 20 MG tablet, Take 1 tablet (20 mg total) by mouth daily., Disp: 90 tablet, Rfl: 3   levonorgestrel (MIRENA) 20 MCG/DAY IUD, 1 each by Intrauterine route once., Disp: , Rfl:    Multiple Vitamin (MULTIVITAMIN WITH MINERALS) TABS tablet, Take 2 tablets by mouth daily., Disp: , Rfl:    Ondansetron (ZOFRAN ODT PO), Take by mouth., Disp: , Rfl:   Allergies  Allergen Reactions   Iron Other (See Comments)    constipation    Objective:   BP 118/74   Pulse 70   Temp 97.9 F (36.6 C) (Temporal)   Ht 5\' 7"  (1.702 m)   Wt 175 lb 6.4 oz (79.6 kg)   SpO2 99%   BMI 27.47 kg/m     Physical Exam Constitutional:      General: She is not in acute distress.    Appearance: Normal appearance. She is well-developed and normal weight. She is not ill-appearing, toxic-appearing or diaphoretic.  HENT:     Head: Normocephalic.     Right Ear: Tympanic membrane normal.     Left Ear: Tympanic membrane normal.     Nose: Nose normal.     Mouth/Throat:     Mouth: Mucous membranes are dry.     Pharynx: No oropharyngeal exudate or posterior oropharyngeal erythema.  Eyes:     Extraocular Movements: Extraocular movements intact.     Pupils: Pupils are equal, round, and reactive to light.  Cardiovascular:     Rate and Rhythm: Normal rate and regular rhythm.     Pulses: Normal pulses.     Heart sounds: Normal heart sounds.  Pulmonary:     Effort: Pulmonary effort is normal.     Breath sounds: Normal breath sounds.  Musculoskeletal:     Cervical back: Normal range of motion.  Neurological:     General: No focal deficit present.     Mental Status: She is alert and oriented to person, place, and time. Mental status is at baseline.  Psychiatric:        Mood and Affect: Mood  normal.        Behavior: Behavior normal.        Thought Content: Thought content normal.        Judgment: Judgment normal.     Assessment & Plan:  Acute non-recurrent maxillary sinusitis Assessment & Plan: Prescription given for augmentin 875/125 mg po bid for ten days. Pt to continue tylenol/ibuprofen prn sinus pain. Continue with humidifier prn and steam showers recommended as well. instructed If no symptom improvement in 48 hours please f/u   Orders: -     Amoxicillin-Pot Clavulanate; Take 1 tablet by mouth 2 (two) times daily.  Dispense: 20 tablet; Refill: 0     Follow up plan: Return if symptoms worsen or fail to improve.  Mort Sawyers, FNP

## 2022-09-03 ENCOUNTER — Encounter: Payer: Self-pay | Admitting: Neurology

## 2022-09-03 ENCOUNTER — Other Ambulatory Visit: Payer: Self-pay | Admitting: *Deleted

## 2022-09-03 MED ORDER — ONDANSETRON 4 MG PO TBDP: ORAL_TABLET | ORAL | 2 refills | Status: DC

## 2022-09-07 ENCOUNTER — Other Ambulatory Visit: Payer: Self-pay | Admitting: Neurology

## 2022-09-07 ENCOUNTER — Ambulatory Visit: Payer: BC Managed Care – PPO | Admitting: Neurology

## 2022-09-07 ENCOUNTER — Encounter: Payer: Self-pay | Admitting: Neurology

## 2022-09-07 VITALS — BP 114/70 | HR 75 | Ht 66.0 in | Wt 178.5 lb

## 2022-09-07 DIAGNOSIS — R9082 White matter disease, unspecified: Secondary | ICD-10-CM | POA: Diagnosis not present

## 2022-09-07 DIAGNOSIS — G43709 Chronic migraine without aura, not intractable, without status migrainosus: Secondary | ICD-10-CM

## 2022-09-07 DIAGNOSIS — R208 Other disturbances of skin sensation: Secondary | ICD-10-CM | POA: Diagnosis not present

## 2022-09-07 DIAGNOSIS — R931 Abnormal findings on diagnostic imaging of heart and coronary circulation: Secondary | ICD-10-CM | POA: Diagnosis not present

## 2022-09-07 MED ORDER — RIZATRIPTAN BENZOATE 10 MG PO TBDP: 10.0000 mg | ORAL_TABLET | ORAL | 11 refills | Status: DC | PRN

## 2022-09-07 MED ORDER — LEVETIRACETAM 750 MG PO TABS: 750.0000 mg | ORAL_TABLET | Freq: Two times a day (BID) | ORAL | 5 refills | Status: DC

## 2022-09-07 NOTE — Progress Notes (Signed)
[facility]  PATIENT: Elizabeth Williamson DOB: [birth year]  REFERRING DOCTOR OR PCP:  Ronna Polio, MD SOURCE: Patient, notes from 08/07/2021 emergency room visit, imaging and lab reports, MRI images personally reviewed.  _________________________________   HISTORICAL  CHIEF COMPLAINT:  Chief Complaint  Patient presents with   Follow-up    Rm 11. Patient alone. Reporting migraines at least 3 days a week and the last week has had a headache the last week.     HISTORY OF PRESENT ILLNESS:  Elizabeth Williamson is a 31 y.o. woman with paresthesias and abnormal brain MRI.  Update 09/07/2022: She is experiencing migraines every day the past 2 weeks but has generally been 3-4/week.  Some headaches awaken her from sleep.   Numbness often occurs on her left.   When one occurs, she takes Excedrin and/or Ibuprofen 800 mg, cold compress.  Sumatriptan helped the migraine but caused chest pain.   Lat year, HA occurred twice a month.     Not associate with menstrual cycle.  When a Headache occurs she takes Zofran and Excedrin with benefit.    Even when no HA, she continues to have some left sided tingling and some headaches but no new symptoms.   It is numb but not painful.  Gait, strength and vision are fine.  Some days, she feels more foggy headed, when a HA occurs.      Due to multiple small round subcortical foci in the head an Echo with bubble contrast was performed.   She was found to have passage of some bubbles 6 beats after injection (most c/w intrapulmonary shunting rather than ASD/PFO)  CT angiogram of the chest showed no evidence of an AVM.  Echocardiogram with bubble contrast showed some bubbles that passed though not certain of the PFO.  She had a TEE 09/26/2021 and there was no evidence of PFO or any other cardiac shunt.  She has no h/o DVT, PE.     Vascular risks:   No cardiac issues except some palpitations,   She does not smoke now but smoked 1/2 - 1 ppd x 5  years.  No DM or HTN.   History of Numbness and studies: She has experienced numbness nad tingling in te left side of her body since 2022.  In the more distant past, she would have episodes of numbness and pian in the left arm but not leg like more recently.     Last week, she had more increased pain in her left side and back and difficulty with vision.   She noted blurriness out of her left eye.   She feels these symptoms are the same today as a few days ago when she went to the ED 08/07/2021.    She last saw ophthalmology Decembr 2022.  At that time the left eye was mildly worse than her right eye (we don;t have records).     She went to the ED October 2022 due to LBP and numbness in the left leg.   Imaging was not performed.   She had a prednisone pack and symptoms improved.      She saw [facility] a couple weeks ago and was told no Ortho issue and that she should see Neuology.      She had migraines as a teenager but no typical migraines x many years.   She reports a chemical exposure last year and had headache  She was a full term twin and was breech and weighted 5  pounds 11 oz   No difficulty with pregnancy or delivery.  She reached all milestones.      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       Imaging: MRI of the brain 08/07/2021 shows numerous T2/FLAIR hyperintense foci in the subcortical and deep white matter.  A couple foci are juxtacortical.  There are only a couple very small periventricular foci and no infratentorial foci.  None of the foci appear to be acute on diffusion-weighted imaging there was no enhancement.  Fourth ventricle larger than typical but adjacent brain is normal and this is unlikely to be significant.  MRI of the cervical spine 08/07/2021 was normal  Transthoracic echocardiogram 08/18/2021 with bubble contrast showed bubbles passing.  They were late (greater than 6 cardiac cycles) more suggestive of intrapulmonary shunt than  PFO.  Transesophageal echo 09/26/2021 showed no evidence of intracardiac shunt or PFO.  CT angiogram of the chest 11/28/2021 showed no evidence of AVM.  Laboratory: CBC and BMP 08/07/2021 were normal 07/11/2020: TB negative, hep B surface antibody positive, varicella IgG positive   REVIEW OF SYSTEMS: Constitutional: No fevers, chills, sweats, or change in appetite Eyes: No visual changes, double vision, eye pain Ear, nose and throat: No hearing loss, ear pain, nasal congestion, sore throat Cardiovascular: No chest pain, palpitations Respiratory:  No shortness of breath at rest or with exertion.   No wheezes GastrointestinaI: No nausea, vomiting, diarrhea, abdominal pain, fecal incontinence Genitourinary:  No dysuria, urinary retention or frequency.  No nocturia. Musculoskeletal:  No neck pain, back pain Integumentary: No rash, pruritus, skin lesions Neurological: as above Psychiatric: No depression at this time.  No anxiety Endocrine: No palpitations, diaphoresis, change in appetite, change in weigh or increased thirst Hematologic/Lymphatic:  No anemia, purpura, petechiae. Allergic/Immunologic: No itchy/runny eyes, nasal congestion, recent allergic reactions, rashes  ALLERGIES: Allergies  Allergen Reactions   Iron Other (See Comments)    constipation    HOME MEDICATIONS:  Current Outpatient Medications:    aspirin-acetaminophen-caffeine (EXCEDRIN MIGRAINE) 250-250-65 MG tablet, Take by mouth every 6 (six) hours as needed for headache., Disp: , Rfl:    Cholecalciferol (VITAMIN D) 50 MCG (2000 UT) CAPS, Take 2,000 Units by mouth daily., Disp: , Rfl:    citalopram (CELEXA) 20 MG tablet, Take 1 tablet (20 mg total) by mouth daily., Disp: 90 tablet, Rfl: 3   levETIRAcetam (KEPPRA) 750 MG tablet, Take 1 tablet (750 mg total) by mouth 2 (two) times daily., Disp: 60 tablet, Rfl: 5   levonorgestrel (MIRENA) 20 MCG/DAY IUD, 1 each by Intrauterine route once., Disp: , Rfl:    magnesium  gluconate (MAGONATE) 500 MG tablet, Take 400 mg by mouth daily., Disp: , Rfl:    Multiple Vitamin (MULTIVITAMIN WITH MINERALS) TABS tablet, Take 2 tablets by mouth daily., Disp: , Rfl:    ondansetron (ZOFRAN-ODT) 4 MG disintegrating tablet, Take 1 tablet by mouth daily as needed, Disp: 15 tablet, Rfl: 2   rizatriptan (MAXALT-MLT) 10 MG disintegrating tablet, Take 1 tablet (10 mg total) by mouth as needed for migraine. May repeat in 2 hours if needed, Disp: 9 tablet, Rfl: 11   amoxicillin-clavulanate (AUGMENTIN) 875-125 MG tablet, Take 1 tablet by mouth 2 (two) times daily. (Patient not taking: Reported on 09/07/2022), Disp: 20 tablet, Rfl: 0   methylPREDNISolone (MEDROL DOSEPAK) 4 MG TBPK tablet, Take per package instructions (Patient not taking: Reported on 09/07/2022), Disp: 21 tablet, Rfl: 0  PAST MEDICAL HISTORY: Past Medical History:  Diagnosis Date  Alopecia    got injections through dermatologist   Migraine    rare    PAST SURGICAL HISTORY: Past Surgical History:  Procedure Laterality Date   BUBBLE STUDY  09/26/2021   Procedure: BUBBLE STUDY;  Surgeon: Meriam Sprague, MD;  Location: [facility] ENDOSCOPY;  Service: Cardiovascular;;   diastasis recti repair     HERNIA REPAIR  2021   umbilical   TEE WITHOUT CARDIOVERSION N/A 09/26/2021   Procedure: TRANSESOPHAGEAL ECHOCARDIOGRAM (TEE);  Surgeon: Meriam Sprague, MD;  Location: [facility] ENDOSCOPY;  Service: Cardiovascular;  Laterality: N/A;    FAMILY HISTORY: Family History  Problem Relation Age of Onset   Healthy Mother    Healthy Father    Heart disease Brother        cardiomegaly   Diabetes Maternal Grandmother    Colon cancer Maternal Grandfather        43s   Hypertension Paternal Grandmother    Lung cancer Paternal Uncle        not a smoker    SOCIAL HISTORY:  Social History   Socioeconomic History   Marital status: Married    Spouse name: Not on file   Number of children: 0   Years of education: Not on file    Highest education level: Not on file  Occupational History   Occupation: registered Academic librarian: [employer]  Tobacco Use   Smoking status: Former    Packs/day: 0.50    Years: 3.00    Additional pack years: 0.00    Total pack years: 1.50    Types: Cigarettes    Quit date: 08/14/2013    Years since quitting: 9.0   Smokeless tobacco: Never   Tobacco comments:    increased to 3 pack/week  Vaping Use   Vaping Use: Never used  Substance and Sexual Activity   Alcohol use: Yes    Comment: Occasional social drinker, every other weekend   Drug use: No   Sexual activity: Yes    Partners: Male    Birth control/protection: Implant  Other Topics Concern   Not on file  Social History Narrative   One boy one girl 6 and 5      Right handed   Coffee- 12 oz per day   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 No pets. 1 son (Dexton) born 10/12/15.  No passive tobacco exposure.    Social Determinants of Health   Financial Resource Strain: Not on file  Food Insecurity: Not on file  Transportation Needs: Not on file  Physical Activity: Not on file  Stress: Not on file  Social Connections: Socially Integrated (12/31/2018)   Social Connection and Isolation Panel [NHANES]    Frequency of Communication with Friends and Family: Three times a week    Frequency of Social Gatherings with Friends and Family: Once a week    Attends Religious Services: More than 4 times per year    Active Member of [employer] or Organizations: Yes    Attends Engineer, structural: More than 4 times per year    Marital Status: Married  Catering manager Violence: Not At Risk (12/31/2018)   Humiliation, Afraid, Rape, and Kick questionnaire    Fear of Current or Ex-Partner: No    Emotionally Abused: No    Physically Abused: No    Sexually Abused: No     PHYSICAL EXAM  Vitals:   09/07/22 0832  BP: 114/70  Pulse: 75  Weight: 178 lb 8  oz (81 kg)  Height: 5\' 6"  (1.676 m)    Body mass index is  28.81 kg/m.   General: The patient is well-developed and well-nourished and in no acute distress.  Neck is nontender with good ROM  HEENT:  Head is [place]/AT.  Sclera are anicteric.  Funduscopic exam is normal  Skin: Extremities are without rash or  edema.  Musculoskeletal:  Back is nontender  Neurologic Exam  Mental status: The patient is alert and oriented x 3 at the time of the examination. The patient has apparent normal recent and remote memory, with an apparently normal attention span and concentration ability.   Speech is normal.  Cranial nerves: Extraocular movements are full.  Facial stregnth and snsation were fine.   No obvious hearing deficits are noted.  Motor:  Muscle bulk is normal.   Tone is normal. Strength is  5 / 5 in all 4 extremities.   Sensory: Sensory testing today was symmetric to temp and vib  Coordination: Cerebellar testing reveals good finger-nose-finger and heel-to-shin bilaterally.  Gait and station: Station is normal.   Gait is normal. Tandem gait is normal. Romberg is negative.   Reflexes: Deep tendon reflexes are symmetric and normal bilaterally.       DIAGNOSTIC DATA (LABS, IMAGING, TESTING) - I reviewed patient records, labs, notes, testing and imaging myself where available.  Lab Results  Component Value Date   WBC 4.2 05/15/2022   HGB 12.7 05/15/2022   HCT 37.6 05/15/2022   MCV 87.2 05/15/2022   PLT 193.0 05/15/2022      Component Value Date/Time   NA 137 09/26/2021 0855   NA 142 12/31/2018 1632   K 4.5 09/26/2021 0855   CL 108 09/26/2021 0855   CO2 24 09/26/2021 0855   GLUCOSE 97 09/26/2021 0855   BUN 10 09/26/2021 0855   BUN 10 12/31/2018 1632   CREATININE 0.69 09/26/2021 0855   CREATININE 0.58 07/19/2014 0001   CALCIUM 9.2 09/26/2021 0855   PROT 6.7 09/26/2021 0855   PROT 6.8 09/27/2017 0916   ALBUMIN 3.9 09/26/2021 0855   ALBUMIN 4.4 09/27/2017 0916   AST 23 09/26/2021 0855   ALT 12 09/26/2021 0855   ALKPHOS 43 09/26/2021  0855   BILITOT 0.9 09/26/2021 0855   BILITOT 0.2 09/27/2017 0916   GFRNONAA >60 09/26/2021 0855   GFRAA >60 08/21/2019 1611   Lab Results  Component Value Date   CHOL 177 11/15/2021   HDL 90.60 11/15/2021   LDLCALC 82 11/15/2021   TRIG 25.0 11/15/2021   CHOLHDL 2 11/15/2021   Lab Results  Component Value Date   HGBA1C 5.6 11/15/2021   Lab Results  Component Value Date   VITAMINB12 638 08/09/2021   Lab Results  Component Value Date   TSH 0.659 08/13/2021       ASSESSMENT AND PLAN  Chronic 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  If not better, consider anti-CGRP agents Her main risk factors for the T2 hyperintense foci on brain MRI would be migraine headache.  She also has about a 5-pack-year history of smoking but has not smoked for many years.  We also discussed that it is possible she was born with those changes. Consider repeat MRI if HA not  better Return in 6 months or sooner if there  are new or worsening neurologic symptoms or based on results of further studies.    Gerard Bonus A. Epimenio Foot, MD, [facility] 09/07/2022, 9:00 AM Certified in Neurology, Clinical Neurophysiology, Sleep Medicine and Neuroimaging  [facility] [address] [place], [place] [address] [phone]

## 2022-09-11 ENCOUNTER — Encounter: Payer: Self-pay | Admitting: Neurology

## 2022-09-12 ENCOUNTER — Other Ambulatory Visit: Payer: Self-pay | Admitting: *Deleted

## 2022-09-12 MED ORDER — RIZATRIPTAN BENZOATE 10 MG PO TABS: 10.0000 mg | ORAL_TABLET | ORAL | 11 refills | Status: DC | PRN

## 2022-09-14 ENCOUNTER — Encounter: Payer: Self-pay | Admitting: Neurology

## 2022-10-23 ENCOUNTER — Encounter: Payer: Self-pay | Admitting: Family

## 2022-10-25 NOTE — Telephone Encounter (Signed)
I spoke with T. Dugal FNP and she decided to wait on sending in albuterol since no noted hx of asthma on pts chart and pt has appt to see Dr Ermalene Searing on 10/26/22 and pt was given UC & ED precautions.

## 2022-10-25 NOTE — Telephone Encounter (Signed)
Unable to reach pt by phone and left v/m for pt to cb. I spoke with [name] (DPR signed) and he said pt is in clinicals today and he will text pt and ask her to call [phone]. Pt had some difficulty in breathing 2 nights ago but did not have problems last night. UC & ED precautions given in v/m. Will wait for pt to call back for triage.

## 2022-10-25 NOTE — Telephone Encounter (Signed)
As I also do not have albuterol on file for her?

## 2022-10-25 NOTE — Telephone Encounter (Signed)
Thank you. I did send in albuterol Dr. Ermalene Searing just FYI before you see her.

## 2022-10-25 NOTE — Telephone Encounter (Signed)
Noted  

## 2022-10-25 NOTE — Telephone Encounter (Signed)
I spoke with Elizabeth Williamson; Elizabeth Williamson said for 1 wk on and off has some chest tightness and irritation in lungs. Elizabeth Williamson denies and SOB,CP,fever, cough. Elizabeth Williamson does have hx of asthma. Elizabeth Williamson did not have any tightness in chest last night. 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

## 2022-10-25 NOTE — Telephone Encounter (Signed)
This should have been sent to triage, it is my first day back in office. Can you call in regards to sob? Pt needs to be seen for this prior to albuterol to r/o anything else.

## 2022-10-26 ENCOUNTER — Encounter: Payer: Self-pay | Admitting: Family Medicine

## 2022-10-26 ENCOUNTER — Ambulatory Visit: Payer: BC Managed Care – PPO | Admitting: Family Medicine

## 2022-10-26 ENCOUNTER — Other Ambulatory Visit: Payer: Self-pay | Admitting: Family Medicine

## 2022-10-26 VITALS — BP 100/70 | HR 61 | Temp 98.4°F | Ht 67.0 in | Wt 178.5 lb

## 2022-10-26 DIAGNOSIS — R0602 Shortness of breath: Secondary | ICD-10-CM

## 2022-10-26 DIAGNOSIS — J4521 Mild intermittent asthma with (acute) exacerbation: Secondary | ICD-10-CM

## 2022-10-26 DIAGNOSIS — K5904 Chronic idiopathic constipation: Secondary | ICD-10-CM | POA: Diagnosis not present

## 2022-10-26 MED ORDER — LACTULOSE 10 GM/15ML PO SOLN: 10.0000 g | Freq: Every day | ORAL | 0 refills | Status: DC | PRN

## 2022-10-26 MED ORDER — LINACLOTIDE 72 MCG PO CAPS: 72.0000 ug | ORAL_CAPSULE | Freq: Every day | ORAL | 5 refills | Status: DC

## 2022-10-26 MED ORDER — ALBUTEROL SULFATE (2.5 MG/3ML) 0.083% IN NEBU: 2.5000 mg | INHALATION_SOLUTION | RESPIRATORY_TRACT | 0 refills | Status: DC | PRN

## 2022-10-26 NOTE — Progress Notes (Signed)
DVT   Patient ID: Elizabeth Williamson, female    DOB: 02-15-1991, 32 y.o.   MRN: [id number]  This visit was conducted in person.  BP 100/70 (BP Location: Left Arm, Patient Position: Sitting, Cuff Size: Normal)   Pulse 61   Temp 98.4 F (36.9 C) (Temporal)   Ht 5\' 7"  (1.702 m)   Wt 178 lb 8 oz (81 kg)   SpO2 100%   BMI 27.96 kg/m    CC:  Chief Complaint  Patient presents with   Shortness of Breath    Felt like she needed her inhaler the last couple of weeks   Bloated    Subjective:   HPI: Elizabeth Williamson is a 32 y.o. female patient of Mort Sawyers with history of allergic rhinitits presenting on 10/26/2022 for Shortness of Breath (Felt like she needed her inhaler the last couple of weeks) and Bloated  Listed past medical history of asthma but patient reports that she was diagnosed with recurrent bronchitis as a teenager.Marland Kitchen exercise induced asthma. She had used inhalers in the past.   None in last 2 years.  She reports in the last couple of weeks she has increased need for her inhaler but does not have them available.  She reports shortness of breath, chest tightness .  She feels like she runs out of breath when talking.  No runny nose, no cough, no congestion. She has had some constipation and bloating in last 2 weeks.  Using daily magnesium, water, fiber, exercising weekly. Has tried Mag citrate, miralax BID not helpful after 7-8 days.  In past she has tried linzess.  Enema did help some earlier this week.   No blood in stool.  Has GI appt next month.    Nml CXR 08/22/2022  Former smoker. 5 years x1 ppd, quit 7 years      Relevant past medical, surgical, family and social history reviewed and updated as indicated. Interim medical history since our last visit reviewed. Allergies and medications reviewed and updated. Outpatient Medications Prior to Visit  Medication Sig Dispense Refill   levETIRAcetam (KEPPRA) 750 MG tablet Take 375 mg by mouth 2 (two) times daily.      aspirin-acetaminophen-caffeine (EXCEDRIN MIGRAINE) 250-250-65 MG tablet Take by mouth every 6 (six) hours as needed for headache.     Cholecalciferol (VITAMIN D) 50 MCG (2000 UT) CAPS Take 2,000 Units by mouth daily.     citalopram (CELEXA) 20 MG tablet Take 1 tablet (20 mg total) by mouth daily. 90 tablet 3   levonorgestrel (MIRENA) 20 MCG/DAY IUD 1 each by Intrauterine route once.     magnesium gluconate (MAGONATE) 500 MG tablet Take 400 mg by mouth daily.     Multiple Vitamin (MULTIVITAMIN WITH MINERALS) TABS tablet Take 2 tablets by mouth daily.     ondansetron (ZOFRAN-ODT) 4 MG disintegrating tablet Take 1 tablet by mouth daily as needed 15 tablet 2   rizatriptan (MAXALT) 10 MG tablet Take 1 tablet (10 mg total) by mouth as needed for migraine. May repeat in 2 hours once if needed. No more than 2 tablets in 24 hours. 9 tablet 11   levETIRAcetam (KEPPRA) 750 MG tablet Take 1 tablet (750 mg total) by mouth 2 (two) times daily. 60 tablet 5   No facility-administered medications prior to visit.     Per HPI unless specifically indicated in ROS section below Review of Systems  Constitutional:  Negative for fatigue and fever.  HENT:  Negative for congestion.  Eyes:  Negative for pain.  Respiratory:  Positive for shortness of breath. Negative for cough.   Cardiovascular:  Negative for chest pain, palpitations and leg swelling.  Gastrointestinal:  Negative for abdominal pain.  Genitourinary:  Negative for dysuria and vaginal bleeding.  Musculoskeletal:  Negative for back pain.  Neurological:  Negative for syncope, light-headedness and headaches.  Psychiatric/Behavioral:  Negative for dysphoric mood.    Objective:  BP 100/70 (BP Location: Left Arm, Patient Position: Sitting, Cuff Size: Normal)   Pulse 61   Temp 98.4 F (36.9 C) (Temporal)   Ht 5\' 7"  (1.702 m)   Wt 178 lb 8 oz (81 kg)   SpO2 100%   BMI 27.96 kg/m   Wt Readings from Last 3 Encounters:  11/01/22 181 lb (82.1 kg)   10/29/22 178 lb 8 oz (81 kg)  10/26/22 178 lb 8 oz (81 kg)      Physical Exam Constitutional:      General: She is not in acute distress.    Appearance: Normal appearance. She is well-developed. She is not ill-appearing or toxic-appearing.  HENT:     Head: Normocephalic.     Right Ear: Hearing, tympanic membrane, ear canal and external ear normal. Tympanic membrane is not erythematous, retracted or bulging.     Left Ear: Hearing, tympanic membrane, ear canal and external ear normal. Tympanic membrane is not erythematous, retracted or bulging.     Nose: No mucosal edema or rhinorrhea.     Right Sinus: No maxillary sinus tenderness or frontal sinus tenderness.     Left Sinus: No maxillary sinus tenderness or frontal sinus tenderness.     Mouth/Throat:     Mouth: Oropharynx is clear and moist and mucous membranes are normal.     Pharynx: Uvula midline.  Eyes:     General: Lids are normal. Lids are everted, no foreign bodies appreciated.     Extraocular Movements: EOM normal.     Conjunctiva/sclera: Conjunctivae normal.     Pupils: Pupils are equal, round, and reactive to light.  Neck:     Thyroid: No thyroid mass or thyromegaly.     Vascular: No carotid bruit.     Trachea: Trachea normal.  Cardiovascular:     Rate and Rhythm: Normal rate and regular rhythm.     Pulses: Normal pulses.     Heart sounds: Normal heart sounds, S1 normal and S2 normal. No murmur heard.    No friction rub. No gallop.  Pulmonary:     Effort: Pulmonary effort is normal. No tachypnea or respiratory distress.     Breath sounds: Normal breath sounds. No decreased breath sounds, wheezing, rhonchi or rales.  Abdominal:     General: Bowel sounds are normal.     Palpations: Abdomen is soft.     Tenderness: There is no abdominal tenderness.  Musculoskeletal:     Cervical back: Normal range of motion and neck supple.  Skin:    General: Skin is warm, dry and intact.     Findings: No rash.  Neurological:      Mental Status: She is alert.  Psychiatric:        Mood and Affect: Mood is not anxious or depressed.        Speech: Speech normal.        Behavior: Behavior normal. Behavior is cooperative.        Thought Content: Thought content normal.        Cognition and Memory: Cognition and memory normal.  Judgment: Judgment normal.       Results for orders placed or performed in visit on 05/15/22  CBC  Result Value Ref Range   WBC 4.2 4.0 - 10.5 K/uL   RBC 4.31 3.87 - 5.11 Mil/uL   Platelets 193.0 150.0 - 400.0 K/uL   Hemoglobin 12.7 12.0 - 15.0 g/dL   HCT 14.7 82.9 - 56.2 %   MCV 87.2 78.0 - 100.0 fl   MCHC 33.7 30.0 - 36.0 g/dL   RDW 13.0 86.5 - 78.4 %  IBC + Ferritin  Result Value Ref Range   Iron 88 42 - 145 ug/dL   Transferrin 696.2 952.8 - 360.0 mg/dL   Saturation Ratios 41.3 20.0 - 50.0 %   Ferritin 75.8 10.0 - 291.0 ng/mL   TIBC 305.2 250.0 - 450.0 mcg/dL    Assessment and Plan  SOB (shortness of breath) Assessment & Plan: Acute, most likely reactive airway from viral infection.  No clear sign of ongoing bacterial infection.  Will try trial of albuterol inhaler as needed for chest tightness and wheezing. If symptoms do not continue to improve we can consider further evaluation.   Chronic idiopathic constipation Assessment & Plan: Chronic, no improvement with fiber and increased water.  No benefit from MiraLAX or Linzess.  Will try course of lactulose.   Other orders -     Lactulose; Take 15 mLs (10 g total) by mouth daily as needed for severe constipation.  Dispense: 236 mL; Refill: 0    No follow-ups on file.   Kerby Nora, MD

## 2022-10-28 NOTE — Telephone Encounter (Signed)
Please submit PA for Linzess

## 2022-10-29 ENCOUNTER — Ambulatory Visit
Admission: EM | Admit: 2022-10-29 | Discharge: 2022-10-29 | Disposition: A | Discharge status: Home / Self Care | Payer: BC Managed Care – PPO | Attending: Urgent Care | Admitting: Urgent Care

## 2022-10-29 ENCOUNTER — Ambulatory Visit (INDEPENDENT_AMBULATORY_CARE_PROVIDER_SITE_OTHER): Payer: BC Managed Care – PPO

## 2022-10-29 DIAGNOSIS — R140 Abdominal distension (gaseous): Secondary | ICD-10-CM

## 2022-10-29 DIAGNOSIS — R109 Unspecified abdominal pain: Secondary | ICD-10-CM | POA: Diagnosis not present

## 2022-10-29 MED ORDER — ALBUTEROL SULFATE HFA 108 (90 BASE) MCG/ACT IN AERS: 2.0000 | INHALATION_SPRAY | Freq: Four times a day (QID) | RESPIRATORY_TRACT | 1 refills | Status: DC | PRN

## 2022-10-29 NOTE — Telephone Encounter (Signed)
Unable to reach pt by phone at only contact # left detailed v/m of Mort Sawyers FNP concerns and instructions., I did speak with [name] (DPR signed) he did not know how pt was breathing at this time and he was advised if pts breathing had worsened for pt to go to [facility] or ED and [name] voiced understanding and  I offerred to schedule appt for pt in AM but he said he would let pt schedule the appt. Sending note to T Alfonse Alpers FNP, [place] pool and Ridgeley CMA.

## 2022-10-29 NOTE — ED Triage Notes (Addendum)
Pt presents to UC for SOB x2 weeks, pt has also had some severe constipation, pt states her "belly is huge" pt states she had a bowel movement on Saturday but still not getting relief. Pt has done an enema, has taken milk of magnesia and nothing is helping. Pt was also seen by her primary on 10/26/22 and prescribed meds but that is not helping either.

## 2022-10-29 NOTE — ED Provider Notes (Signed)
[facility]    CSN: [id number] Arrival date & time: 10/29/22  1656      History   Chief Complaint No chief complaint on file.   HPI Elizabeth Williamson is a 32 y.o. female.   HPI  Presents to urgent care with multiple issues:  - SOB x 2 weeks.  She endorses history of exercise-induced asthma but wonders if the symptoms are related to increased abdominal pressure. Provider gave albuterol renewal.  - Severe "constipation" x 1 month.  She endorses bloating and feels her "belly is huge".  She endorses bowel movement 2 days ago but denies any relief.  She states she has used enema, miralax, milk of magnesia, mag citrate without relief of bloating. She does endorse some BM following these interventions.  Seen by her PCP 3 days ago and prescribed lactulose and Linzess.  She did have a bowel movement after taking lactulose but no improvement in "bloating".  She told her provider that she has used Linzess in the past and it gave her diarrhea. She is still awaiting insurance approval.  Reports drinking a lot of water daily (multiple large water bottles) and a lot of fruits and vegetables. Also using fiber supplements (gummies).  States she has had similar symptoms in the past but was doing well until 1 month ago. Denies increased bowel gas or flatulence.  Review of the patient's chart indicates that she has scheduled visit with GI in 1 month.  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    Past Medical History:  Diagnosis Date   Alopecia    got injections through dermatologist   Migraine    rare    Patient Active Problem List   Diagnosis Date Noted   Acute non-recurrent maxillary sinusitis 08/24/2022   Chronic idiopathic constipation 04/19/2022   White matter abnormality on MRI of brain 02/07/2022   Abnormal echocardiogram 02/07/2022   Dysesthesia 02/07/2022   Iron deficiency anemia 11/07/2021   Vitamin D deficiency 11/07/2021   Family history  of diet-controlled diabetes 11/07/2021   GAD (generalized anxiety disorder) 11/07/2021   Patent foramen ovale with right to left shunt 11/07/2021   Cerebrovascular accident (CVA) due to embolism of carotid artery (HCC) 11/07/2021   Pregnancy 05/30/2016   Allergic rhinitis 05/06/2015   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   Local infection of skin and subcutaneous tissue 03/01/2011    Past Surgical History:  Procedure Laterality Date   BUBBLE STUDY  09/26/2021   Procedure: BUBBLE STUDY;  Surgeon: Meriam Sprague, MD;  Location: [facility] ENDOSCOPY;  Service: Cardiovascular;;   diastasis recti repair     HERNIA REPAIR  2021   umbilical   TEE WITHOUT CARDIOVERSION N/A 09/26/2021   Procedure: TRANSESOPHAGEAL ECHOCARDIOGRAM (TEE);  Surgeon: Meriam Sprague, MD;  Location: [facility] ENDOSCOPY;  Service: Cardiovascular;  Laterality: N/A;    OB History     Gravida  3   Para  2   Term  2   Preterm      AB  1   Living  2      SAB  1   IAB  0   Ectopic      Multiple  0   Live Births  2            Home Medications    Prior to Admission medications   Medication Sig Start Date End Date Taking? Authorizing Provider  albuterol (PROVENTIL) (2.5 MG/3ML) 0.083% nebulizer solution Take 3 mLs (2.5  mg total) by nebulization every 4 (four) hours as needed for wheezing or shortness of breath. 10/26/22   Bedsole, Amy E, MD  albuterol (VENTOLIN HFA) 108 (90 Base) MCG/ACT inhaler Inhale 2 puffs into the lungs every 6 (six) hours as needed for wheezing or shortness of breath. 10/29/22   Mort Sawyers, FNP  aspirin-acetaminophen-caffeine (EXCEDRIN MIGRAINE) [id number] MG tablet Take by mouth every 6 (six) hours as needed for headache.    [provider]  Cholecalciferol (VITAMIN D) 50 MCG (2000 UT) CAPS Take 2,000 Units by mouth daily.    [provider]  citalopram (CELEXA) 20 MG tablet Take 1 tablet (20 mg total) by mouth daily. 05/15/22   Mort Sawyers, FNP   lactulose (CHRONULAC) 10 GM/15ML solution Take 15 mLs (10 g total) by mouth daily as needed for severe constipation. 10/26/22   Bedsole, Amy E, MD  levETIRAcetam (KEPPRA) 750 MG tablet Take 375 mg by mouth 2 (two) times daily.    [provider]  levonorgestrel (MIRENA) 20 MCG/DAY IUD 1 each by Intrauterine route once.    [provider]  linaclotide Karlene Einstein) 72 MCG capsule Take 1 capsule (72 mcg total) by mouth daily before breakfast. 10/26/22   Bedsole, Amy E, MD  magnesium gluconate (MAGONATE) 500 MG tablet Take 400 mg by mouth daily.    [provider]  Multiple Vitamin (MULTIVITAMIN WITH MINERALS) TABS tablet Take 2 tablets by mouth daily.    [provider]  ondansetron (ZOFRAN-ODT) 4 MG disintegrating tablet Take 1 tablet by mouth daily as needed 09/03/22   Sater, Pearletha Furl, MD  rizatriptan (MAXALT) 10 MG tablet Take 1 tablet (10 mg total) by mouth as needed for migraine. May repeat in 2 hours once if needed. No more than 2 tablets in 24 hours. 09/12/22   Sater, Pearletha Furl, MD    Family History Family History  Problem Relation Age of Onset   Healthy Mother    Healthy Father    Heart disease Brother        cardiomegaly   Diabetes Maternal Grandmother    Colon cancer Maternal Grandfather        32s   Hypertension Paternal Grandmother    Lung cancer Paternal Uncle        not a smoker    Social History Social History   Tobacco Use   Smoking status: Former    Current packs/day: 0.00    Average packs/day: 0.5 packs/day for 3.0 years (1.5 ttl pk-yrs)    Types: Cigarettes    Start date: 08/15/2010    Quit date: 08/14/2013    Years since quitting: 9.2   Smokeless tobacco: Never   Tobacco comments:    increased to 3 pack/week  Vaping Use   Vaping status: Never Used  Substance Use Topics   Alcohol use: Yes    Comment: Occasional social drinker, every other weekend   Drug use: No     Allergies   Iron   Review of Systems Review of  Systems   Physical Exam Triage Vital Signs ED Triage Vitals  Encounter Vitals Group     BP      Systolic BP Percentile      Diastolic BP Percentile      Pulse      Resp      Temp      Temp src      SpO2      Weight      Height  Head Circumference      Peak Flow      Pain Score      Pain Loc      Pain Education      Exclude from Growth Chart    No data found.  Updated Vital Signs There were no vitals taken for this visit.  Visual Acuity Right Eye Distance:   Left Eye Distance:   Bilateral Distance:    Right Eye Near:   Left Eye Near:    Bilateral Near:     Physical Exam Vitals reviewed.  Constitutional:      Appearance: Normal appearance.  Abdominal:     General: Abdomen is flat. Bowel sounds are increased. There is no distension.     Palpations: Abdomen is soft.     Tenderness: There is abdominal tenderness in the right upper quadrant, epigastric area and left upper quadrant.     Comments: She endorses generalized abdominal "tightness" but states she has discomfort with palpation in right upper and left upper quadrants and especially epigastric regions of her abdomen.  Skin:    General: Skin is warm and dry.  Neurological:     General: No focal deficit present.     Mental Status: She is alert and oriented to person, place, and time.  Psychiatric:        Mood and Affect: Mood normal.        Behavior: Behavior normal.      UC Treatments / Results  Labs (all labs ordered are listed, but only abnormal results are displayed) Labs Reviewed - No data to display  EKG   Radiology No results found.  Procedures Procedures (including critical care time)  Medications Ordered in UC Medications - No data to display  Initial Impression / Assessment and Plan / UC Course  I have reviewed the triage vital signs and the nursing notes.  Pertinent labs & imaging results that were available during my care of the patient were reviewed by me and considered in  my medical decision making (see chart for details).   Elizabeth Williamson is a 31 y.o. female presenting with "bloating" and SOB. Patient is afebrile without recent antipyretics, satting well on room air. Overall is well appearing, well hydrated, without respiratory distress.   Abdomen is soft and does not appear distended.  She endorses generalized abdominal "tightness" but states she has discomfort with palpation in right upper and left upper quadrants and especially epigastric regions of her abdomen.  Reviewed relevant chart history.   Abdominal xray obtained which shows:  IMPRESSION: Moderate stool burden.  No abnormal bowel dilatation.  Unclear etiology for her symptoms.  While the x-ray does show a moderate stool burden, it is unclear if this is causing the symptoms that she is complaining of.  I am unsure of the cause of her initial symptoms, but suspect that her multiple interventions may be contributing to her ongoing symptoms.  Recommend more conservative treatment with daily use of a single dose of MiraLAX which I think will clear any excess stool burden over time.  We also discussed researching FODMAP diet to reduce any foods that may be producing abnormal abdominal gas.  Suggest doing a diet audit to identify any foods that may contribute to her symptoms and then eliminate them.  She should follow-up with GI at her scheduled appointment.  Counseled patient on potential for adverse effects with medications prescribed/recommended today, ER and return-to-clinic precautions discussed, patient verbalized understanding and agreement with care plan.  Final Clinical Impressions(s) / UC Diagnoses   Final diagnoses:  None   Discharge Instructions   None    ED Prescriptions   None    PDMP not reviewed this encounter.   Charma Igo, [place] 10/29/22 [phone]

## 2022-10-29 NOTE — Discharge Instructions (Signed)
The results of your abdominal x-ray shows a moderate stool burden.  It does not show any excessive dilatation of your intestines.  I suspect that your ongoing symptoms may be related to your frequent interventions and suggest that you use only MiraLAX daily until you have consistent soft BMs.  If you develop diarrhea, reduce the frequency or dose.  I would also suggest that you research a FODMAP diet as the foods that you are consuming may be producing excessive abdominal gas.  Please follow-up at your scheduled GI appointment for additional evaluation and recommended interventions.

## 2022-10-29 NOTE — Telephone Encounter (Signed)
Can we call pt and see level of 'breathing' if it has worsened, changed since her visit with Dr. Ermalene Searing. If worsening today need more urgent care, however it looks like I have an 1140 slot tomorrow that I would be ok with her taking if she can wait that long.

## 2022-10-29 NOTE — Telephone Encounter (Signed)
Patient is asking for an inhaler and not nebulizer solution.  Will sent to PCP to see if she is agreeable to prescribe since Dr. Ermalene Searing is out of the office this week.  Called [facility] to cancel nebulizer solution Rx but pharmacy states patient has already picked it up.

## 2022-10-30 ENCOUNTER — Other Ambulatory Visit (HOSPITAL_COMMUNITY): Payer: Self-pay

## 2022-10-30 ENCOUNTER — Telehealth: Payer: Self-pay

## 2022-10-30 NOTE — Telephone Encounter (Signed)
.  OPSPharmacy Patient Advocate Encounter   Received notification from RX Request Messages that prior authorization for Linzess is required/requested.   Insurance verification completed.   The patient is insured through [employer] .   Per test claim: PA submitted to [facility] via CoverMyMeds Key/confirmation #/EOC [id number] Status is pending

## 2022-10-30 NOTE — Telephone Encounter (Signed)
 LMTCB

## 2022-10-30 NOTE — Telephone Encounter (Signed)
Pharmacy Patient Advocate Encounter   Received notification from RX Request Messages that prior authorization for Linzess caps is required/requested.   Insurance verification completed.   The patient is insured through [employer].   Per test claim: PA started via CoverMyMeds. KEY [id number] . Waiting for clinical questions to populate.

## 2022-10-31 ENCOUNTER — Ambulatory Visit: Payer: BC Managed Care – PPO | Admitting: Family

## 2022-10-31 NOTE — Telephone Encounter (Signed)
Patient returned call and said that she is willing to come in tomorrow to see tabitha at 1pm.

## 2022-10-31 NOTE — Telephone Encounter (Signed)
I have left message for Elizabeth Williamson that Elizabeth Williamson would like to see her tomorrow 11/01/2022 at 1:00 pm if she is able.  I ask that she call the office back or respond back to Elizabeth Williamson's MyChart message letting us know if she is able to come in tomorrow to be seen.   PA was submitted for the Linzess on 10/29/2022.  Awaiting response from insurance.

## 2022-10-31 NOTE — Telephone Encounter (Signed)
Putting my pool back into this thread just in case she messages back that she is ok with 1 pm tomorrow (I told her I would open up the schedule)

## 2022-10-31 NOTE — Telephone Encounter (Signed)
Noted.  Elizabeth Williamson is adding her to Tabitha's schedule tomorrow at 1:00 pm.

## 2022-11-01 ENCOUNTER — Encounter: Payer: Self-pay | Admitting: Family

## 2022-11-01 ENCOUNTER — Ambulatory Visit: Payer: BC Managed Care – PPO | Admitting: Family

## 2022-11-01 VITALS — BP 90/70 | HR 74 | Temp 98.5°F | Ht 67.0 in | Wt 181.0 lb

## 2022-11-01 DIAGNOSIS — R1906 Epigastric swelling, mass or lump: Secondary | ICD-10-CM | POA: Insufficient documentation

## 2022-11-01 DIAGNOSIS — R1013 Epigastric pain: Secondary | ICD-10-CM | POA: Diagnosis not present

## 2022-11-01 DIAGNOSIS — R110 Nausea: Secondary | ICD-10-CM

## 2022-11-01 DIAGNOSIS — K5904 Chronic idiopathic constipation: Secondary | ICD-10-CM

## 2022-11-01 DIAGNOSIS — K5909 Other constipation: Secondary | ICD-10-CM

## 2022-11-01 LAB — CBC WITH DIFFERENTIAL/PLATELET
Basophils Absolute: 0 10*3/uL (ref 0.0–0.1)
Basophils Relative: 0.9 % (ref 0.0–3.0)
Eosinophils Absolute: 0.2 10*3/uL (ref 0.0–0.7)
Eosinophils Relative: 3.8 % (ref 0.0–5.0)
HCT: 38.1 % (ref 36.0–46.0)
Hemoglobin: 12 g/dL (ref 12.0–15.0)
Lymphocytes Relative: 51.7 % — ABNORMAL HIGH (ref 12.0–46.0)
Lymphs Abs: 2.4 10*3/uL (ref 0.7–4.0)
MCHC: 31.6 g/dL (ref 30.0–36.0)
MCV: 88.9 fl (ref 78.0–100.0)
Monocytes Absolute: 0.3 10*3/uL (ref 0.1–1.0)
Monocytes Relative: 6 % (ref 3.0–12.0)
Neutro Abs: 1.7 10*3/uL (ref 1.4–7.7)
Neutrophils Relative %: 37.6 % — ABNORMAL LOW (ref 43.0–77.0)
Platelets: 198 10*3/uL (ref 150.0–400.0)
RBC: 4.29 Mil/uL (ref 3.87–5.11)
RDW: 13.1 % (ref 11.5–15.5)
WBC: 4.6 10*3/uL (ref 4.0–10.5)

## 2022-11-01 LAB — COMPREHENSIVE METABOLIC PANEL
ALT: 17 U/L (ref 0–35)
AST: 19 U/L (ref 0–37)
Albumin: 4.7 g/dL (ref 3.5–5.2)
Alkaline Phosphatase: 54 U/L (ref 39–117)
BUN: 13 mg/dL (ref 6–23)
CO2: 29 mEq/L (ref 19–32)
Calcium: 9.8 mg/dL (ref 8.4–10.5)
Chloride: 101 mEq/L (ref 96–112)
Creatinine, Ser: 0.73 mg/dL (ref 0.40–1.20)
GFR: 107.44 mL/min (ref >60.00)
Glucose, Bld: 137 mg/dL — ABNORMAL HIGH (ref 70–99)
Potassium: 4 mEq/L (ref 3.5–5.1)
Sodium: 136 mEq/L (ref 135–145)
Total Bilirubin: 0.3 mg/dL (ref 0.2–1.2)
Total Protein: 7.4 g/dL (ref 6.0–8.3)

## 2022-11-01 LAB — LIPASE: Lipase: 37 U/L (ref 11.0–59.0)

## 2022-11-01 LAB — AMYLASE: Amylase: 64 U/L (ref 27–131)

## 2022-11-01 NOTE — Assessment & Plan Note (Signed)
Low suspicion for cholecystitis as without ruq abd pain and - murphys however will order LFTs cbc to r/o infection, pancreatic or liver concern.

## 2022-11-01 NOTE — Assessment & Plan Note (Signed)
R/o stool mass, abdominal intestinal mass, IBD and or hernia  Stat ct abd pelvis ordered Advised red flag symptoms and when to go to the ER Labs ordered pending results

## 2022-11-01 NOTE — Progress Notes (Signed)
Established Patient Office Visit  Subjective:      CC:  Chief Complaint  Patient presents with   Constipation    HPI: Elizabeth Williamson is a 32 y.o. female presenting on 11/01/2022 for Constipation . Pt here as she has still not had a bowel movement since Saturday.  She did try magnesium citrate last night without any success. She tried lactolose again on Tuesday.  She still feels bloated, nauseous, and very tender at the epigastric area.  She is not throwing up.  No diarrhea or anything such as that. Sob has improved, no longer using the albuterol as much as she had been at initial onset of symptoms.   She is not taking linzess because it was not approved with her insurance .  Was given RX for lactulose Friday and had the bowel movement Saturday however prior to this   She is eating regular without an issue.  No bloating or pain after eating.   7/12 went in with initial complaints. Seen by Dr Ermalene Searing. She was starting with sob and very bloated. At that time was using daily magnesium, water, fiber and exercising regularly. Had also tried mag citrate, miralax BID which was not helping. Enema did help slightly prior but not a lot.  No blood in the stool. +  7/15 went to [facility] urgent care for ongoing constipation and sob in relation to bloating. They did abd xray which confirmed moderate stool burden. They then recommended more conservative tx with daily use of miralax. Was advised to pay attention to fodmap foods.   Pt states tested negative for pregnancy 7/15    Social history:  Relevant past medical, surgical, family and social history reviewed and updated as indicated. Interim medical history since our last visit reviewed.  Allergies and medications reviewed and updated.  DATA REVIEWED: CHART IN EPIC     ROS: Negative unless specifically indicated above in HPI.    Current Outpatient Medications:    albuterol (VENTOLIN HFA) 108 (90 Base) MCG/ACT inhaler, Inhale  2 puffs into the lungs every 6 (six) hours as needed for wheezing or shortness of breath., Disp: 8 g, Rfl: 1   aspirin-acetaminophen-caffeine (EXCEDRIN MIGRAINE) 250-250-65 MG tablet, Take by mouth every 6 (six) hours as needed for headache., Disp: , Rfl:    Cholecalciferol (VITAMIN D) 50 MCG (2000 UT) CAPS, Take 2,000 Units by mouth daily., Disp: , Rfl:    citalopram (CELEXA) 20 MG tablet, Take 1 tablet (20 mg total) by mouth daily., Disp: 90 tablet, Rfl: 3   lactulose (CHRONULAC) 10 GM/15ML solution, Take 15 mLs (10 g total) by mouth daily as needed for severe constipation., Disp: 236 mL, Rfl: 0   levETIRAcetam (KEPPRA) 750 MG tablet, Take 375 mg by mouth 2 (two) times daily., Disp: , Rfl:    levonorgestrel (MIRENA) 20 MCG/DAY IUD, 1 each by Intrauterine route once., Disp: , Rfl:    linaclotide (LINZESS) 72 MCG capsule, Take 1 capsule (72 mcg total) by mouth daily before breakfast., Disp: 30 capsule, Rfl: 5   magnesium gluconate (MAGONATE) 500 MG tablet, Take 400 mg by mouth daily., Disp: , Rfl:    Multiple Vitamin (MULTIVITAMIN WITH MINERALS) TABS tablet, Take 2 tablets by mouth daily., Disp: , Rfl:    ondansetron (ZOFRAN-ODT) 4 MG disintegrating tablet, Take 1 tablet by mouth daily as needed, Disp: 15 tablet, Rfl: 2   rizatriptan (MAXALT) 10 MG tablet, Take 1 tablet (10 mg total) by mouth as needed for migraine. May repeat  in 2 hours once if needed. No more than 2 tablets in 24 hours., Disp: 9 tablet, Rfl: 11      Objective:    BP 90/70 (BP Location: Left Arm, Patient Position: Sitting, Cuff Size: Large)   Pulse 74   Temp 98.5 F (36.9 C) (Temporal)   Ht 5\' 7"  (1.702 m)   Wt 181 lb (82.1 kg)   SpO2 98%   BMI 28.35 kg/m   Wt Readings from Last 3 Encounters:  11/01/22 181 lb (82.1 kg)  10/29/22 178 lb 8 oz (81 kg)  10/26/22 178 lb 8 oz (81 kg)    Physical Exam Constitutional:      General: She is not in acute distress.    Appearance: Normal appearance. She is normal weight. She  is not ill-appearing, toxic-appearing or diaphoretic.  HENT:     Head: Normocephalic.  Cardiovascular:     Rate and Rhythm: Normal rate and regular rhythm.  Pulmonary:     Effort: Pulmonary effort is normal.     Breath sounds: Normal breath sounds.  Abdominal:     General: Bowel sounds are normal. There is distension (epigastric).     Palpations: There is mass (mid left epigastric area with tender mass).     Tenderness: There is abdominal tenderness. There is no guarding or rebound.  Musculoskeletal:        General: Normal range of motion.  Neurological:     General: No focal deficit present.     Mental Status: She is alert and oriented to person, place, and time. Mental status is at baseline.  Psychiatric:        Mood and Affect: Mood normal.        Behavior: Behavior normal.        Thought Content: Thought content normal.        Judgment: Judgment normal.           Assessment & Plan:  Epigastric pain Assessment & Plan: Low suspicion for cholecystitis as without ruq abd pain and - murphys however will order LFTs cbc to r/o infection, pancreatic or liver concern.   Orders: -     CT ABDOMEN W CONTRAST; Future -     CBC with Differential/Platelet -     Amylase -     Lipase -     Comprehensive metabolic panel  Epigastric mass Assessment & Plan: R/o stool mass, abdominal intestinal mass, IBD and or hernia  Stat ct abd pelvis ordered Advised red flag symptoms and when to go to the ER Labs ordered pending results  Orders: -     CT ABDOMEN W CONTRAST; Future -     CBC with Differential/Platelet -     Amylase -     Lipase -     Comprehensive metabolic panel  Other constipation -     CT ABDOMEN W CONTRAST; Future  Nausea -     CT ABDOMEN W CONTRAST; Future  Chronic idiopathic constipation Assessment & Plan: Acute on chronic, complicated.  Trial failed on enema, laxatives, suppositories, mag no relief.  Ordering CT abd pelvis to r/o  Did advise her if any acute  worsening abdominal pain, vomiting, fever go directly to the ER.      Return if symptoms worsen or fail to improve.  Mort Sawyers, MSN, APRN, FNP-C [facility]

## 2022-11-01 NOTE — Assessment & Plan Note (Signed)
Acute on chronic, complicated.  Trial failed on enema, laxatives, suppositories, mag no relief.  Ordering CT abd pelvis to r/o  Did advise her if any acute worsening abdominal pain, vomiting, fever go directly to the ER.

## 2022-11-02 ENCOUNTER — Telehealth: Payer: Self-pay

## 2022-11-02 ENCOUNTER — Ambulatory Visit
Admission: RE | Admit: 2022-11-02 | Discharge: 2022-11-02 | Disposition: A | Discharge status: Home / Self Care | Payer: BC Managed Care – PPO | Source: Ambulatory Visit | Attending: Family | Admitting: Family

## 2022-11-02 ENCOUNTER — Other Ambulatory Visit (HOSPITAL_COMMUNITY): Payer: Self-pay

## 2022-11-02 ENCOUNTER — Telehealth: Payer: Self-pay | Admitting: Family

## 2022-11-02 DIAGNOSIS — R1013 Epigastric pain: Secondary | ICD-10-CM | POA: Diagnosis not present

## 2022-11-02 DIAGNOSIS — R1900 Intra-abdominal and pelvic swelling, mass and lump, unspecified site: Secondary | ICD-10-CM | POA: Diagnosis not present

## 2022-11-02 DIAGNOSIS — K5909 Other constipation: Secondary | ICD-10-CM

## 2022-11-02 DIAGNOSIS — K5904 Chronic idiopathic constipation: Secondary | ICD-10-CM

## 2022-11-02 DIAGNOSIS — R1906 Epigastric swelling, mass or lump: Secondary | ICD-10-CM

## 2022-11-02 DIAGNOSIS — R110 Nausea: Secondary | ICD-10-CM

## 2022-11-02 MED ORDER — TRULANCE 3 MG PO TABS: 3.0000 mg | ORAL_TABLET | Freq: Every day | ORAL | 0 refills | Status: DC

## 2022-11-02 MED ORDER — IOPAMIDOL (ISOVUE-300) INJECTION 61%
100.0000 mL | Freq: Once | INTRAVENOUS | Status: AC | PRN
  Administered 2022-11-02: 100 mL via INTRAVENOUS

## 2022-11-02 NOTE — Telephone Encounter (Signed)
LaTara from [facility] called in and stated that linaclotide (LINZESS) 72 MCG capsule was denied. She stated that it was denied because the patient hasn't tried Trulance and Lubiprostone. She stated that if there is any reason she can't they need that information. She stated that she can try a sample of both and if they don't work they will accept that. Thank you!

## 2022-11-02 NOTE — Telephone Encounter (Signed)
Tried to rx linzess, pt called insurance they denied linzess because they wanted her to try trulance. Now putting in rx for trulance noting that p/a is needed.

## 2022-11-02 NOTE — Telephone Encounter (Signed)
Pharmacy Patient Advocate Encounter   Received notification from Pt Calls Messages that prior authorization for Trulance 3mg  tabs is required/requested.   Insurance verification completed.   The patient is insured through [facility] .   Per test claim: PA started via CoverMyMeds. KEY [id number] . Waiting for clinical questions to populate.

## 2022-11-02 NOTE — Addendum Note (Signed)
Addended by: Mort Sawyers on: 11/02/2022 11:14 AM   Modules accepted: Orders

## 2022-11-05 ENCOUNTER — Other Ambulatory Visit (HOSPITAL_COMMUNITY): Payer: Self-pay

## 2022-11-05 ENCOUNTER — Encounter: Payer: Self-pay | Admitting: Family

## 2022-11-05 ENCOUNTER — Ambulatory Visit: Payer: BC Managed Care – PPO | Admitting: Family

## 2022-11-05 NOTE — Telephone Encounter (Signed)
Pharmacy Patient Advocate Encounter  Received notification from [facility] that Prior Authorization for Trulance 3MG  tablets has been APPROVED from 11/05/22 to 11/05/23.Marland Kitchen  PA #/Case ID/Reference #: [id number]   Copay is $25 for 90 day supply with eVoucher

## 2022-11-05 NOTE — Telephone Encounter (Signed)
Prior authorization for Trulaance 3mg  has been submitted to [place]

## 2022-11-05 NOTE — Telephone Encounter (Signed)
Pharmacy Patient Advocate Encounter  Received notification from [facility] that Prior Authorization for Linzess has been DENIED because this medication is not on the formulary an is approved for members who have long-term constipation of unknown causes (chronic idiopathic constipation) The member must try and fail or unable ito take all formulary alternatives one of which is Trulance..  PA #/Case ID/Reference #: [id number]  Denial letter indexed to chart.

## 2022-11-05 NOTE — Telephone Encounter (Signed)
Trulance has been ordered pending p/a

## 2022-11-09 ENCOUNTER — Encounter: Payer: Self-pay | Admitting: Neurology

## 2022-11-14 DIAGNOSIS — R0602 Shortness of breath: Secondary | ICD-10-CM | POA: Insufficient documentation

## 2022-11-14 NOTE — Assessment & Plan Note (Signed)
Acute, most likely reactive airway from viral infection.  No clear sign of ongoing bacterial infection.  Will try trial of albuterol inhaler as needed for chest tightness and wheezing. If symptoms do not continue to improve we can consider further evaluation.

## 2022-11-14 NOTE — Assessment & Plan Note (Addendum)
Chronic, no improvement with fiber and increased water.  No benefit from MiraLAX .  Will try course of lactulose to acutely treat.  Will try trial of Linzess if covered by insurance.

## 2022-11-19 ENCOUNTER — Other Ambulatory Visit (HOSPITAL_COMMUNITY): Payer: Self-pay

## 2022-11-19 ENCOUNTER — Telehealth: Payer: Self-pay

## 2022-11-19 ENCOUNTER — Other Ambulatory Visit: Payer: Self-pay | Admitting: Neurology

## 2022-11-19 ENCOUNTER — Telehealth: Payer: Self-pay | Admitting: *Deleted

## 2022-11-19 DIAGNOSIS — G43709 Chronic migraine without aura, not intractable, without status migrainosus: Secondary | ICD-10-CM

## 2022-11-19 DIAGNOSIS — R200 Anesthesia of skin: Secondary | ICD-10-CM

## 2022-11-19 DIAGNOSIS — R9082 White matter disease, unspecified: Secondary | ICD-10-CM

## 2022-11-19 MED ORDER — NURTEC 75 MG PO TBDP: ORAL_TABLET | ORAL | 5 refills | Status: DC

## 2022-11-19 NOTE — Telephone Encounter (Signed)
PA request sent to PA team

## 2022-11-19 NOTE — Telephone Encounter (Signed)
PA request has been Submitted. New Encounter created for follow up. For additional info see Pharmacy Prior Auth telephone encounter from 11/19/2022.

## 2022-11-19 NOTE — Telephone Encounter (Signed)
Pharmacy Patient Advocate Encounter   Received notification from Physician's Office that prior authorization for Nurtec 75MG  dispersible tablets is required/requested.   Insurance verification completed.   The patient is insured through [facility] .   Per test claim: PA required; PA started via CoverMyMeds. KEY [id number] . Waiting for clinical questions to populate.

## 2022-11-20 NOTE — Telephone Encounter (Signed)
Insurance could not find member on CMM-Submitted [employer] PA form and faxed along with clinicals to [phone].

## 2022-11-27 ENCOUNTER — Ambulatory Visit (INDEPENDENT_AMBULATORY_CARE_PROVIDER_SITE_OTHER): Payer: BC Managed Care – PPO

## 2022-11-27 DIAGNOSIS — K5904 Chronic idiopathic constipation: Secondary | ICD-10-CM | POA: Diagnosis not present

## 2022-11-27 DIAGNOSIS — G43709 Chronic migraine without aura, not intractable, without status migrainosus: Secondary | ICD-10-CM

## 2022-11-27 DIAGNOSIS — R200 Anesthesia of skin: Secondary | ICD-10-CM

## 2022-11-27 DIAGNOSIS — R110 Nausea: Secondary | ICD-10-CM | POA: Diagnosis not present

## 2022-11-27 DIAGNOSIS — D509 Iron deficiency anemia, unspecified: Secondary | ICD-10-CM | POA: Diagnosis not present

## 2022-11-27 DIAGNOSIS — R9082 White matter disease, unspecified: Secondary | ICD-10-CM

## 2022-11-27 DIAGNOSIS — R1013 Epigastric pain: Secondary | ICD-10-CM | POA: Diagnosis not present

## 2022-11-27 MED ORDER — GADOBENATE DIMEGLUMINE 529 MG/ML IV SOLN
17.0000 mL | Freq: Once | INTRAVENOUS | Status: AC | PRN
  Administered 2022-11-27: 17 mL via INTRAVENOUS

## 2022-11-27 NOTE — Telephone Encounter (Signed)
Received a PA Form for Initial Coverage for BC[employer]-Completed form and faxed it along with clinicals to [phone]. Awaiting determination.

## 2022-11-28 ENCOUNTER — Encounter: Payer: Self-pay | Admitting: Neurology

## 2022-11-28 ENCOUNTER — Telehealth: Payer: Self-pay

## 2022-11-28 DIAGNOSIS — G43709 Chronic migraine without aura, not intractable, without status migrainosus: Secondary | ICD-10-CM

## 2022-11-28 NOTE — Telephone Encounter (Signed)
 PA request has been Submitted. New Encounter created for follow up. For additional info see Pharmacy Prior Auth telephone encounter from 11/19/2022.

## 2022-11-28 NOTE — Telephone Encounter (Signed)
Pa needed for nurtec

## 2022-11-29 MED ORDER — UBRELVY 100 MG PO TABS: ORAL_TABLET | ORAL | 5 refills | Status: AC

## 2022-11-29 NOTE — Telephone Encounter (Signed)
Stacy from [employer] on behalf of [employer] has called to informed there is a denial for the Nurtec.  A separate PA will be needed for Ubrelvy.  A fax will be sent later today re: the denial.  The callback # is [phone] option 3

## 2022-11-29 NOTE — Telephone Encounter (Signed)
Rx sent to pharmacy message sent to PA team.

## 2022-11-29 NOTE — Telephone Encounter (Signed)
1st attempt: lvm   When she calls back ask if agreeable to ubrelvy 100mg  since insurance denied nurtec unless she tries [place]. If agreeable, please send rx, and send msg to rxpa team to start pa.

## 2022-11-29 NOTE — Telephone Encounter (Signed)
This is pending, message left for patient to call on another encounter, if patient is agreeable . Response will be sent to PA team for Ubrelvy.

## 2022-11-29 NOTE — Addendum Note (Signed)
Addended by: Aura Camps on: 11/29/2022 04:46 PM   Modules accepted: Orders

## 2022-11-29 NOTE — Telephone Encounter (Signed)
Pharmacy Patient Advocate Encounter  Received notification from [facility] that Prior Authorization for Nurtec 75MG  dispersible tablets has been DENIED. Please advise how you'd like to proceed. Full denial letter will be uploaded to the media tab. See denial reason below.  This medication is not on the formulary The member must try and fail (did not work), or be unstable to take ALL formulary alternatives (due to interactions, side effects, etc). In this case, there is only one alternative that needs to be tried: [place]. Please note: Bernita Raisin requires a separate prior approval request  PA #/Case ID/Reference #: [id number]

## 2022-11-29 NOTE — Telephone Encounter (Signed)
Per Dr. Epimenio Foot  "Is okay for her to try Ubrelvy 100 mg instead of Nurtec"

## 2022-11-29 NOTE — Telephone Encounter (Signed)
Dr. Epimenio Foot,  The denial letter (scanned in under media) stated that she needs to try ubrelvy. Can we proceed w/that? If so, I will let pa team know to start pa.  Thanks,  Production assistant, radio

## 2022-11-29 NOTE — Telephone Encounter (Signed)
Pt returned phone call. Relayed message, pt said that would be fine for prescription for Ubrelvy.

## 2022-11-29 NOTE — Addendum Note (Signed)
Addended by: Aura Camps on: 11/29/2022 03:00 PM   Modules accepted: Orders

## 2022-11-29 NOTE — Telephone Encounter (Addendum)
Rx sent for Ubrelvy, you may proceed with PA.

## 2022-11-30 NOTE — Telephone Encounter (Signed)
After patient has used medication more than once, please either have the patient come in or call so that new documentation can be submitted that includes patient's response to this therapy.

## 2022-12-07 ENCOUNTER — Telehealth: Payer: Self-pay

## 2022-12-07 ENCOUNTER — Other Ambulatory Visit (HOSPITAL_COMMUNITY): Payer: Self-pay

## 2022-12-07 NOTE — Telephone Encounter (Signed)
Pharmacy Patient Advocate Encounter   Received notification from CoverMyMeds that prior authorization for Ubrelvy 100MG  tablets is required/requested.   Insurance verification completed.   The patient is insured through [facility] .   Per test claim: PA required; PA started via CoverMyMeds. KEY [id number] . Waiting for clinical questions to populate.

## 2022-12-14 NOTE — Telephone Encounter (Signed)
Pharmacy Patient Advocate Encounter   Received notification from Physician's Office that prior authorization for Ubrelvy 100MG  Tablet is required/requested.   Insurance verification completed.   The patient is insured through [facility] .  Submitted BCBS PA Form and faxed to [phone]

## 2022-12-19 ENCOUNTER — Other Ambulatory Visit (HOSPITAL_COMMUNITY): Payer: Self-pay

## 2022-12-19 NOTE — Telephone Encounter (Signed)
Pharmacy Patient Advocate Encounter  Received notification from [facility] that Prior Authorization for Ubrelvy 100MG  Tablet has been APPROVED from 12/14/2022 to 03/08/2023   PA #/Case ID/Reference #: [id number]

## 2022-12-19 NOTE — Telephone Encounter (Signed)
 Phone room please relay medication has been approved.

## 2022-12-21 ENCOUNTER — Encounter: Payer: Self-pay | Admitting: Neurology

## 2022-12-28 DIAGNOSIS — D509 Iron deficiency anemia, unspecified: Secondary | ICD-10-CM | POA: Diagnosis not present

## 2023-01-30 ENCOUNTER — Telehealth: Payer: Self-pay | Admitting: Neurology

## 2023-01-30 NOTE — Telephone Encounter (Signed)
LVM and sent mychart msg informing pt of need to reschedule 02/07/23 appt - MD out

## 2023-02-07 ENCOUNTER — Ambulatory Visit: Payer: BC Managed Care – PPO | Admitting: Neurology

## 2023-02-13 ENCOUNTER — Encounter: Payer: Self-pay | Admitting: Family

## 2023-02-13 DIAGNOSIS — Z111 Encounter for screening for respiratory tuberculosis: Secondary | ICD-10-CM

## 2023-02-14 DIAGNOSIS — Z309 Encounter for contraceptive management, unspecified: Secondary | ICD-10-CM | POA: Diagnosis not present

## 2023-02-14 NOTE — Addendum Note (Signed)
Addended by: Mort Sawyers on: 02/14/2023 11:44 AM   Modules accepted: Orders

## 2023-02-26 ENCOUNTER — Encounter: Payer: Self-pay | Admitting: Family

## 2023-04-25 ENCOUNTER — Other Ambulatory Visit: Payer: Self-pay | Admitting: Family

## 2023-04-25 DIAGNOSIS — F411 Generalized anxiety disorder: Secondary | ICD-10-CM

## 2023-05-20 DIAGNOSIS — K2950 Unspecified chronic gastritis without bleeding: Secondary | ICD-10-CM | POA: Diagnosis not present

## 2023-05-20 DIAGNOSIS — D509 Iron deficiency anemia, unspecified: Secondary | ICD-10-CM | POA: Diagnosis not present

## 2023-05-20 DIAGNOSIS — R1013 Epigastric pain: Secondary | ICD-10-CM | POA: Diagnosis not present

## 2023-05-20 DIAGNOSIS — K2970 Gastritis, unspecified, without bleeding: Secondary | ICD-10-CM | POA: Diagnosis not present

## 2023-05-20 DIAGNOSIS — K5904 Chronic idiopathic constipation: Secondary | ICD-10-CM | POA: Diagnosis not present

## 2023-05-20 DIAGNOSIS — R110 Nausea: Secondary | ICD-10-CM | POA: Diagnosis not present

## 2023-05-20 DIAGNOSIS — Q432 Other congenital functional disorders of colon: Secondary | ICD-10-CM | POA: Diagnosis not present

## 2023-05-20 DIAGNOSIS — K648 Other hemorrhoids: Secondary | ICD-10-CM | POA: Diagnosis not present

## 2023-07-08 DIAGNOSIS — R9431 Abnormal electrocardiogram [ECG] [EKG]: Secondary | ICD-10-CM | POA: Diagnosis not present

## 2023-07-08 DIAGNOSIS — R0602 Shortness of breath: Secondary | ICD-10-CM | POA: Diagnosis not present

## 2023-07-08 DIAGNOSIS — R200 Anesthesia of skin: Secondary | ICD-10-CM | POA: Diagnosis not present

## 2023-07-08 DIAGNOSIS — R519 Headache, unspecified: Secondary | ICD-10-CM | POA: Diagnosis not present

## 2023-07-08 DIAGNOSIS — R42 Dizziness and giddiness: Secondary | ICD-10-CM | POA: Diagnosis not present

## 2023-07-09 ENCOUNTER — Encounter: Payer: Self-pay | Admitting: Neurology

## 2023-07-09 ENCOUNTER — Encounter: Payer: Self-pay | Admitting: Family

## 2023-07-09 ENCOUNTER — Telehealth: Payer: Self-pay | Admitting: Neurology

## 2023-07-09 ENCOUNTER — Ambulatory Visit (INDEPENDENT_AMBULATORY_CARE_PROVIDER_SITE_OTHER): Admitting: Neurology

## 2023-07-09 ENCOUNTER — Ambulatory Visit: Admitting: Family

## 2023-07-09 VITALS — BP 110/72 | HR 75 | Temp 98.0°F | Ht 67.0 in | Wt 183.6 lb

## 2023-07-09 VITALS — BP 115/71 | HR 76 | Ht 67.0 in | Wt 182.5 lb

## 2023-07-09 DIAGNOSIS — R0602 Shortness of breath: Secondary | ICD-10-CM | POA: Diagnosis not present

## 2023-07-09 DIAGNOSIS — R208 Other disturbances of skin sensation: Secondary | ICD-10-CM

## 2023-07-09 DIAGNOSIS — G43109 Migraine with aura, not intractable, without status migrainosus: Secondary | ICD-10-CM

## 2023-07-09 DIAGNOSIS — R519 Headache, unspecified: Secondary | ICD-10-CM

## 2023-07-09 DIAGNOSIS — R9082 White matter disease, unspecified: Secondary | ICD-10-CM

## 2023-07-09 DIAGNOSIS — R200 Anesthesia of skin: Secondary | ICD-10-CM | POA: Diagnosis not present

## 2023-07-09 DIAGNOSIS — R0609 Other forms of dyspnea: Secondary | ICD-10-CM

## 2023-07-09 LAB — TSH: TSH: 0.93 u[IU]/mL (ref 0.35–5.50)

## 2023-07-09 LAB — VITAMIN B12: Vitamin B-12: 391 pg/mL (ref 211–911)

## 2023-07-09 MED ORDER — VERAPAMIL HCL ER 120 MG PO TBCR: 120.0000 mg | EXTENDED_RELEASE_TABLET | Freq: Every day | ORAL | 11 refills | Status: DC

## 2023-07-09 NOTE — Assessment & Plan Note (Signed)
 Typically associated with exercise Will refer to pulmonary to r/o possible exercise induced Consider spirometry, pfts

## 2023-07-09 NOTE — Progress Notes (Signed)
 [facility]  PATIENT: Elizabeth Williamson DOB: 1990/09/04  REFERRING DOCTOR OR PCP:  Ronna Polio, MD SOURCE: Patient, notes from 08/07/2021 emergency room visit, imaging and lab reports, MRI images personally reviewed.  _________________________________   HISTORICAL  CHIEF COMPLAINT:  Chief Complaint  Patient presents with   Follow-up    Pt in room 10. 2 children in room. Here for migraine follow up. Went to ER yesterday due to left sided numbness, SOB. Pt reports she has not had migraines in months.    HISTORY OF PRESENT ILLNESS:  Elizabeth Williamson is a 33 y.o. woman with paresthesias and abnormal brain MRI.  Update 07/09/2023: She went to the ED yesterday  She worked out and felt some SOB afterwards.   She then noted left sided numbness.   A little later, she developed a headache.  Bernita Raisin had not helped.    She was given Toradol and checked ANA, TSH and B12.   TSH and B12 were normal. ANA is pending.   We had rechecked MRI brain 11/27/2022 and it was unchanged compared to 2023 showing small round T2/FLAIR hyperintense foci predominantly in the subcortical and deep white matter of the cerebral hemispheres. This is a nonspecific pattern and most likely reflects chronic microvascular ischemic changes or the sequela of migraine headache. The pattern would not be typical for demyelination.  Due to multiple small round subcortical foci in the head a TEE with bubble contrast was performed.   She was found to have passage of some bubbles 6 beats after injection (most c/w intrapulmonary shunting rather than ASD/PFO)   CT angiogram of the chest showed no evidence of an AVM.     She has no h/o DVT, PE.     She was breech delivery twin.  She does not know her Apgar's..  She left the hospital in just a few days.  She reached her developmental milestones appropriately She has had 5 complicated migraine ith left sided numbness, te first one being the most severe.     Even when no  HA, she continues to have some left sided tingling and some headaches but no new symptoms    The first and last episodes were a little painful.     Migraine frequency is much better, now occurring < 1 a month (was 10-15/month).      Gait, strength and vision are fine.  Some days, she feels more foggy headed, when a HA occurs.       Vascular risks:   No cardiac issues except some palpitations,   She does not smoke now but smoked 1/2 - 1 ppd x 5 years.  No DM or HTN.   History of Numbness and studies: She has experienced numbness nad tingling in te left side of her body since 2022.  In the more distant past,  she would have episodes of numbness and pian in the left arm but not leg like more recently.     Last week, she had more increased pain in her left side and back and difficulty with vision.   She noted blurriness out of her left eye.   She feels these symptoms are the same today as a few days ago when she went to the ED 08/07/2021.    She last saw ophthalmology Decembr 2022.  At that time the left eye was mildly worse than her right eye (we don;t have records).     She went to the ED October 2022 due to LBP  and numbness in the left leg.   Imaging was not performed.   She had a prednisone pack and symptoms improved.      She had migraines as a teenager but no typical migraines x many years.   She reports a chemical exposure last year and had headache  She was a full term twin and was breech and weighted 5  pounds 11 oz   No difficulty with pregnancy or delivery.  She reached all milestones.      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      In 2023,  MRI of the brain had shown multiple T2/FLAIR hyperintense foci predominantly in the subcortical white matter.  She underwent transthoracic and transesophageal echocardiogram with bubble contrast.  Some bubbles was seen crossing after 6 beats (potentially worrisome for pulmonary shunting).  CTA of the chest did not show any AVMs  or other shunt    .  Repeat MRI of the brain in 2024 showed no changes compared to 2023.   Imaging: MRI of the brain 08/07/2021 shows numerous T2/FLAIR hyperintense foci in the subcortical and deep white matter.  A couple foci are juxtacortical.  There are only a couple very small periventricular foci and no infratentorial foci.  None of the foci appear to be acute on diffusion-weighted imaging there was no enhancement.  Fourth ventricle larger than typical but adjacent brain is normal and this is unlikely to be significant.  MRI of the cervical spine 08/07/2021 was normal  Transthoracic echocardiogram 08/18/2021 with bubble contrast showed bubbles passing.  They were late (greater than 6 cardiac cycles) more suggestive of intrapulmonary shunt than PFO.   Transesophageal echo 09/26/2021 showed no evidence of intracardiac shunt or PFO.   CT angiogram of the chest 11/28/2021 showed no evidence of AVM.  Laboratory: CBC and BMP 08/07/2021 were normal 07/11/2020: TB negative, hep B surface antibody positive, varicella IgG positive   REVIEW OF SYSTEMS: Constitutional: No fevers, chills, sweats, or change in appetite Eyes: No visual changes, double vision, eye pain Ear, nose and throat: No hearing loss, ear pain, nasal congestion, sore throat Cardiovascular: No chest pain, palpitations Respiratory:  No shortness of breath at rest or with exertion.   No wheezes GastrointestinaI: No nausea, vomiting, diarrhea, abdominal pain, fecal incontinence Genitourinary:  No dysuria, urinary retention or frequency.  No nocturia. Musculoskeletal:  No neck pain, back pain Integumentary: No rash, pruritus, skin lesions Neurological: as above Psychiatric: No depression at this time.  No anxiety Endocrine: No palpitations, diaphoresis, change in appetite, change in weigh or increased thirst Hematologic/Lymphatic:  No anemia, purpura, petechiae. Allergic/Immunologic: No itchy/runny eyes, nasal congestion, recent allergic  reactions, rashes  ALLERGIES: Allergies  Allergen Reactions   Iron Other (See Comments)    constipation    HOME MEDICATIONS:  Current Outpatient Medications:    albuterol (VENTOLIN HFA) 108 (90 Base) MCG/ACT inhaler, Inhale 2 puffs into the lungs every 6 (six) hours as needed for wheezing or shortness of breath., Disp: 8 g, Rfl: 1   citalopram (CELEXA) 20 MG tablet, TAKE 1 TABLET BY MOUTH EVERY DAY, Disp: 90 tablet, Rfl: 0   levonorgestrel (MIRENA) 20 MCG/DAY IUD, 1 each by Intrauterine route once., Disp: , Rfl:    Multiple Vitamin (MULTIVITAMIN WITH MINERALS) TABS tablet, Take 2 tablets by mouth daily., Disp: , Rfl:    Plecanatide (TRULANCE) 3 MG TABS, Take 1 tablet (3 mg total) by mouth daily., Disp: 90 tablet, Rfl: 0   Ubrogepant (UBRELVY) 100 MG  TABS, One po every day prn headache, Disp: 10 tablet, Rfl: 5   verapamil (CALAN-SR) 120 MG CR tablet, Take 1 tablet (120 mg total) by mouth at bedtime., Disp: 30 tablet, Rfl: 11  PAST MEDICAL HISTORY: Past Medical History:  Diagnosis Date   Alopecia    got injections through dermatologist   Migraine    rare    PAST SURGICAL HISTORY: Past Surgical History:  Procedure Laterality Date   BUBBLE STUDY  09/26/2021   Procedure: BUBBLE STUDY;  Surgeon: Meriam Sprague, MD;  Location[facility] ENDOSCOPY;  Service: Cardiovascular;;   diastasis recti repair     HERNIA REPAIR  2021   umbilical   TEE WITHOUT CARDIOVERSION N/A 09/26/2021   Procedure: TRANSESOPHAGEAL ECHOCARDIOGRAM (TEE);  Surgeon: Meriam Sprague, MD;  Location: [facility] ENDOSCOPY;  Service: Cardiovascular;  Laterality: N/A;    FAMILY HISTORY: Family History  Problem Relation Age of Onset   Healthy Mother    Healthy Father    Heart disease Brother        cardiomegaly   Diabetes Maternal Grandmother    Colon cancer Maternal Grandfather        5s   Hypertension Paternal Grandmother    Lung cancer Paternal Uncle        not a smoker    SOCIAL HISTORY:  Social  History   Socioeconomic History   Marital status: Married    Spouse name: Not on file   Number of children: 0   Years of education: Not on file   Highest education level: Not on file  Occupational History   Occupation: registered Academic librarian: [employer]  Tobacco Use   Smoking status: Former    Current packs/day: 0.00    Average packs/day: 0.5 packs/day for 3.0 years (1.5 ttl pk-yrs)    Types: Cigarettes    Start date: 08/15/2010    Quit date: 08/14/2013    Years since quitting: 9.9   Smokeless tobacco: Never   Tobacco comments:    increased to 3 pack/week  Vaping Use   Vaping status: Never Used  Substance and Sexual Activity   Alcohol use: Yes    Comment: Occasional social drinker, every other weekend   Drug use: No   Sexual activity: Yes    Partners: Male    Birth control/protection: Implant  Other Topics Concern   Not on file  Social History Narrative   One boy one girl 6 and 5      Right handed   Coffee- 12 oz per day   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 No pets. 1 son (Dexton) born 10/12/15.  No passive tobacco exposure.    Social Drivers of Corporate investment banker Strain: Low Risk  (09/07/2020)   Received from [facility], [facility]   Overall Financial Resource Strain (CARDIA)    Difficulty of Paying Living Expenses: Not hard at all  Food Insecurity: Low Risk  (11/27/2022)   Received from [facility]   Hunger Vital Sign    Worried About Running Out of Food in the Last Year: Never true    Ran Out of Food in the Last Year: Never true  Transportation Needs: Not on file (11/27/2022)  Physical Activity: Insufficiently Active (09/07/2020)   Received from [facility], [facility]   Exercise Vital Sign    Days of Exercise per Week: 4 days    Minutes of Exercise per Session: 30 min  Stress: No Stress Concern Present (09/07/2020)   Received from [facility], [facility]   [facility] -  Occupational Stress Questionnaire    Feeling of Stress : Not at all  Social Connections: Socially Integrated (09/07/2020)   Received from [facility], [facility]   Social Connection and Isolation Panel [NHANES]    Frequency of Communication with Friends and Family: More than three times a week    Frequency of Social Gatherings with Friends and Family: Once a week    Attends Religious Services: More than 4 times per year    Active Member of [employer] or Organizations: Yes    Attends Engineer, structural: More than 4 times per year    Marital Status: Married  Catering manager Violence: Not At Risk (09/07/2020)   Received from [facility], [facility]   Humiliation, Afraid, Rape, and Kick questionnaire    Fear of Current or Ex-Partner: No    Emotionally Abused: No    Physically Abused: No    Sexually Abused: No     PHYSICAL EXAM  Vitals:   07/09/23 1540  BP: 115/71  Pulse: 76  Weight: 182 lb 8 oz (82.8 kg)  Height: 5\' 7"  (1.702 m)    Body mass index is 28.58 kg/m.   General: The patient is well-developed and well-nourished and in no acute distress.  Neck is nontender with good ROM  HEENT:  Head is [place]/AT.  Sclera are anicteric.  Funduscopic exam is normal  Skin: Extremities are without rash or  edema.  Musculoskeletal:  Back is nontender  Neurologic Exam  Mental status: The patient is alert and oriented x 3 at the time of the examination. The patient has apparent normal recent and remote memory, with an apparently normal attention span and concentration ability.   Speech is normal.  Cranial nerves: Extraocular movements are full.  Facial stregnth and snsation were fine.   No obvious hearing deficits are noted.  Motor:  Muscle bulk is normal.   Tone is normal. Strength is  5 / 5 in all 4 extremities.   Sensory: Sensory testing today was symmetric to temp and vib  Coordination: Cerebellar testing reveals good finger-nose-finger and heel-to-shin  bilaterally.  Gait and station: Station is normal.   Gait is normal. Tandem gait is normal. Romberg is negative.   Reflexes: Deep tendon reflexes are symmetric and normal bilaterally.       DIAGNOSTIC DATA (LABS, IMAGING, TESTING) - I reviewed patient records, labs, notes, testing and imaging myself where available.  Lab Results  Component Value Date   WBC 4.6 11/01/2022   HGB 12.0 11/01/2022   HCT 38.1 11/01/2022   MCV 88.9 11/01/2022   PLT 198.0 11/01/2022      Component Value Date/Time   NA 136 11/01/2022 1359   NA 142 12/31/2018 1632   K 4.0 11/01/2022 1359   CL 101 11/01/2022 1359   CO2 29 11/01/2022 1359   GLUCOSE 137 (H) 11/01/2022 1359   BUN 13 11/01/2022 1359   BUN 10 12/31/2018 1632   CREATININE 0.73 11/01/2022 1359   CREATININE 0.58 07/19/2014 0001   CALCIUM 9.8 11/01/2022 1359   PROT 7.4 11/01/2022 1359   PROT 6.8 09/27/2017 0916   ALBUMIN 4.7 11/01/2022 1359   ALBUMIN 4.4 09/27/2017 0916   AST 19 11/01/2022 1359   ALT 17 11/01/2022 1359   ALKPHOS 54 11/01/2022 1359   BILITOT 0.3 11/01/2022 1359  BILITOT 0.2 09/27/2017 0916   GFRNONAA >60 09/26/2021 0855   GFRAA >60 08/21/2019 1611   Lab Results  Component Value Date   CHOL 177 11/15/2021   HDL 90.60 11/15/2021   LDLCALC 82 11/15/2021   TRIG 25.0 11/15/2021   CHOLHDL 2 11/15/2021   Lab Results  Component Value Date   HGBA1C 5.6 11/15/2021   Lab Results  Component Value Date   VITAMINB12 391 07/09/2023   Lab Results  Component Value Date   TSH 0.93 07/09/2023       ASSESSMENT AND PLAN  Complicated migraine  White matter abnormality on MRI of brain  Numbness   She has another event that appears to be a complicated migraine.  There has been 5 the last 2 years.  CR 180 mg as a prophylactic agent.  He will continue to take occurs. The MRI of the brain shows multiple T2/FLAIR hyperintense foci much more than typical for age and more than expected for a patient with migraines, though  that remains the most likely explanation.  We also discussed that these could have been present since birth.  Repeat MRI showed stability but if she has additional hemisensory episodes I would consider a repeat study.   Return in 6 months or sooner if there are new or worsening neurologic symptoms or based on results of further studies.    Jerald Villalona A. Epimenio Foot, MD, [facility] 07/09/2023, 7:44 PM Certified in Neurology, Clinical Neurophysiology, Sleep Medicine and Neuroimaging  [facility] [address], [address] [place], [place] [address] [phone]

## 2023-07-09 NOTE — Patient Instructions (Signed)
  A referral was placed today for pulmonary  Please let us know if you have not heard back within 2 weeks about the referral.

## 2023-07-09 NOTE — Telephone Encounter (Signed)
 Called pt. Scheduled work in for 4pm with Dr. Epimenio Foot today. Asked she check in by 3:30pm/3:45p and bring updated insurance cards for this yr. She verbalized understanding.

## 2023-07-09 NOTE — Assessment & Plan Note (Deleted)
 Typically associated with exercise Will refer to pulmonary to r/o possible exercise induced Consider spirometry, pfts

## 2023-07-09 NOTE — Telephone Encounter (Signed)
Pt rescheduled appointment.

## 2023-07-09 NOTE — Progress Notes (Signed)
 Established Patient Office Visit  Subjective:   Patient ID: Elizabeth Williamson, female    DOB: Nov 19, 1990  Age: 33 y.o. MRN: [id number]  CC:  Chief Complaint  Patient presents with   Medical Management of Chronic Issues    HPI: Elizabeth Williamson is a 33 y.o. female presenting on 07/09/2023 for Medical Management of Chronic Issues  She has been noticing some increasing shortness of breath and feeling 'odd' while working out,happened right at the end of her workout when about to cool down and stretch. While exercising yesterday she started to experience left sided numbness from head to toe.   Went to ER yesterday CT brain, acute findings  CXR no acute findings  EKG NSR stable Cbc with low neutrophils high lymphocytes (slight) Cmp unremarkable Tropononin x 1 neg   She was discharged and a referral was placed for neurology   Today she is still experiencing the left sided numbness on left side of face. She states the sensation has changed from numbness to a prickly sensation.   As far as history 2022 returned from a trip to [place] and that night she felt the entire left side feeling numb with sob and tingling/lightheadedness. She states she had went to ER and MRI was completed showing lesion, was referred to a neurology and was advised she had 'unspecified lesions' on his brain. He referred her then to cardiology.   Last visit cardiology 2023, EKG sinus rhythm. They considered possible undiagnosed patent foramen ovale, despite normal heart structures on echo. Did have a positive bubble study with suggestion of intrapulmonary shunting. With workup complete they suspected possible atypical migraine, and referred back to neurology to also discuss possible non-emergent LP. Transesophageal echo was ordered to r/o intracardiac shunt, which was negative and intrapulmonary shunting was unlikely.   She does follow with Dr. Epimenio Foot, last seen 09/07/2022, they suspected episodic migraine, more chronic.  Added on keppra but couldn't tolerate so on ubrelvy. Suspected T2 hyperintense foci on brain MRI migraine headache. Per note will consider repeat MRI if HA not better. Last MRI 11/27/22 T2 hyperintense foci, no new lesions from MRI 08/22/21   She can exercise at times without sob and then at other times will have the accompanying sob. Doesn't seem to correlate with intensity of workout. Sometimes will have sob even with a light 20 min workout. Doesn't notice any change in cold or hot weather in regards to breathing. She did have sob the other day and decided to see if there would be any improvement with an inhaler of which there was not improvement. 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   She states prior to yesterdays episode, she has not felt the entire experience of numbness, head 'feeling weird' and sob.          ROS: Negative unless specifically indicated above in HPI.   Relevant past medical history reviewed and updated as indicated.   Allergies and medications reviewed and updated.   Current Outpatient Medications:    albuterol (VENTOLIN HFA) 108 (90 Base) MCG/ACT inhaler, Inhale 2 puffs into the lungs every 6 (six) hours as needed for wheezing or shortness of breath., Disp: 8 g, Rfl: 1   citalopram (CELEXA) 20 MG tablet, TAKE 1 TABLET BY MOUTH EVERY DAY, Disp: 90 tablet, Rfl: 0   levonorgestrel (MIRENA) 20 MCG/DAY IUD, 1 each by Intrauterine route once., Disp: , Rfl:    Multiple Vitamin (MULTIVITAMIN WITH MINERALS) TABS tablet, Take 2 tablets by mouth daily.,  Disp: , Rfl:    Plecanatide (TRULANCE) 3 MG TABS, Take 1 tablet (3 mg total) by mouth daily., Disp: 90 tablet, Rfl: 0   Ubrogepant (UBRELVY) 100 MG TABS, One po every day prn headache, Disp: 10 tablet, Rfl: 5  Allergies  Allergen Reactions   Iron Other (See Comments)    constipation    Objective:   BP 110/72 (BP Location: Left Arm, Patient Position: Sitting, Cuff Size: Normal)   Pulse 75   Temp 98 F (36.7  C) (Temporal)   Ht 5\' 7"  (1.702 m)   Wt 183 lb 9.6 oz (83.3 kg)   SpO2 98%   BMI 28.76 kg/m    Physical Exam Vitals reviewed.  Constitutional:      General: She is not in acute distress.    Appearance: Normal appearance. She is normal weight. She is not ill-appearing, toxic-appearing or diaphoretic.  HENT:     Head: Normocephalic.  Cardiovascular:     Rate and Rhythm: Normal rate and regular rhythm.  Pulmonary:     Effort: Pulmonary effort is normal.     Breath sounds: Normal breath sounds.  Musculoskeletal:        General: Normal range of motion.  Neurological:     General: No focal deficit present.     Mental Status: She is alert and oriented to person, place, and time. Mental status is at baseline.  Psychiatric:        Mood and Affect: Mood normal.        Behavior: Behavior normal.        Thought Content: Thought content normal.        Judgment: Judgment normal.     Assessment & Plan:  SOB (shortness of breath) on exertion Assessment & Plan: Typically associated with exercise Will refer to pulmonary to r/o possible exercise induced Consider spirometry, pfts  Orders: -     Ambulatory referral to Pulmonology -     ANA Screen,IFA,Reflex Titer/Pattern,Reflex Mplx 11 Ab Cascade with IdentRA  Dyspnea on exertion -     ANA Screen,IFA,Reflex Titer/Pattern,Reflex Mplx 11 Ab Cascade with IdentRA  Nonintractable headache, unspecified chronicity pattern, unspecified headache type -     ANA Screen,IFA,Reflex Titer/Pattern,Reflex Mplx 11 Ab Cascade with IdentRA  Dysesthesia Assessment & Plan: Ongoing  Followed by neurology  Has seen cardiology with negative workup  With combo of dysesthesia, headache, sob will work up for lupus, sarcoidosis Ordering ana panel pending results    Orders: -     ANA Screen,IFA,Reflex Titer/Pattern,Reflex Mplx 11 Ab Cascade with IdentRA -     TSH -     Vitamin B12     Follow up plan: Return if symptoms worsen or fail to  improve.  Mort Sawyers, FNP

## 2023-07-09 NOTE — Assessment & Plan Note (Signed)
 Ongoing  Followed by neurology  Has seen cardiology with negative workup  With combo of dysesthesia, headache, sob will work up for lupus, sarcoidosis Ordering ana panel pending results

## 2023-07-14 LAB — ANA SCREEN,IFA,REFLEX TITER/PATTERN,REFLEX MPLX 11 AB CASCADE
Anti Nuclear Antibody (ANA): NEGATIVE
Cyclic Citrullin Peptide Ab: <16 U
MUTATED CITRULLINATED VIMENTIN (MCV) AB: <20 U/mL (ref <20)
Rheumatoid fact SerPl-aCnc: <10 [IU]/mL (ref <14)

## 2023-07-15 ENCOUNTER — Encounter: Payer: Self-pay | Admitting: Family

## 2023-07-27 ENCOUNTER — Other Ambulatory Visit: Payer: Self-pay | Admitting: Family

## 2023-07-27 DIAGNOSIS — F411 Generalized anxiety disorder: Secondary | ICD-10-CM

## 2023-08-07 ENCOUNTER — Ambulatory Visit
Admission: RE | Admit: 2023-08-07 | Discharge: 2023-08-07 | Disposition: A | Discharge status: Home / Self Care | Source: Ambulatory Visit | Attending: Internal Medicine | Admitting: Internal Medicine

## 2023-08-07 ENCOUNTER — Encounter: Payer: Self-pay | Admitting: Internal Medicine

## 2023-08-07 ENCOUNTER — Ambulatory Visit: Admitting: Internal Medicine

## 2023-08-07 VITALS — BP 118/72 | HR 95 | Temp 97.1°F | Ht 67.0 in | Wt 176.2 lb

## 2023-08-07 DIAGNOSIS — R0602 Shortness of breath: Secondary | ICD-10-CM

## 2023-08-07 LAB — NITRIC OXIDE: Nitric Oxide: 19

## 2023-08-07 NOTE — Patient Instructions (Signed)
 Obtain 2 view chest x-ray Obtain pulmonary function testing to assess lung function Obtain overnight pulse oximetry to assess for nocturnal hypoxia(oxygen  levels at night) Consider second opinion with [facility] neurology  Avoid Allergens and Irritants Avoid secondhand smoke Avoid SICK contacts Recommend  Masking  when appropriate Recommend Keep up-to-date with vaccinations

## 2023-08-07 NOTE — Progress Notes (Signed)
 [facility] [place] Pulmonary Medicine Consultation      Date: 08/07/2023,   MRN# [id number] Elizabeth Williamson 08-28-1990     CHIEF COMPLAINT:   Assessment for asthma   HISTORY OF PRESENT ILLNESS   33 year old pleasant African-American female seen today for shortness of breath Her shortness of breath is intermittent No evidence of wheezing coughing productive cough no fevers  No evidence of heart failure at this time No evidence or signs of infection at this time No respiratory distress No fevers, chills, nausea, vomiting, diarrhea No evidence of lower extremity edema No evidence hemoptysis  2 years ago patient had acute left-sided weakness left-sided numbness and tingling with some chest pain Patient was seen by neurologist at [facility] there is a questionable diagnosis of multiple sclerosis However patient is not on active treatment Patient also was referred to cardiologist patient received echo and TEE which did not show any significant abnormalities Patient was then placed on aspirin therapy for 6 months  Every now and then she has flares of numbness and tingling over the left side She also has associated shortness of breath Several chest x-rays and CT scans of her chest did not show any significant abnormalities Patient was given albuterol  inhaler last year which does not help Patient is active and works out  She works as a Engineer, 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[employer] Occasional alcohol use Former smoker No significant illicit drug use noted  Assessment of ASTHMA FeNO 19    ppb-Elevated exhaled Nitric oxide  testing NOT consistent with type II inflammation   Elevated pulse oximetry in office did not show significant hypoxia   PAST MEDICAL HISTORY   Past Medical History:  Diagnosis Date   Alopecia    got injections through dermatologist   Migraine    rare     SURGICAL HISTORY   Past Surgical History:  Procedure Laterality Date   BUBBLE STUDY  09/26/2021    Procedure: BUBBLE STUDY;  Surgeon: Sonny Dust, MD;  Location: [facility] ENDOSCOPY;  Service: Cardiovascular;;   diastasis recti repair     HERNIA REPAIR  2021   umbilical   TEE WITHOUT CARDIOVERSION N/A 09/26/2021   Procedure: TRANSESOPHAGEAL ECHOCARDIOGRAM (TEE);  Surgeon: Sonny Dust, MD;  Location: [facility] ENDOSCOPY;  Service: Cardiovascular;  Laterality: N/A;     FAMILY HISTORY   Family History  Problem Relation Age of Onset   Healthy Mother    Healthy Father    Heart disease Brother        cardiomegaly   Diabetes Maternal Grandmother    Colon cancer Maternal Grandfather        63s   Hypertension Paternal Grandmother    Lung cancer Paternal Uncle        not a smoker     SOCIAL HISTORY   Social History   Tobacco Use   Smoking status: Former    Current packs/day: 0.00    Average packs/day: 0.5 packs/day for 3.0 years (1.5 ttl pk-yrs)    Types: Cigarettes    Start date: 08/15/2010    Quit date: 08/14/2013    Years since quitting: 9.9   Smokeless tobacco: Never   Tobacco comments:    increased to 3 pack/week  Vaping Use   Vaping status: Never Used  Substance Use Topics   Alcohol use: Yes    Comment: Occasional social drinker, every other weekend   Drug use: No     MEDICATIONS    Home Medication:  Current Outpatient Rx  Order #: [id number] Class: Normal   Order #: [id number] Class: Normal   Order #: [id number] Class: Historical Med   Order #: [id number] Class: Historical Med   Order #: [id number] Class: Normal   Order #: [id number] Class: Normal   Order #: [id number] Class: Normal    Current Medication:  Current Outpatient Medications:    albuterol  (VENTOLIN  HFA) 108 (90 Base) MCG/ACT inhaler, Inhale 2 puffs into the lungs every 6 (six) hours as needed for wheezing or shortness of breath., Disp: 8 g, Rfl: 1   citalopram  (CELEXA ) 20 MG tablet, TAKE 1 TABLET BY MOUTH EVERY DAY, Disp: 90 tablet, Rfl: 0   levonorgestrel (MIRENA) 20 MCG/DAY IUD, 1 each by  Intrauterine route once., Disp: , Rfl:    Multiple Vitamin (MULTIVITAMIN WITH MINERALS) TABS tablet, Take 2 tablets by mouth daily., Disp: , Rfl:    Plecanatide  (TRULANCE ) 3 MG TABS, Take 1 tablet (3 mg total) by mouth daily., Disp: 90 tablet, Rfl: 0   Ubrogepant  (UBRELVY ) 100 MG TABS, One po every day prn headache, Disp: 10 tablet, Rfl: 5   verapamil  (CALAN -SR) 120 MG CR tablet, Take 1 tablet (120 mg total) by mouth at bedtime., Disp: 30 tablet, Rfl: 11   BP 118/72 (BP Location: Right Arm, Cuff Size: Normal)   Pulse 95   Temp (!) 97.1 F (36.2 C)   Ht 5\' 7"  (1.702 m)   Wt 176 lb 3.2 oz (79.9 kg)   SpO2 99%   BMI 27.60 kg/m    Review of Systems: Gen:  Denies  fever, sweats, chills weight loss  HEENT: Denies blurred vision, double vision, ear pain, eye pain, hearing loss, nose bleeds, sore throat Cardiac:  No dizziness, chest pain or heaviness, chest tightness,edema, No JVD Resp:   No cough, -sputum production, -shortness of breath,-wheezing, -hemoptysis,  Other:  All other systems negative   Physical Examination:   General Appearance: No distress  EYES PERRLA, EOM intact.   NECK Supple, No JVD Pulmonary: normal breath sounds, No wheezing.  CardiovascularNormal S1,S2.  No m/r/g.   Abdomen: Benign, Soft, non-tender. Neurology UE/LE 5/5 strength, no focal deficits Ext pulses intact, cap refill intact ALL OTHER ROS ARE NEGATIVE  CBC    Component Value Date/Time   WBC 4.6 11/01/2022 1359   RBC 4.29 11/01/2022 1359   HGB 12.0 11/01/2022 1359   HGB 11.6 12/31/2018 1632   HGB 12.0 02/23/2015 0000   HCT 38.1 11/01/2022 1359   HCT 36.8 12/31/2018 1632   PLT 198.0 11/01/2022 1359   PLT 214 12/31/2018 1632   PLT 231 02/23/2015 0000   MCV 88.9 11/01/2022 1359   MCV 90 12/31/2018 1632   MCH 28.2 09/26/2021 0855   MCHC 31.6 11/01/2022 1359   RDW 13.1 11/01/2022 1359   RDW 12.1 12/31/2018 1632   LYMPHSABS 2.4 11/01/2022 1359   LYMPHSABS 1.7 09/27/2017 0916   MONOABS 0.3  11/01/2022 1359   EOSABS 0.2 11/01/2022 1359   EOSABS 0.2 09/27/2017 0916   BASOSABS 0.0 11/01/2022 1359   BASOSABS 0.0 09/27/2017 0916      Latest Ref Rng & Units 11/01/2022    1:59 PM 09/26/2021    8:55 AM 08/21/2021    6:37 PM  BMP  Glucose 70 - 99 mg/dL 756  97  433   BUN 6 - 23 mg/dL 13  10  10    Creatinine 0.40 - 1.20 mg/dL 2.95  1.88  4.16   Sodium 135 - 145 mEq/L 136  137  137   Potassium 3.5 - 5.1  mEq/L 4.0  4.5  3.5   Chloride 96 - 112 mEq/L 101  108  106   CO2 19 - 32 mEq/L 29  24  24    Calcium 8.4 - 10.5 mg/dL 9.8  9.2  9.6       ASSESSMENT/PLAN   33 year old pleasant African-American female presents today with ongoing shortness of breath of the last several years intermittent without any evidence of wheezing coughing nonproductive cough and no active signs of infection.  There has been some type of neurological event that occurred 2 years ago which has been undiagnosed At this time his shortness of breath is going down however I will obtain a chest x-ray pulmonary function testing with maximal inspiratory and expiratory force.  Will also obtain overnight pulse oximetry to assess for nocturnal hypoxia  Do recommend possible second opinion with [facility] neurology to assess for intermittent left-sided numbness and tingling. Some neurological processes can cause shortness of breath and muscle weakness  Patient does not have significant evidence of asthma or COPD Therefore inhaler therapy would not be initiated at this time  MEDICATION ADJUSTMENTS/LABS AND TESTS ORDERED: No inhalers at this time PFTs with MIP and MEP Chest x-ray Follow-up with neurology   CURRENT MEDICATIONS REVIEWED AT LENGTH WITH PATIENT TODAY   Patient  satisfied with Plan of action and management. All questions answered  Follow up 6 weeks  I spent a total of 65 minutes reviewing chart data, face-to-face evaluation with the patient, counseling and coordination of care as detailed above.      Lady Pier, M.D.  [facility] Pulmonary & Critical Care Medicine  Medical Director [facility] Medical Director [facility] Cardio-Pulmonary Department

## 2023-08-19 DIAGNOSIS — G450 Vertebro-basilar artery syndrome: Secondary | ICD-10-CM | POA: Diagnosis not present

## 2023-08-19 DIAGNOSIS — G459 Transient cerebral ischemic attack, unspecified: Secondary | ICD-10-CM | POA: Diagnosis not present

## 2023-08-19 DIAGNOSIS — R42 Dizziness and giddiness: Secondary | ICD-10-CM | POA: Diagnosis not present

## 2023-08-19 DIAGNOSIS — Z79899 Other long term (current) drug therapy: Secondary | ICD-10-CM | POA: Diagnosis not present

## 2023-08-19 DIAGNOSIS — R414 Neurologic neglect syndrome: Secondary | ICD-10-CM | POA: Diagnosis not present

## 2023-08-19 DIAGNOSIS — R202 Paresthesia of skin: Secondary | ICD-10-CM | POA: Diagnosis not present

## 2023-08-19 DIAGNOSIS — R0602 Shortness of breath: Secondary | ICD-10-CM | POA: Diagnosis not present

## 2023-08-19 DIAGNOSIS — R0609 Other forms of dyspnea: Secondary | ICD-10-CM | POA: Diagnosis not present

## 2023-08-19 DIAGNOSIS — R200 Anesthesia of skin: Secondary | ICD-10-CM | POA: Diagnosis not present

## 2023-08-26 DIAGNOSIS — G450 Vertebro-basilar artery syndrome: Secondary | ICD-10-CM | POA: Diagnosis not present

## 2023-08-26 DIAGNOSIS — R200 Anesthesia of skin: Secondary | ICD-10-CM | POA: Diagnosis not present

## 2023-08-26 DIAGNOSIS — G459 Transient cerebral ischemic attack, unspecified: Secondary | ICD-10-CM | POA: Diagnosis not present

## 2023-09-03 DIAGNOSIS — E041 Nontoxic single thyroid nodule: Secondary | ICD-10-CM | POA: Diagnosis not present

## 2023-09-06 ENCOUNTER — Encounter: Payer: Self-pay | Admitting: Internal Medicine

## 2023-10-03 ENCOUNTER — Ambulatory Visit: Admitting: Internal Medicine

## 2023-10-03 ENCOUNTER — Encounter

## 2023-10-03 NOTE — Progress Notes (Deleted)
 [facility] [place] Pulmonary Medicine Consultation      Date: 10/03/2023,   MRN# [id number] Elizabeth Williamson [birth year]     CHIEF COMPLAINT:   Assessment for asthma   HISTORY OF PRESENT ILLNESS   33 year old pleasant African-American female seen today for shortness of breath Her shortness of breath is intermittent No evidence of wheezing coughing productive cough no fevers  No evidence of heart failure at this time No evidence or signs of infection at this time No respiratory distress No fevers, chills, nausea, vomiting, diarrhea No evidence of lower extremity edema No evidence hemoptysis  2 years ago patient had acute left-sided weakness left-sided numbness and tingling with some chest pain Patient was seen by neurologist at [facility] neurology there is a questionable diagnosis of multiple sclerosis However patient is not on active treatment Patient also was referred to cardiologist patient received echo and TEE which did not show any significant abnormalities Patient was then placed on aspirin therapy for 6 months  Every now and then she has flares of numbness and tingling over the left side She also has associated shortness of breath Several chest x-rays and CT scans of her chest did not show any significant abnormalities Patient was given albuterol  inhaler last year which does not help Patient is active and works out  She works as a Engineer, 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[employer] Occasional alcohol use Former smoker No significant illicit drug use noted  Assessment of ASTHMA FeNO 19    ppb-Elevated exhaled Nitric oxide  testing NOT consistent with type II inflammation   Elevated pulse oximetry in office did not show significant hypoxia   PAST MEDICAL HISTORY   Past Medical History:  Diagnosis Date   Alopecia    got injections through dermatologist   Migraine    rare     SURGICAL HISTORY   Past Surgical History:  Procedure Laterality Date   BUBBLE STUDY  09/26/2021    Procedure: BUBBLE STUDY;  Surgeon: Sonny Dust, MD;  Location: [facility] ENDOSCOPY;  Service: Cardiovascular;;   diastasis recti repair     HERNIA REPAIR  2021   umbilical   TEE WITHOUT CARDIOVERSION N/A 09/26/2021   Procedure: TRANSESOPHAGEAL ECHOCARDIOGRAM (TEE);  Surgeon: Sonny Dust, MD;  Location: [facility] ENDOSCOPY;  Service: Cardiovascular;  Laterality: N/A;     FAMILY HISTORY   Family History  Problem Relation Age of Onset   Healthy Mother    Healthy Father    Heart disease Brother        cardiomegaly   Diabetes Maternal Grandmother    Colon cancer Maternal Grandfather        60s   Hypertension Paternal Grandmother    Lung cancer Paternal Uncle        not a smoker     SOCIAL HISTORY   Social History   Tobacco Use   Smoking status: Former    Current packs/day: 0.00    Average packs/day: 0.5 packs/day for 3.0 years (1.5 ttl pk-yrs)    Types: Cigarettes    Start date: 08/15/2010    Quit date: 08/14/2013    Years since quitting: 10.1   Smokeless tobacco: Never   Tobacco comments:    increased to 3 pack/week  Vaping Use   Vaping status: Never Used  Substance Use Topics   Alcohol use: Yes    Comment: Occasional social drinker, every other weekend   Drug use: No     MEDICATIONS    Home Medication:  Current Outpatient Rx  Order #: [id number] Class: Normal   Order #: [id number] Class: Normal   Order #: [id number] Class: Historical Med   Order #: [id number] Class: Historical Med   Order #: [id number] Class: Normal   Order #: [id number] Class: Normal   Order #: [id number] Class: Normal    Current Medication:  Current Outpatient Medications:    albuterol  (VENTOLIN  HFA) 108 (90 Base) MCG/ACT inhaler, Inhale 2 puffs into the lungs every 6 (six) hours as needed for wheezing or shortness of breath., Disp: 8 g, Rfl: 1   citalopram  (CELEXA ) 20 MG tablet, TAKE 1 TABLET BY MOUTH EVERY DAY, Disp: 90 tablet, Rfl: 0   levonorgestrel (MIRENA) 20 MCG/DAY IUD, 1 each by  Intrauterine route once., Disp: , Rfl:    Multiple Vitamin (MULTIVITAMIN WITH MINERALS) TABS tablet, Take 2 tablets by mouth daily., Disp: , Rfl:    Plecanatide  (TRULANCE ) 3 MG TABS, Take 1 tablet (3 mg total) by mouth daily., Disp: 90 tablet, Rfl: 0   Ubrogepant  (UBRELVY ) 100 MG TABS, One po every day prn headache, Disp: 10 tablet, Rfl: 5   verapamil  (CALAN -SR) 120 MG CR tablet, Take 1 tablet (120 mg total) by mouth at bedtime., Disp: 30 tablet, Rfl: 11   There were no vitals taken for this visit.   Review of Systems: Gen:  Denies  fever, sweats, chills weight loss  HEENT: Denies blurred vision, double vision, ear pain, eye pain, hearing loss, nose bleeds, sore throat Cardiac:  No dizziness, chest pain or heaviness, chest tightness,edema, No JVD Resp:   No cough, -sputum production, -shortness of breath,-wheezing, -hemoptysis,  Other:  All other systems negative   Physical Examination:   General Appearance: No distress  EYES PERRLA, EOM intact.   NECK Supple, No JVD Pulmonary: normal breath sounds, No wheezing.  CardiovascularNormal S1,S2.  No m/r/g.   Abdomen: Benign, Soft, non-tender. Neurology UE/LE 5/5 strength, no focal deficits Ext pulses intact, cap refill intact ALL OTHER ROS ARE NEGATIVE  CBC    Component Value Date/Time   WBC 4.6 11/01/2022 1359   RBC 4.29 11/01/2022 1359   HGB 12.0 11/01/2022 1359   HGB 11.6 12/31/2018 1632   HGB 12.0 02/23/2015 0000   HCT 38.1 11/01/2022 1359   HCT 36.8 12/31/2018 1632   PLT 198.0 11/01/2022 1359   PLT 214 12/31/2018 1632   PLT 231 02/23/2015 0000   MCV 88.9 11/01/2022 1359   MCV 90 12/31/2018 1632   MCH 28.2 09/26/2021 0855   MCHC 31.6 11/01/2022 1359   RDW 13.1 11/01/2022 1359   RDW 12.1 12/31/2018 1632   LYMPHSABS 2.4 11/01/2022 1359   LYMPHSABS 1.7 09/27/2017 0916   MONOABS 0.3 11/01/2022 1359   EOSABS 0.2 11/01/2022 1359   EOSABS 0.2 09/27/2017 0916   BASOSABS 0.0 11/01/2022 1359   BASOSABS 0.0 09/27/2017 0916       Latest Ref Rng & Units 11/01/2022    1:59 PM 09/26/2021    8:55 AM 08/21/2021    6:37 PM  BMP  Glucose 70 - 99 mg/dL 638  97  756   BUN 6 - 23 mg/dL 13  10  10    Creatinine 0.40 - 1.20 mg/dL 4.33  2.95  1.88   Sodium 135 - 145 mEq/L 136  137  137   Potassium 3.5 - 5.1 mEq/L 4.0  4.5  3.5   Chloride 96 - 112 mEq/L 101  108  106   CO2 19 - 32 mEq/L 29  24  24    Calcium 8.4 - 10.5 mg/dL 9.8  9.2  9.6       ASSESSMENT/PLAN   33 year old pleasant African-American female presents today with ongoing shortness of breath of the last several years intermittent without any evidence of wheezing coughing nonproductive cough and no active signs of infection.  There has been some type of neurological event that occurred 2 years ago which has been undiagnosed At this time his shortness of breath is going down however I will obtain a chest x-ray pulmonary function testing with maximal inspiratory and expiratory force.  Will also obtain overnight pulse oximetry to assess for nocturnal hypoxia  Do recommend possible second opinion with [facility] neurology to assess for intermittent left-sided numbness and tingling. Some neurological processes can cause shortness of breath and muscle weakness  Patient does not have significant evidence of asthma or COPD Therefore inhaler therapy would not be initiated at this time  MEDICATION ADJUSTMENTS/LABS AND TESTS ORDERED: No inhalers at this time PFTs with MIP and MEP Chest x-ray Follow-up with neurology   CURRENT MEDICATIONS REVIEWED AT LENGTH WITH PATIENT TODAY   Patient  satisfied with Plan of action and management. All questions answered  Follow up 6 weeks  I spent a total of 65 minutes reviewing chart data, face-to-face evaluation with the patient, counseling and coordination of care as detailed above.     Lady Pier, M.D.  [facility] Pulmonary & Critical Care Medicine  Medical Director [facility] Medical Director [facility]  Cardio-Pulmonary Department

## 2023-11-20 ENCOUNTER — Telehealth: Payer: Self-pay | Admitting: Internal Medicine

## 2023-11-20 NOTE — Telephone Encounter (Signed)
 LVMTCB to schedule PFT and rov.

## 2024-02-05 ENCOUNTER — Ambulatory Visit: Admitting: Neurology

## 2024-02-11 ENCOUNTER — Ambulatory Visit: Admitting: Neurology

## 2024-05-01 ENCOUNTER — Ambulatory Visit: Admitting: Family

## 2024-05-05 ENCOUNTER — Ambulatory Visit: Admitting: Family

## 2024-05-19 ENCOUNTER — Ambulatory Visit: Payer: Self-pay

## 2024-05-19 NOTE — Telephone Encounter (Signed)
 FYI Only or Action Required?: FYI only for provider: appointment scheduled on 05/22/24.  Patient was last seen in primary care on 07/09/2023 by Corwin Antu, FNP.  Called Nurse Triage reporting Headache and Sinusitis.  Symptoms began several months ago.  Interventions attempted: OTC medications: sudafed.  Symptoms are: unchanged.  Triage Disposition: See PCP When Office is Open (Within 3 Days)  Patient/caregiver understands and will follow disposition?: Yes   Reason for Disposition  [1] Sinus congestion (pressure, fullness) AND [2] present > 10 days  Answer Assessment - Initial Assessment Questions Pt reports chronic sinusitis and headaches (currently 6/10) since December. Is taking sudafed. Mid-December was tx with amoxicillin  by an outside provider with no improvement. Denies current fever.  1. LOCATION: Where does it hurt?      Below eyes and headache 2. ONSET: When did the sinus pain start?  (e.g., hours, days)      Early December 3. SEVERITY: How bad is the pain?   (Scale 0-10; or none, mild, moderate or severe)     Moderate 4. RECURRENT SYMPTOM: Have you ever had sinus problems before? If Yes, ask: When was the last time? and What happened that time?      Denies prior to December 6. NASAL DISCHARGE: Do you have discharge from your nose? If so ask, What color?     States constant dripping 7. FEVER: Do you have a fever? If Yes, ask: What is it, how was it measured, and when did it start?      Denies 8. OTHER SYMPTOMS: Do you have any other symptoms? (e.g., sore throat, cough, earache, difficulty breathing)     Denies  Protocols used: Sinus Pain or Congestion-A-AH Message from Antigo B sent at 05/19/2024  1:06 PM EST  Reason for Triage: patient is having a lot of headaches and has pressure behind her eyes that is causing pain

## 2024-05-20 NOTE — Telephone Encounter (Signed)
 NOTED

## 2024-05-22 ENCOUNTER — Other Ambulatory Visit: Payer: Self-pay | Admitting: Family

## 2024-05-22 ENCOUNTER — Encounter: Payer: Self-pay | Admitting: Family

## 2024-05-22 ENCOUNTER — Ambulatory Visit: Admitting: Family

## 2024-05-22 VITALS — BP 118/70 | HR 78 | Temp 98.2°F | Ht 67.0 in | Wt 186.6 lb

## 2024-05-22 DIAGNOSIS — J301 Allergic rhinitis due to pollen: Secondary | ICD-10-CM

## 2024-05-22 DIAGNOSIS — D500 Iron deficiency anemia secondary to blood loss (chronic): Secondary | ICD-10-CM

## 2024-05-22 DIAGNOSIS — R519 Headache, unspecified: Secondary | ICD-10-CM

## 2024-05-22 DIAGNOSIS — Z8639 Personal history of other endocrine, nutritional and metabolic disease: Secondary | ICD-10-CM

## 2024-05-22 DIAGNOSIS — E559 Vitamin D deficiency, unspecified: Secondary | ICD-10-CM

## 2024-05-22 DIAGNOSIS — E538 Deficiency of other specified B group vitamins: Secondary | ICD-10-CM

## 2024-05-22 DIAGNOSIS — R12 Heartburn: Secondary | ICD-10-CM

## 2024-05-22 LAB — B12 AND FOLATE PANEL
Folate: >23.4 ng/mL
Vitamin B-12: 523 pg/mL (ref 211–911)

## 2024-05-22 LAB — CBC
HCT: 36.6 % (ref 36.0–46.0)
Hemoglobin: 12 g/dL (ref 12.0–15.0)
MCHC: 32.9 g/dL (ref 30.0–36.0)
MCV: 86.8 fl (ref 78.0–100.0)
Platelets: 196 10*3/uL (ref 150.0–400.0)
RBC: 4.21 Mil/uL (ref 3.87–5.11)
RDW: 12.7 % (ref 11.5–15.5)
WBC: 3.9 10*3/uL — ABNORMAL LOW (ref 4.0–10.5)

## 2024-05-22 LAB — IBC + FERRITIN
Ferritin: 63.2 ng/mL (ref 10.0–291.0)
Iron: 100 ug/dL (ref 42–145)
Saturation Ratios: 30.5 % (ref 20.0–50.0)
TIBC: 327.6 ug/dL (ref 250.0–450.0)
Transferrin: 234 mg/dL (ref 212.0–360.0)

## 2024-05-22 LAB — C-REACTIVE PROTEIN: CRP: <0.5 mg/dL — ABNORMAL LOW (ref 1.0–20.0)

## 2024-05-22 LAB — VITAMIN D 25 HYDROXY (VIT D DEFICIENCY, FRACTURES): VITD: 23.52 ng/mL — ABNORMAL LOW (ref 30.00–100.00)

## 2024-05-22 LAB — SEDIMENTATION RATE: Sed Rate: 10 mm/h (ref 0–20)

## 2024-05-22 MED ORDER — PREDNISONE 10 MG (21) PO TBPK: ORAL_TABLET | ORAL | 0 refills | Status: AC

## 2024-05-22 MED ORDER — AZELASTINE HCL 0.1 % NA SOLN: 1.0000 | Freq: Two times a day (BID) | NASAL | 5 refills | Status: DC

## 2024-05-22 MED ORDER — OMEPRAZOLE 20 MG PO CPDR: 20.0000 mg | DELAYED_RELEASE_CAPSULE | Freq: Every day | ORAL | 0 refills | Status: AC

## 2024-05-22 NOTE — Progress Notes (Unsigned)
 "  Established Patient Office Visit  Subjective:      CC:  Chief Complaint  Patient presents with   Acute Visit    Still having issues with sinus problems. This started back at the beginning of December.    HPI: Elizabeth Williamson is a 34 y.o. female presenting on 05/22/2024 for Acute Visit (Still having issues with sinus problems. This started back at the beginning of December.) .  Discussed the use of AI scribe software for clinical note transcription with the patient, who gave verbal consent to proceed.  History of Present Illness          Social history:  Relevant past medical, surgical, family and social history reviewed and updated as indicated. Interim medical history since our last visit reviewed.  Allergies and medications reviewed and updated.  DATA REVIEWED: CHART IN EPIC     ROS: Negative unless specifically indicated above in HPI.   Current Medications[1]        Objective:        BP 118/70 (BP Location: Left Arm, Patient Position: Sitting, Cuff Size: Large)   Pulse 78   Temp 98.2 F (36.8 C) (Temporal)   Ht 5' 7 (1.702 m)   Wt 186 lb 9.6 oz (84.6 kg)   SpO2 98%   BMI 29.23 kg/m   Physical Exam   Wt Readings from Last 3 Encounters:  05/22/24 186 lb 9.6 oz (84.6 kg)  08/07/23 176 lb 3.2 oz (79.9 kg)  07/09/23 182 lb 8 oz (82.8 kg)    Physical Exam Vitals reviewed.  Constitutional:      General: She is not in acute distress.    Appearance: Normal appearance. She is normal weight. She is not ill-appearing, toxic-appearing or diaphoretic.  HENT:     Head: Normocephalic.     Right Ear: Tympanic membrane normal.     Left Ear: Tympanic membrane normal.     Nose: Nose normal.     Right Turbinates: Swollen.     Left Turbinates: Swollen.     Mouth/Throat:     Mouth: Mucous membranes are dry.     Pharynx: Postnasal drip present. No oropharyngeal exudate or posterior oropharyngeal erythema.  Eyes:     Extraocular Movements:  Extraocular movements intact.     Pupils: Pupils are equal, round, and reactive to light.  Cardiovascular:     Rate and Rhythm: Normal rate and regular rhythm.     Pulses: Normal pulses.     Heart sounds: Normal heart sounds.  Pulmonary:     Effort: Pulmonary effort is normal.     Breath sounds: Normal breath sounds.  Musculoskeletal:     Cervical back: Normal range of motion.  Neurological:     General: No focal deficit present.     Mental Status: She is alert and oriented to person, place, and time. Mental status is at baseline.  Psychiatric:        Mood and Affect: Mood normal.        Behavior: Behavior normal.        Thought Content: Thought content normal.        Judgment: Judgment normal.          Results   Assessment & Plan:   Assessment and Plan Assessment & Plan       Return in about 2 months (around 07/20/2024) for f/u sinus concerns .     Ginger Patrick, MSN, APRN, FNP-C [facility] Medicine        [  1]  Current Outpatient Medications:    azelastine  (ASTELIN ) 0.1 % nasal spray, Place 1 spray into both nostrils 2 (two) times daily., Disp: 9 mL, Rfl: 5   citalopram  (CELEXA ) 20 MG tablet, TAKE 1 TABLET BY MOUTH EVERY DAY, Disp: 90 tablet, Rfl: 0   levonorgestrel (MIRENA) 20 MCG/DAY IUD, 1 each by Intrauterine route once., Disp: , Rfl:    lubiprostone (AMITIZA) 24 MCG capsule, Take 24 mcg by mouth 2 (two) times daily with a meal., Disp: , Rfl:    Multiple Vitamin (MULTIVITAMIN WITH MINERALS) TABS tablet, Take 2 tablets by mouth daily., Disp: , Rfl:    omeprazole  (PRILOSEC) 20 MG capsule, Take 1 capsule (20 mg total) by mouth daily., Disp: 30 capsule, Rfl: 0   predniSONE  (STERAPRED UNI-PAK 21 TAB) 10 MG (21) TBPK tablet, Take as directed, Disp: 1 each, Rfl: 0   Ubrogepant  (UBRELVY ) 100 MG TABS, One po every day prn headache, Disp: 10 tablet, Rfl: 5  "

## 2024-09-01 ENCOUNTER — Ambulatory Visit: Admitting: Neurology
